# Patient Record
Sex: Female | Born: 1945 | Race: White | Hispanic: No | Marital: Married | Smoking: Never smoker
Health system: Southern US, Community
[De-identification: ages and names within clinical notes are randomized; demographics above are authoritative.]

## PROBLEM LIST (undated history)

## (undated) DIAGNOSIS — Z8619 Personal history of other infectious and parasitic diseases: Secondary | ICD-10-CM

## (undated) DIAGNOSIS — K2940 Chronic atrophic gastritis without bleeding: Secondary | ICD-10-CM

## (undated) DIAGNOSIS — Z79890 Hormone replacement therapy (postmenopausal): Secondary | ICD-10-CM

## (undated) DIAGNOSIS — M8580 Other specified disorders of bone density and structure, unspecified site: Secondary | ICD-10-CM

## (undated) DIAGNOSIS — U071 COVID-19: Secondary | ICD-10-CM

## (undated) DIAGNOSIS — Z9889 Other specified postprocedural states: Secondary | ICD-10-CM

## (undated) DIAGNOSIS — S060XAA Concussion with loss of consciousness status unknown, initial encounter: Secondary | ICD-10-CM

## (undated) DIAGNOSIS — S060X9A Concussion with loss of consciousness of unspecified duration, initial encounter: Secondary | ICD-10-CM

## (undated) DIAGNOSIS — K219 Gastro-esophageal reflux disease without esophagitis: Secondary | ICD-10-CM

## (undated) DIAGNOSIS — H269 Unspecified cataract: Secondary | ICD-10-CM

## (undated) DIAGNOSIS — M810 Age-related osteoporosis without current pathological fracture: Secondary | ICD-10-CM

## (undated) DIAGNOSIS — E785 Hyperlipidemia, unspecified: Secondary | ICD-10-CM

## (undated) DIAGNOSIS — M1990 Unspecified osteoarthritis, unspecified site: Secondary | ICD-10-CM

## (undated) HISTORY — PX: UPPER GI ENDOSCOPY: SHX6162

## (undated) HISTORY — DX: Other specified disorders of bone density and structure, unspecified site: M85.80

## (undated) HISTORY — DX: Hormone replacement therapy: Z79.890

## (undated) HISTORY — DX: Concussion with loss of consciousness of unspecified duration, initial encounter: S06.0X9A

## (undated) HISTORY — PX: EYE SURGERY: SHX253

## (undated) HISTORY — DX: Unspecified osteoarthritis, unspecified site: M19.90

## (undated) HISTORY — DX: Unspecified cataract: H26.9

## (undated) HISTORY — DX: Personal history of other infectious and parasitic diseases: Z86.19

## (undated) HISTORY — PX: TONSILLECTOMY: SUR1361

## (undated) HISTORY — DX: Hyperlipidemia, unspecified: E78.5

## (undated) HISTORY — DX: COVID-19: U07.1

## (undated) HISTORY — PX: LUMBAR DISC SURGERY: SHX700

## (undated) HISTORY — PX: TUBAL LIGATION: SHX77

## (undated) HISTORY — DX: Gastro-esophageal reflux disease without esophagitis: K21.9

## (undated) HISTORY — DX: Concussion with loss of consciousness status unknown, initial encounter: S06.0XAA

## (undated) HISTORY — DX: Chronic atrophic gastritis without bleeding: K29.40

## (undated) HISTORY — DX: Age-related osteoporosis without current pathological fracture: M81.0

---

## 2005-10-01 ENCOUNTER — Emergency Department: Payer: Self-pay | Admitting: Emergency Medicine

## 2005-10-05 ENCOUNTER — Emergency Department: Payer: Self-pay | Admitting: Emergency Medicine

## 2006-03-09 ENCOUNTER — Emergency Department: Payer: Self-pay | Admitting: Emergency Medicine

## 2006-09-24 ENCOUNTER — Ambulatory Visit: Payer: Self-pay

## 2007-09-21 ENCOUNTER — Encounter: Admission: RE | Admit: 2007-09-21 | Discharge: 2007-09-21 | Payer: Self-pay | Admitting: Occupational Medicine

## 2007-09-29 ENCOUNTER — Encounter: Admission: RE | Admit: 2007-09-29 | Discharge: 2007-10-08 | Payer: Self-pay | Admitting: Occupational Medicine

## 2007-10-19 ENCOUNTER — Ambulatory Visit (HOSPITAL_COMMUNITY): Admission: RE | Admit: 2007-10-19 | Discharge: 2007-10-19 | Payer: Self-pay | Admitting: Occupational Medicine

## 2007-11-23 ENCOUNTER — Ambulatory Visit: Payer: Self-pay

## 2007-12-11 ENCOUNTER — Ambulatory Visit: Payer: Self-pay | Admitting: Gastroenterology

## 2007-12-29 ENCOUNTER — Encounter: Admission: RE | Admit: 2007-12-29 | Discharge: 2008-01-25 | Payer: Self-pay | Admitting: Neurosurgery

## 2008-07-07 ENCOUNTER — Ambulatory Visit (HOSPITAL_COMMUNITY): Admission: RE | Admit: 2008-07-07 | Discharge: 2008-07-07 | Payer: Self-pay | Admitting: Neurosurgery

## 2008-08-18 ENCOUNTER — Encounter: Admission: RE | Admit: 2008-08-18 | Discharge: 2008-08-18 | Payer: Self-pay | Admitting: Neurosurgery

## 2008-11-24 ENCOUNTER — Ambulatory Visit: Payer: Self-pay

## 2010-11-07 ENCOUNTER — Ambulatory Visit: Payer: Self-pay | Admitting: Family Medicine

## 2011-07-22 LAB — CREATININE, SERUM: GFR calc Af Amer: >60

## 2011-11-07 ENCOUNTER — Ambulatory Visit: Payer: Self-pay | Admitting: Family Medicine

## 2011-11-07 DIAGNOSIS — M129 Arthropathy, unspecified: Secondary | ICD-10-CM | POA: Diagnosis not present

## 2011-11-07 DIAGNOSIS — R51 Headache: Secondary | ICD-10-CM | POA: Diagnosis not present

## 2011-11-07 DIAGNOSIS — M549 Dorsalgia, unspecified: Secondary | ICD-10-CM | POA: Diagnosis not present

## 2011-11-07 DIAGNOSIS — M542 Cervicalgia: Secondary | ICD-10-CM | POA: Diagnosis not present

## 2011-11-13 DIAGNOSIS — Z79890 Hormone replacement therapy (postmenopausal): Secondary | ICD-10-CM | POA: Diagnosis not present

## 2011-11-13 DIAGNOSIS — R87610 Atypical squamous cells of undetermined significance on cytologic smear of cervix (ASC-US): Secondary | ICD-10-CM | POA: Diagnosis not present

## 2011-11-13 DIAGNOSIS — Z0000 Encounter for general adult medical examination without abnormal findings: Secondary | ICD-10-CM | POA: Diagnosis not present

## 2011-11-13 DIAGNOSIS — Z01419 Encounter for gynecological examination (general) (routine) without abnormal findings: Secondary | ICD-10-CM | POA: Diagnosis not present

## 2011-11-14 ENCOUNTER — Ambulatory Visit: Payer: Self-pay

## 2011-11-14 DIAGNOSIS — Z1231 Encounter for screening mammogram for malignant neoplasm of breast: Secondary | ICD-10-CM | POA: Diagnosis not present

## 2011-11-14 DIAGNOSIS — Z01419 Encounter for gynecological examination (general) (routine) without abnormal findings: Secondary | ICD-10-CM | POA: Diagnosis not present

## 2011-11-14 DIAGNOSIS — R5381 Other malaise: Secondary | ICD-10-CM | POA: Diagnosis not present

## 2011-11-14 DIAGNOSIS — R928 Other abnormal and inconclusive findings on diagnostic imaging of breast: Secondary | ICD-10-CM | POA: Diagnosis not present

## 2011-11-14 DIAGNOSIS — E7800 Pure hypercholesterolemia, unspecified: Secondary | ICD-10-CM | POA: Diagnosis not present

## 2011-11-14 DIAGNOSIS — E559 Vitamin D deficiency, unspecified: Secondary | ICD-10-CM | POA: Diagnosis not present

## 2011-11-18 ENCOUNTER — Encounter: Payer: Self-pay | Admitting: Family Medicine

## 2011-11-22 ENCOUNTER — Encounter: Payer: Self-pay | Admitting: Family Medicine

## 2011-11-22 DIAGNOSIS — IMO0001 Reserved for inherently not codable concepts without codable children: Secondary | ICD-10-CM | POA: Diagnosis not present

## 2011-11-22 DIAGNOSIS — M545 Low back pain, unspecified: Secondary | ICD-10-CM | POA: Diagnosis not present

## 2011-11-22 DIAGNOSIS — R262 Difficulty in walking, not elsewhere classified: Secondary | ICD-10-CM | POA: Diagnosis not present

## 2011-12-03 DIAGNOSIS — R51 Headache: Secondary | ICD-10-CM | POA: Diagnosis not present

## 2011-12-03 DIAGNOSIS — M549 Dorsalgia, unspecified: Secondary | ICD-10-CM | POA: Diagnosis not present

## 2011-12-20 ENCOUNTER — Encounter: Payer: Self-pay | Admitting: Family Medicine

## 2011-12-26 DIAGNOSIS — M549 Dorsalgia, unspecified: Secondary | ICD-10-CM | POA: Diagnosis not present

## 2011-12-26 DIAGNOSIS — E559 Vitamin D deficiency, unspecified: Secondary | ICD-10-CM | POA: Diagnosis not present

## 2011-12-26 DIAGNOSIS — R51 Headache: Secondary | ICD-10-CM | POA: Diagnosis not present

## 2011-12-27 ENCOUNTER — Ambulatory Visit: Payer: Self-pay | Admitting: Family Medicine

## 2011-12-27 DIAGNOSIS — R51 Headache: Secondary | ICD-10-CM | POA: Diagnosis not present

## 2012-01-20 ENCOUNTER — Encounter: Payer: Self-pay | Admitting: Family Medicine

## 2012-02-19 ENCOUNTER — Encounter: Payer: Self-pay | Admitting: Family Medicine

## 2012-03-03 ENCOUNTER — Ambulatory Visit: Payer: Self-pay | Admitting: Family Medicine

## 2012-03-03 DIAGNOSIS — R51 Headache: Secondary | ICD-10-CM | POA: Diagnosis not present

## 2012-03-03 DIAGNOSIS — M5137 Other intervertebral disc degeneration, lumbosacral region: Secondary | ICD-10-CM | POA: Diagnosis not present

## 2012-03-03 DIAGNOSIS — E559 Vitamin D deficiency, unspecified: Secondary | ICD-10-CM | POA: Diagnosis not present

## 2012-03-03 DIAGNOSIS — M549 Dorsalgia, unspecified: Secondary | ICD-10-CM | POA: Diagnosis not present

## 2012-03-03 DIAGNOSIS — M545 Low back pain: Secondary | ICD-10-CM | POA: Diagnosis not present

## 2012-03-05 DIAGNOSIS — L821 Other seborrheic keratosis: Secondary | ICD-10-CM | POA: Diagnosis not present

## 2012-03-05 DIAGNOSIS — L819 Disorder of pigmentation, unspecified: Secondary | ICD-10-CM | POA: Diagnosis not present

## 2012-03-05 DIAGNOSIS — L820 Inflamed seborrheic keratosis: Secondary | ICD-10-CM | POA: Diagnosis not present

## 2012-03-05 DIAGNOSIS — L570 Actinic keratosis: Secondary | ICD-10-CM | POA: Diagnosis not present

## 2012-03-09 DIAGNOSIS — N951 Menopausal and female climacteric states: Secondary | ICD-10-CM | POA: Diagnosis not present

## 2012-03-09 DIAGNOSIS — M899 Disorder of bone, unspecified: Secondary | ICD-10-CM | POA: Diagnosis not present

## 2012-03-09 DIAGNOSIS — E2839 Other primary ovarian failure: Secondary | ICD-10-CM | POA: Diagnosis not present

## 2012-03-21 ENCOUNTER — Encounter: Payer: Self-pay | Admitting: Family Medicine

## 2012-04-20 ENCOUNTER — Encounter: Payer: Self-pay | Admitting: Family Medicine

## 2012-05-21 ENCOUNTER — Encounter: Payer: Self-pay | Admitting: Family Medicine

## 2012-06-29 DIAGNOSIS — M129 Arthropathy, unspecified: Secondary | ICD-10-CM | POA: Diagnosis not present

## 2012-06-29 DIAGNOSIS — Z23 Encounter for immunization: Secondary | ICD-10-CM | POA: Diagnosis not present

## 2012-06-29 DIAGNOSIS — M549 Dorsalgia, unspecified: Secondary | ICD-10-CM | POA: Diagnosis not present

## 2012-06-29 DIAGNOSIS — E559 Vitamin D deficiency, unspecified: Secondary | ICD-10-CM | POA: Diagnosis not present

## 2012-07-06 DIAGNOSIS — Z79899 Other long term (current) drug therapy: Secondary | ICD-10-CM | POA: Diagnosis not present

## 2012-07-06 DIAGNOSIS — R5381 Other malaise: Secondary | ICD-10-CM | POA: Diagnosis not present

## 2012-07-06 DIAGNOSIS — E559 Vitamin D deficiency, unspecified: Secondary | ICD-10-CM | POA: Diagnosis not present

## 2012-07-06 DIAGNOSIS — E7800 Pure hypercholesterolemia, unspecified: Secondary | ICD-10-CM | POA: Diagnosis not present

## 2012-07-06 DIAGNOSIS — R51 Headache: Secondary | ICD-10-CM | POA: Diagnosis not present

## 2012-07-06 DIAGNOSIS — R5383 Other fatigue: Secondary | ICD-10-CM | POA: Diagnosis not present

## 2012-08-05 DIAGNOSIS — L819 Disorder of pigmentation, unspecified: Secondary | ICD-10-CM | POA: Diagnosis not present

## 2012-08-05 DIAGNOSIS — L988 Other specified disorders of the skin and subcutaneous tissue: Secondary | ICD-10-CM | POA: Diagnosis not present

## 2012-08-05 DIAGNOSIS — D485 Neoplasm of uncertain behavior of skin: Secondary | ICD-10-CM | POA: Diagnosis not present

## 2012-08-05 DIAGNOSIS — L821 Other seborrheic keratosis: Secondary | ICD-10-CM | POA: Diagnosis not present

## 2012-12-24 DIAGNOSIS — Z01419 Encounter for gynecological examination (general) (routine) without abnormal findings: Secondary | ICD-10-CM | POA: Diagnosis not present

## 2012-12-24 DIAGNOSIS — Z79890 Hormone replacement therapy (postmenopausal): Secondary | ICD-10-CM | POA: Diagnosis not present

## 2012-12-24 DIAGNOSIS — M899 Disorder of bone, unspecified: Secondary | ICD-10-CM | POA: Diagnosis not present

## 2012-12-24 DIAGNOSIS — M949 Disorder of cartilage, unspecified: Secondary | ICD-10-CM | POA: Diagnosis not present

## 2013-01-21 ENCOUNTER — Ambulatory Visit: Payer: Self-pay

## 2013-01-21 DIAGNOSIS — Z1231 Encounter for screening mammogram for malignant neoplasm of breast: Secondary | ICD-10-CM | POA: Diagnosis not present

## 2013-01-26 DIAGNOSIS — Z0000 Encounter for general adult medical examination without abnormal findings: Secondary | ICD-10-CM | POA: Diagnosis not present

## 2013-01-26 DIAGNOSIS — Z1339 Encounter for screening examination for other mental health and behavioral disorders: Secondary | ICD-10-CM | POA: Diagnosis not present

## 2013-01-26 DIAGNOSIS — Z23 Encounter for immunization: Secondary | ICD-10-CM | POA: Diagnosis not present

## 2013-01-26 DIAGNOSIS — Z1331 Encounter for screening for depression: Secondary | ICD-10-CM | POA: Diagnosis not present

## 2013-03-10 DIAGNOSIS — L570 Actinic keratosis: Secondary | ICD-10-CM | POA: Diagnosis not present

## 2013-03-10 DIAGNOSIS — L819 Disorder of pigmentation, unspecified: Secondary | ICD-10-CM | POA: Diagnosis not present

## 2013-03-10 DIAGNOSIS — L821 Other seborrheic keratosis: Secondary | ICD-10-CM | POA: Diagnosis not present

## 2013-06-10 ENCOUNTER — Ambulatory Visit: Payer: Self-pay | Admitting: Gastroenterology

## 2013-06-10 DIAGNOSIS — K620 Anal polyp: Secondary | ICD-10-CM | POA: Diagnosis not present

## 2013-06-10 DIAGNOSIS — D128 Benign neoplasm of rectum: Secondary | ICD-10-CM | POA: Diagnosis not present

## 2013-06-10 DIAGNOSIS — Z8371 Family history of colonic polyps: Secondary | ICD-10-CM | POA: Diagnosis not present

## 2013-06-10 DIAGNOSIS — Z83719 Family history of colon polyps, unspecified: Secondary | ICD-10-CM | POA: Diagnosis not present

## 2013-06-10 DIAGNOSIS — K648 Other hemorrhoids: Secondary | ICD-10-CM | POA: Diagnosis not present

## 2013-06-10 DIAGNOSIS — Z1211 Encounter for screening for malignant neoplasm of colon: Secondary | ICD-10-CM | POA: Diagnosis not present

## 2013-06-10 DIAGNOSIS — K5730 Diverticulosis of large intestine without perforation or abscess without bleeding: Secondary | ICD-10-CM | POA: Diagnosis not present

## 2013-06-10 LAB — HM COLONOSCOPY

## 2013-06-11 LAB — PATHOLOGY REPORT

## 2013-07-07 DIAGNOSIS — H43819 Vitreous degeneration, unspecified eye: Secondary | ICD-10-CM | POA: Diagnosis not present

## 2013-07-07 DIAGNOSIS — H04129 Dry eye syndrome of unspecified lacrimal gland: Secondary | ICD-10-CM | POA: Diagnosis not present

## 2013-07-07 DIAGNOSIS — H259 Unspecified age-related cataract: Secondary | ICD-10-CM | POA: Diagnosis not present

## 2013-07-07 DIAGNOSIS — H16109 Unspecified superficial keratitis, unspecified eye: Secondary | ICD-10-CM | POA: Diagnosis not present

## 2013-08-10 DIAGNOSIS — Z23 Encounter for immunization: Secondary | ICD-10-CM | POA: Diagnosis not present

## 2013-08-10 DIAGNOSIS — E559 Vitamin D deficiency, unspecified: Secondary | ICD-10-CM | POA: Diagnosis not present

## 2013-08-10 DIAGNOSIS — M549 Dorsalgia, unspecified: Secondary | ICD-10-CM | POA: Diagnosis not present

## 2013-08-10 DIAGNOSIS — R5381 Other malaise: Secondary | ICD-10-CM | POA: Diagnosis not present

## 2013-08-10 DIAGNOSIS — Z79899 Other long term (current) drug therapy: Secondary | ICD-10-CM | POA: Diagnosis not present

## 2013-08-17 DIAGNOSIS — H52219 Irregular astigmatism, unspecified eye: Secondary | ICD-10-CM | POA: Diagnosis not present

## 2013-08-17 DIAGNOSIS — H35379 Puckering of macula, unspecified eye: Secondary | ICD-10-CM | POA: Diagnosis not present

## 2013-08-17 DIAGNOSIS — H259 Unspecified age-related cataract: Secondary | ICD-10-CM | POA: Diagnosis not present

## 2013-08-17 DIAGNOSIS — H04129 Dry eye syndrome of unspecified lacrimal gland: Secondary | ICD-10-CM | POA: Diagnosis not present

## 2013-08-31 DIAGNOSIS — H2510 Age-related nuclear cataract, unspecified eye: Secondary | ICD-10-CM | POA: Diagnosis not present

## 2013-08-31 DIAGNOSIS — H269 Unspecified cataract: Secondary | ICD-10-CM | POA: Diagnosis not present

## 2013-08-31 DIAGNOSIS — H25039 Anterior subcapsular polar age-related cataract, unspecified eye: Secondary | ICD-10-CM | POA: Diagnosis not present

## 2013-08-31 DIAGNOSIS — H25019 Cortical age-related cataract, unspecified eye: Secondary | ICD-10-CM | POA: Diagnosis not present

## 2013-08-31 DIAGNOSIS — Z9889 Other specified postprocedural states: Secondary | ICD-10-CM | POA: Diagnosis not present

## 2013-08-31 DIAGNOSIS — H25049 Posterior subcapsular polar age-related cataract, unspecified eye: Secondary | ICD-10-CM | POA: Diagnosis not present

## 2013-09-08 DIAGNOSIS — D485 Neoplasm of uncertain behavior of skin: Secondary | ICD-10-CM | POA: Diagnosis not present

## 2013-09-08 DIAGNOSIS — L821 Other seborrheic keratosis: Secondary | ICD-10-CM | POA: Diagnosis not present

## 2013-09-08 DIAGNOSIS — L819 Disorder of pigmentation, unspecified: Secondary | ICD-10-CM | POA: Diagnosis not present

## 2013-09-08 DIAGNOSIS — L908 Other atrophic disorders of skin: Secondary | ICD-10-CM | POA: Diagnosis not present

## 2013-12-29 DIAGNOSIS — N952 Postmenopausal atrophic vaginitis: Secondary | ICD-10-CM | POA: Diagnosis not present

## 2013-12-29 DIAGNOSIS — N951 Menopausal and female climacteric states: Secondary | ICD-10-CM | POA: Diagnosis not present

## 2013-12-29 DIAGNOSIS — Z01419 Encounter for gynecological examination (general) (routine) without abnormal findings: Secondary | ICD-10-CM | POA: Diagnosis not present

## 2013-12-30 DIAGNOSIS — H26499 Other secondary cataract, unspecified eye: Secondary | ICD-10-CM | POA: Diagnosis not present

## 2013-12-30 DIAGNOSIS — H2640 Unspecified secondary cataract: Secondary | ICD-10-CM | POA: Diagnosis not present

## 2014-01-19 DIAGNOSIS — L821 Other seborrheic keratosis: Secondary | ICD-10-CM | POA: Diagnosis not present

## 2014-01-19 DIAGNOSIS — L820 Inflamed seborrheic keratosis: Secondary | ICD-10-CM | POA: Diagnosis not present

## 2014-01-19 DIAGNOSIS — L578 Other skin changes due to chronic exposure to nonionizing radiation: Secondary | ICD-10-CM | POA: Diagnosis not present

## 2014-01-19 DIAGNOSIS — L918 Other hypertrophic disorders of the skin: Secondary | ICD-10-CM | POA: Diagnosis not present

## 2014-01-19 DIAGNOSIS — L819 Disorder of pigmentation, unspecified: Secondary | ICD-10-CM | POA: Diagnosis not present

## 2014-01-19 DIAGNOSIS — L908 Other atrophic disorders of skin: Secondary | ICD-10-CM | POA: Diagnosis not present

## 2014-01-19 DIAGNOSIS — L723 Sebaceous cyst: Secondary | ICD-10-CM | POA: Diagnosis not present

## 2014-02-22 DIAGNOSIS — Z23 Encounter for immunization: Secondary | ICD-10-CM | POA: Diagnosis not present

## 2014-02-22 DIAGNOSIS — Z79899 Other long term (current) drug therapy: Secondary | ICD-10-CM | POA: Diagnosis not present

## 2014-02-22 DIAGNOSIS — Z1331 Encounter for screening for depression: Secondary | ICD-10-CM | POA: Diagnosis not present

## 2014-02-22 DIAGNOSIS — M549 Dorsalgia, unspecified: Secondary | ICD-10-CM | POA: Diagnosis not present

## 2014-02-22 DIAGNOSIS — R5383 Other fatigue: Secondary | ICD-10-CM | POA: Diagnosis not present

## 2014-02-22 DIAGNOSIS — R5381 Other malaise: Secondary | ICD-10-CM | POA: Diagnosis not present

## 2014-02-22 DIAGNOSIS — Z1339 Encounter for screening examination for other mental health and behavioral disorders: Secondary | ICD-10-CM | POA: Diagnosis not present

## 2014-02-22 DIAGNOSIS — Z0000 Encounter for general adult medical examination without abnormal findings: Secondary | ICD-10-CM | POA: Diagnosis not present

## 2014-03-24 ENCOUNTER — Ambulatory Visit: Payer: Self-pay | Admitting: Family Medicine

## 2014-03-24 DIAGNOSIS — Z1231 Encounter for screening mammogram for malignant neoplasm of breast: Secondary | ICD-10-CM | POA: Diagnosis not present

## 2014-07-12 DIAGNOSIS — Z1331 Encounter for screening for depression: Secondary | ICD-10-CM | POA: Diagnosis not present

## 2014-07-12 DIAGNOSIS — Z23 Encounter for immunization: Secondary | ICD-10-CM | POA: Diagnosis not present

## 2014-07-12 DIAGNOSIS — Z1339 Encounter for screening examination for other mental health and behavioral disorders: Secondary | ICD-10-CM | POA: Diagnosis not present

## 2014-07-12 DIAGNOSIS — M549 Dorsalgia, unspecified: Secondary | ICD-10-CM | POA: Diagnosis not present

## 2014-07-12 DIAGNOSIS — Z0000 Encounter for general adult medical examination without abnormal findings: Secondary | ICD-10-CM | POA: Diagnosis not present

## 2014-07-21 DIAGNOSIS — L578 Other skin changes due to chronic exposure to nonionizing radiation: Secondary | ICD-10-CM | POA: Diagnosis not present

## 2014-07-21 DIAGNOSIS — L720 Epidermal cyst: Secondary | ICD-10-CM | POA: Diagnosis not present

## 2014-07-21 DIAGNOSIS — L700 Acne vulgaris: Secondary | ICD-10-CM | POA: Diagnosis not present

## 2014-08-12 DIAGNOSIS — H43813 Vitreous degeneration, bilateral: Secondary | ICD-10-CM | POA: Diagnosis not present

## 2014-08-12 DIAGNOSIS — H31001 Unspecified chorioretinal scars, right eye: Secondary | ICD-10-CM | POA: Diagnosis not present

## 2014-08-12 DIAGNOSIS — H2512 Age-related nuclear cataract, left eye: Secondary | ICD-10-CM | POA: Diagnosis not present

## 2014-08-12 DIAGNOSIS — H35372 Puckering of macula, left eye: Secondary | ICD-10-CM | POA: Diagnosis not present

## 2014-08-23 DIAGNOSIS — E559 Vitamin D deficiency, unspecified: Secondary | ICD-10-CM | POA: Diagnosis not present

## 2014-08-23 DIAGNOSIS — E780 Pure hypercholesterolemia: Secondary | ICD-10-CM | POA: Diagnosis not present

## 2014-08-23 DIAGNOSIS — G3184 Mild cognitive impairment, so stated: Secondary | ICD-10-CM | POA: Diagnosis not present

## 2014-08-23 DIAGNOSIS — M549 Dorsalgia, unspecified: Secondary | ICD-10-CM | POA: Diagnosis not present

## 2014-08-23 DIAGNOSIS — R5383 Other fatigue: Secondary | ICD-10-CM | POA: Diagnosis not present

## 2014-10-25 DIAGNOSIS — H25012 Cortical age-related cataract, left eye: Secondary | ICD-10-CM | POA: Diagnosis not present

## 2014-10-25 DIAGNOSIS — H2512 Age-related nuclear cataract, left eye: Secondary | ICD-10-CM | POA: Diagnosis not present

## 2014-10-25 DIAGNOSIS — H35372 Puckering of macula, left eye: Secondary | ICD-10-CM | POA: Diagnosis not present

## 2014-10-25 DIAGNOSIS — H25032 Anterior subcapsular polar age-related cataract, left eye: Secondary | ICD-10-CM | POA: Diagnosis not present

## 2014-10-25 DIAGNOSIS — H25042 Posterior subcapsular polar age-related cataract, left eye: Secondary | ICD-10-CM | POA: Diagnosis not present

## 2014-10-25 DIAGNOSIS — H25812 Combined forms of age-related cataract, left eye: Secondary | ICD-10-CM | POA: Diagnosis not present

## 2014-10-25 DIAGNOSIS — H35342 Macular cyst, hole, or pseudohole, left eye: Secondary | ICD-10-CM | POA: Diagnosis not present

## 2014-12-05 DIAGNOSIS — H35372 Puckering of macula, left eye: Secondary | ICD-10-CM | POA: Diagnosis not present

## 2015-01-10 DIAGNOSIS — G3184 Mild cognitive impairment, so stated: Secondary | ICD-10-CM | POA: Diagnosis not present

## 2015-01-10 DIAGNOSIS — J069 Acute upper respiratory infection, unspecified: Secondary | ICD-10-CM | POA: Diagnosis not present

## 2015-01-10 DIAGNOSIS — R05 Cough: Secondary | ICD-10-CM | POA: Diagnosis not present

## 2015-02-03 DIAGNOSIS — L818 Other specified disorders of pigmentation: Secondary | ICD-10-CM | POA: Diagnosis not present

## 2015-02-03 DIAGNOSIS — L820 Inflamed seborrheic keratosis: Secondary | ICD-10-CM | POA: Diagnosis not present

## 2015-02-03 DIAGNOSIS — L720 Epidermal cyst: Secondary | ICD-10-CM | POA: Diagnosis not present

## 2015-03-09 DIAGNOSIS — R05 Cough: Secondary | ICD-10-CM | POA: Diagnosis not present

## 2015-03-09 DIAGNOSIS — G3184 Mild cognitive impairment, so stated: Secondary | ICD-10-CM | POA: Diagnosis not present

## 2015-03-09 DIAGNOSIS — Z0000 Encounter for general adult medical examination without abnormal findings: Secondary | ICD-10-CM | POA: Diagnosis not present

## 2015-04-17 ENCOUNTER — Other Ambulatory Visit: Payer: Self-pay | Admitting: Family Medicine

## 2015-04-17 DIAGNOSIS — Z1231 Encounter for screening mammogram for malignant neoplasm of breast: Secondary | ICD-10-CM

## 2015-04-18 ENCOUNTER — Ambulatory Visit (INDEPENDENT_AMBULATORY_CARE_PROVIDER_SITE_OTHER): Payer: Medicare Other | Admitting: Obstetrics and Gynecology

## 2015-04-18 ENCOUNTER — Ambulatory Visit
Admission: RE | Admit: 2015-04-18 | Discharge: 2015-04-18 | Disposition: A | Discharge status: Home / Self Care | Payer: Medicare Other | Source: Ambulatory Visit | Attending: Family Medicine | Admitting: Family Medicine

## 2015-04-18 ENCOUNTER — Encounter: Payer: Self-pay | Admitting: Obstetrics and Gynecology

## 2015-04-18 ENCOUNTER — Other Ambulatory Visit: Payer: Self-pay | Admitting: Family Medicine

## 2015-04-18 VITALS — BP 109/67 | HR 60 | Ht 63.0 in | Wt 117.8 lb

## 2015-04-18 DIAGNOSIS — Z01419 Encounter for gynecological examination (general) (routine) without abnormal findings: Secondary | ICD-10-CM | POA: Diagnosis not present

## 2015-04-18 DIAGNOSIS — N951 Menopausal and female climacteric states: Secondary | ICD-10-CM | POA: Diagnosis not present

## 2015-04-18 DIAGNOSIS — Z79890 Hormone replacement therapy (postmenopausal): Secondary | ICD-10-CM

## 2015-04-18 DIAGNOSIS — Z8742 Personal history of other diseases of the female genital tract: Secondary | ICD-10-CM | POA: Insufficient documentation

## 2015-04-18 DIAGNOSIS — Z1239 Encounter for other screening for malignant neoplasm of breast: Secondary | ICD-10-CM | POA: Diagnosis not present

## 2015-04-18 DIAGNOSIS — Z1231 Encounter for screening mammogram for malignant neoplasm of breast: Secondary | ICD-10-CM

## 2015-04-18 MED ORDER — CONJ ESTROG-MEDROXYPROGEST ACE 0.3-1.5 MG PO TABS: 1.0000 | ORAL_TABLET | Freq: Every day | ORAL | Status: DC

## 2015-04-18 NOTE — Progress Notes (Signed)
GYN Annual Exam  Subjective:    Alexis Medina is a 69 y.o. G66P1011 female who presents to establish care, and for annual exam. The patient has no complaints today. The patient is not sexually active. GYN screening history: last pap: approximate date 3 years ago and was normal and last mammogram: approximate date 2015 and was normal. The patient is taking hormone replacement therapy. Patient denies post-menopausal vaginal bleeding (however does have a history of PMB ~ 5 years ago, with benign workup) . The patient wears seatbelts: yes. The patient participates in regular exercise: no. Has the patient ever been transfused or tattooed?: no. The patient reports that there is not domestic violence in her life.   Menstrual History: Obstetric History   G2   P1   T1   P0   A1   TAB0   SAB1   E0   M0   L1     # Outcome Date GA Lbr Len/2nd Weight Sex Delivery Anes PTL Lv  2 Term 11/17/82   7 lb 10 oz (3.459 kg) M Vag-Spont   Y  1 SAB      SAB   FD     Menarche age: 39 or 64 (patient cannot recall exactly)  No LMP recorded. Patient is postmenopausal. Last colonoscopy: 2015, 1 polyp noted but otherwise normal.   Review of Systems Constitutional: negative for chills, fatigue, fevers and sweats Eyes: negative for irritation, redness and visual disturbance Ears, nose, mouth, throat, and face: negative for hearing loss, nasal congestion, snoring and tinnitus Respiratory: negative for asthma, cough, sputum Cardiovascular: negative for chest pain, dyspnea, exertional chest pressure/discomfort, irregular heart beat, palpitations and syncope Gastrointestinal: negative for abdominal pain, change in bowel habits, nausea and vomiting Genitourinary: negative for abnormal menstrual periods, genital lesions, sexual problems and vaginal discharge, dysuria and urinary incontinence Integument/breast: negative for breast lump, breast tenderness and nipple discharge Hematologic/lymphatic: negative for bleeding and  easy bruising Musculoskeletal:negative for back pain and muscle weakness Neurological: negative for dizziness, headaches, vertigo and weakness Endocrine: negative for diabetic symptoms including polydipsia, polyuria and skin dryness Allergic/Immunologic: negative for hay fever and urticaria     Objective:    BP 109/67 mmHg  Pulse 60  Ht 5\' 3"  (1.6 m)  Wt 117 lb 12.8 oz (53.434 kg)  BMI 20.87 kg/m2  General Appearance:    Alert, cooperative, no distress, appears stated age  Head:    Normocephalic, without obvious abnormality, atraumatic  Eyes:    PERRL, conjunctiva/corneas clear, EOM's intact, both eyes  Ears:    Normal external ear canals, both ears  Nose:   Nares normal, septum midline, mucosa normal, no drainage or sinus tenderness  Throat:   Lips, mucosa, and tongue normal; teeth and gums normal  Neck:   Supple, symmetrical, trachea midline, no adenopathy; thyroid: no enlargement/tenderness/nodules; no carotid   bruit or JVD  Back:     Symmetric, no curvature, ROM normal, no CVA tenderness  Lungs:     Clear to auscultation bilaterally, respirations unlabored  Chest Wall:    No tenderness or deformity   Heart:    Regular rate and rhythm, S1 and S2 normal, no murmur, rub or gallop  Breast Exam:    No tenderness, masses, or nipple abnormality  Abdomen:     Soft, non-tender, bowel sounds active all four quadrants, no masses, no organomegaly  Genitalia:    Normal female without lesion, discharge or tenderness  Rectal:    Normal external sphincter. No hemmorhoids  present. Internal exam not done.   Extremities:   Extremities normal, atraumatic, no cyanosis or edema  Pulses:   2+ and symmetric all extremities  Skin:   Skin color with multiple hyperpigmented areas around neck/face and legs (sun damage).  Texture, turgor normal, no rashes or lesions.  Lymph nodes:   Cervical, supraclavicular, and axillary nodes normal  Neurologic:   CNII-XII intact, normal strength, sensation and reflexes     throughout      Assessment:    Normal gyn exam Hormone replacement therapy Menopause    Plan:    All questions answered. Breast self exam technique reviewed and patient encouraged to perform self-exam monthly. Discussed healthy lifestyle modifications. Mammogram.   Menopause Management : Non-hormonal Menopausal Symptom management strategies reviewed. Hormone Replacement Therapy benefits and risks reviewed. Patient reports attempting to wean 3 or 4 times before in the past with worsening rebound symptoms. The patient is accepting of all risks of continued HRT use. Hormone Replacement Therapy is refilled.  Advised to alert MD of any episodes of vaginal bleeding.  Sees Dermatologist annually.  RTC in 1 year for medication refill and in 2 years for next annual exam (Medicare coverage).     Rubie Maid, MD [facility] 04/18/2015 3:53 PM

## 2015-04-19 ENCOUNTER — Telehealth: Payer: Self-pay | Admitting: Obstetrics and Gynecology

## 2015-04-19 DIAGNOSIS — Z79890 Hormone replacement therapy (postmenopausal): Principal | ICD-10-CM

## 2015-04-19 DIAGNOSIS — N951 Menopausal and female climacteric states: Secondary | ICD-10-CM

## 2015-04-19 NOTE — Telephone Encounter (Signed)
[facility] but it was sent in for 30day supply but she needs it to be the 90 days supply in order for insurance to pay for it. Can you resend in new rx for pt

## 2015-04-20 ENCOUNTER — Other Ambulatory Visit: Payer: Self-pay

## 2015-04-20 MED ORDER — IBUPROFEN 800 MG PO TABS: 800.0000 mg | ORAL_TABLET | Freq: Three times a day (TID) | ORAL | Status: DC | PRN

## 2015-04-20 NOTE — Telephone Encounter (Signed)
Fax from [employer]. This is Dr. Alben Spittle patient thank you=aa

## 2015-04-21 MED ORDER — CONJ ESTROG-MEDROXYPROGEST ACE 0.3-1.5 MG PO TABS: 1.0000 | ORAL_TABLET | Freq: Every day | ORAL | Status: DC

## 2015-04-21 NOTE — Telephone Encounter (Signed)
Please inform pt that 90 supply was sent in and apologize for the inconvenience. Thanks

## 2015-04-25 NOTE — Telephone Encounter (Signed)
PT AWARE  

## 2015-05-04 DIAGNOSIS — H35372 Puckering of macula, left eye: Secondary | ICD-10-CM | POA: Diagnosis not present

## 2015-05-04 DIAGNOSIS — H43813 Vitreous degeneration, bilateral: Secondary | ICD-10-CM | POA: Diagnosis not present

## 2015-05-04 DIAGNOSIS — H35413 Lattice degeneration of retina, bilateral: Secondary | ICD-10-CM | POA: Diagnosis not present

## 2015-05-23 ENCOUNTER — Encounter: Payer: Medicare Other | Admitting: Obstetrics and Gynecology

## 2015-08-19 ENCOUNTER — Ambulatory Visit (INDEPENDENT_AMBULATORY_CARE_PROVIDER_SITE_OTHER): Payer: Medicare Other

## 2015-08-19 DIAGNOSIS — Z23 Encounter for immunization: Secondary | ICD-10-CM | POA: Diagnosis not present

## 2015-11-23 DIAGNOSIS — H35372 Puckering of macula, left eye: Secondary | ICD-10-CM | POA: Diagnosis not present

## 2015-11-23 DIAGNOSIS — H43813 Vitreous degeneration, bilateral: Secondary | ICD-10-CM | POA: Diagnosis not present

## 2015-12-08 DIAGNOSIS — H0012 Chalazion right lower eyelid: Secondary | ICD-10-CM | POA: Diagnosis not present

## 2015-12-08 DIAGNOSIS — H04123 Dry eye syndrome of bilateral lacrimal glands: Secondary | ICD-10-CM | POA: Diagnosis not present

## 2015-12-08 DIAGNOSIS — H26492 Other secondary cataract, left eye: Secondary | ICD-10-CM | POA: Diagnosis not present

## 2015-12-08 DIAGNOSIS — H43813 Vitreous degeneration, bilateral: Secondary | ICD-10-CM | POA: Diagnosis not present

## 2015-12-13 DIAGNOSIS — H26492 Other secondary cataract, left eye: Secondary | ICD-10-CM | POA: Diagnosis not present

## 2016-01-18 DIAGNOSIS — H04122 Dry eye syndrome of left lacrimal gland: Secondary | ICD-10-CM | POA: Diagnosis not present

## 2016-01-18 DIAGNOSIS — H04121 Dry eye syndrome of right lacrimal gland: Secondary | ICD-10-CM | POA: Diagnosis not present

## 2016-03-06 DIAGNOSIS — L820 Inflamed seborrheic keratosis: Secondary | ICD-10-CM | POA: Diagnosis not present

## 2016-03-06 DIAGNOSIS — L988 Other specified disorders of the skin and subcutaneous tissue: Secondary | ICD-10-CM | POA: Diagnosis not present

## 2016-03-06 DIAGNOSIS — L814 Other melanin hyperpigmentation: Secondary | ICD-10-CM | POA: Diagnosis not present

## 2016-03-06 DIAGNOSIS — L821 Other seborrheic keratosis: Secondary | ICD-10-CM | POA: Diagnosis not present

## 2016-04-08 ENCOUNTER — Other Ambulatory Visit: Payer: Self-pay | Admitting: Family Medicine

## 2016-04-18 ENCOUNTER — Encounter: Payer: Self-pay | Admitting: Obstetrics and Gynecology

## 2016-04-18 ENCOUNTER — Ambulatory Visit (INDEPENDENT_AMBULATORY_CARE_PROVIDER_SITE_OTHER): Payer: Medicare Other | Admitting: Obstetrics and Gynecology

## 2016-04-18 VITALS — BP 122/72 | HR 66 | Ht 63.0 in | Wt 119.6 lb

## 2016-04-18 DIAGNOSIS — Z79890 Hormone replacement therapy (postmenopausal): Secondary | ICD-10-CM

## 2016-04-18 DIAGNOSIS — Z1239 Encounter for other screening for malignant neoplasm of breast: Secondary | ICD-10-CM

## 2016-04-18 DIAGNOSIS — N950 Postmenopausal bleeding: Secondary | ICD-10-CM | POA: Diagnosis not present

## 2016-04-20 NOTE — Progress Notes (Signed)
   GYNECOLOGY CLINIC PROGRESS NOTE  Subjective:    Alexis Medina is a 70 y.o. G58P1011  post-menopausal female who presents for concerns regarding vaginal bleeding. She has been menopausal for over 15 years. Currently on continuous HRT (Prempro) and has been on this regimen for several years. Bleeding is described as spotting and has occurred several times over 4-5 weeks.  Has had no further bleeding in the past 2-3 weeks.  Thinks it may be due to stress as her husband has recently had to learn to self cath, and she has had to help.  Other menopausal symptoms include: occasional intermittent pelvic cramping.   Patient notes that she has had a h/o PMB in the past (~ 4-5 years ago with benign workup).  Reports having tried to wean from HRT several months ago (at recommendations from PCP), got down to q 3 days of taking tablets but began noting moderate symptoms of hot flushes, mood changes, so resumed daily dosing.     The following portions of the patient's history were reviewed and updated as appropriate: allergies, current medications, past family history, past medical history, past social history, past surgical history and problem list.  Review of Systems Pertinent items noted in HPI and remainder of comprehensive ROS otherwise negative.    Objective:    BP 122/72 mmHg  Pulse 66  Ht 5\' 3"  (1.6 m)  Wt 119 lb 9.6 oz (54.25 kg)  BMI 21.19 kg/m2  General appearance: alert and no distress Abdomen: soft, non-tender; bowel sounds normal; no masses,  no organomegaly  Breasts: breasts appear normal, no suspicious masses, no skin or nipple changes or axillary nodes. Pelvic: external genitalia normal, rectovaginal septum normal.  Vagina with small amount of thin white discharge without odor. No blood in vaginal vault. Mild vaginal atrophy present.  Cervix normal appearing, no lesions and no motion tenderness, stenotic os.  Uterus mobile, nontender, normal shape and size.  Adnexae non-palpable,  nontender bilaterally.  Extremities: extremities normal, atraumatic, no cyanosis or edema Neurologic: Grossly normal   Assessment:    Postmenopausal bleeding   Currently on HRT. Need for mammogram  Plan:    Diagnosis explained in detail, including differential.    Discussed need for further evaluation with ultrasound, and possible endometrial biopsy based on results.  To have ultrasound within 1 week. Will notify patient of ultrasound results by phone.   Patient to hold taking HRT medication until evaluation complete.  Patient notes that if she is able to resume HRT, she will need a refill.  Patient notes that she needs a mammogram this year.  Will order.    Rubie Maid, MD [facility]

## 2016-04-24 ENCOUNTER — Telehealth: Payer: Self-pay

## 2016-04-24 NOTE — Telephone Encounter (Signed)
[facility] for refill on Tramadol 50 mg 1 PO BID prn

## 2016-04-25 ENCOUNTER — Other Ambulatory Visit: Payer: Self-pay

## 2016-04-25 MED ORDER — TRAMADOL HCL 50 MG PO TABS: 50.0000 mg | ORAL_TABLET | Freq: Two times a day (BID) | ORAL | Status: DC | PRN

## 2016-04-25 NOTE — Telephone Encounter (Signed)
Ok--3 months--1 rf.

## 2016-04-26 ENCOUNTER — Ambulatory Visit (INDEPENDENT_AMBULATORY_CARE_PROVIDER_SITE_OTHER): Payer: Medicare Other

## 2016-04-26 DIAGNOSIS — N950 Postmenopausal bleeding: Secondary | ICD-10-CM | POA: Diagnosis not present

## 2016-05-06 ENCOUNTER — Telehealth: Payer: Self-pay | Admitting: Obstetrics and Gynecology

## 2016-05-06 DIAGNOSIS — Z79890 Hormone replacement therapy (postmenopausal): Principal | ICD-10-CM

## 2016-05-06 DIAGNOSIS — N951 Menopausal and female climacteric states: Secondary | ICD-10-CM

## 2016-05-06 NOTE — Telephone Encounter (Signed)
7/7 this pt had a vag [place] and she was calling for results

## 2016-05-08 NOTE — Telephone Encounter (Signed)
Patient called back requesting u/s results. She can be reached on her cell [phone].Thanks

## 2016-05-09 NOTE — Telephone Encounter (Signed)
pt has called 3 times now and not got a call back about her results. She said she wants to know results and she said that you were going to put her back on primpro.

## 2016-05-09 NOTE — Telephone Encounter (Signed)
Please advise 

## 2016-05-10 MED ORDER — CONJ ESTROG-MEDROXYPROGEST ACE 0.3-1.5 MG PO TABS: 1.0000 | ORAL_TABLET | Freq: Every day | ORAL | Status: DC

## 2016-05-10 NOTE — Telephone Encounter (Signed)
Contacted patient, informed her of results of ultrasound (benign findings, no concerns for malignancy with episode of PMB).  She can resume her Prempro.  Needs refill. 3 month supply with refills sent through [facility].    Rubie Maid, MD [facility]

## 2016-05-10 NOTE — Telephone Encounter (Signed)
Yes.  Please apologize to her for me.  I was planning to get back with her after clinic yesterday but had to leave right after for a delivery.  Her ultrasound was normal, lining of uterus was not thickened, and she had 2 very small fibroids that should not be causing problems.  She can resume her Prempro.    Rubie Maid, MD [facility]

## 2016-05-14 ENCOUNTER — Other Ambulatory Visit: Payer: Self-pay | Admitting: Family Medicine

## 2016-05-14 DIAGNOSIS — Z1231 Encounter for screening mammogram for malignant neoplasm of breast: Secondary | ICD-10-CM

## 2016-05-28 ENCOUNTER — Ambulatory Visit
Admission: RE | Admit: 2016-05-28 | Discharge: 2016-05-28 | Disposition: A | Discharge status: Home / Self Care | Payer: Medicare Other | Source: Ambulatory Visit | Attending: Family Medicine | Admitting: Family Medicine

## 2016-05-28 ENCOUNTER — Other Ambulatory Visit: Payer: Self-pay | Admitting: Family Medicine

## 2016-05-28 DIAGNOSIS — Z1231 Encounter for screening mammogram for malignant neoplasm of breast: Secondary | ICD-10-CM | POA: Insufficient documentation

## 2016-06-12 ENCOUNTER — Other Ambulatory Visit: Payer: Self-pay

## 2016-06-12 MED ORDER — IBUPROFEN 800 MG PO TABS: 800.0000 mg | ORAL_TABLET | Freq: Three times a day (TID) | ORAL | 3 refills | Status: DC | PRN

## 2016-06-12 NOTE — Telephone Encounter (Signed)
Refill request from [facility] for refill on Ibuprofen-aa

## 2016-06-18 ENCOUNTER — Other Ambulatory Visit: Payer: Self-pay

## 2016-07-10 ENCOUNTER — Ambulatory Visit (INDEPENDENT_AMBULATORY_CARE_PROVIDER_SITE_OTHER): Payer: Medicare Other | Admitting: Family Medicine

## 2016-07-10 ENCOUNTER — Encounter: Payer: Self-pay | Admitting: Family Medicine

## 2016-07-10 VITALS — BP 136/76 | HR 60 | Temp 97.8°F | Resp 16 | Wt 120.0 lb

## 2016-07-10 DIAGNOSIS — K219 Gastro-esophageal reflux disease without esophagitis: Secondary | ICD-10-CM

## 2016-07-10 DIAGNOSIS — M545 Low back pain, unspecified: Secondary | ICD-10-CM

## 2016-07-10 DIAGNOSIS — R5383 Other fatigue: Secondary | ICD-10-CM | POA: Diagnosis not present

## 2016-07-10 DIAGNOSIS — M79641 Pain in right hand: Secondary | ICD-10-CM

## 2016-07-10 DIAGNOSIS — M1990 Unspecified osteoarthritis, unspecified site: Secondary | ICD-10-CM

## 2016-07-10 DIAGNOSIS — R079 Chest pain, unspecified: Secondary | ICD-10-CM

## 2016-07-10 MED ORDER — OMEPRAZOLE 20 MG PO CPDR: 20.0000 mg | DELAYED_RELEASE_CAPSULE | Freq: Every day | ORAL | 5 refills | Status: DC

## 2016-07-10 NOTE — Progress Notes (Signed)
Patient: Alexis Medina Female    DOB: 02/04/1946   70 y.o.   MRN: [id number] Visit Date: 07/10/2016  Today's Provider: [name], MD   Chief Complaint  Patient presents with  . Hand Pain    Left thumb comes and goes/ Swelling some  . Back Pain    History of DDD L4-L5; Steadly worsening.  Not doing to exercises as much.   . Fatigue    For about 2-3 weeks  . Chest Pain    Worse when stressed/ anxious; but now happening everyday.     Subjective:    Back Pain  This is a chronic problem. The problem has been gradually worsening since onset. The pain is present in the lumbar spine. The pain does not radiate. Exacerbated by: Standing or sitting too long.  Associated symptoms include chest pain. Pertinent negatives include no abdominal pain, fever, headaches, numbness or weakness. She has tried NSAIDs (Tramadol) for the symptoms. The treatment provided significant relief.  Hand Pain   The incident occurred more than 1 week ago. There was no injury mechanism. The pain is present in the left hand (Mostly in her thumb area.). The quality of the pain is described as aching and stabbing. The pain does not radiate. Associated symptoms include chest pain. Pertinent negatives include no numbness. The symptoms are aggravated by movement and palpation. She has tried NSAIDs for the symptoms. The treatment provided moderate relief.  Chest Pain   This is a new problem. The current episode started in the past 7 days. The onset quality is gradual. The problem has been gradually worsening. The quality of the pain is described as pressure, squeezing and crushing. The pain does not radiate. Associated symptoms include back pain and nausea. Pertinent negatives include no abdominal pain, cough, diaphoresis, dizziness, fever, headaches, numbness, palpitations, shortness of breath, vomiting or weakness.  Pertinent negatives for past medical history include no seizures.      No Known  Allergies   Current Outpatient Prescriptions:  .  cholecalciferol (VITAMIN D) 1000 UNITS tablet, Take 1,000 Units by mouth daily., Disp: , Rfl:  .  estrogen, conjugated,-medroxyprogesterone (PREMPRO) 0.3-1.5 MG tablet, Take 1 tablet by mouth daily., Disp: 90 tablet, Rfl: 4 .  ibuprofen (ADVIL,MOTRIN) 800 MG tablet, Take 1 tablet (800 mg total) by mouth every 8 (eight) hours as needed., Disp: 90 tablet, Rfl: 3 .  Multiple Vitamin (MULTIVITAMIN) capsule, Take 1 capsule by mouth daily., Disp: , Rfl:  .  sertraline (ZOLOFT) 50 MG tablet, TAKE 1 TABLET DAILY AS DIRECTED, Disp: , Rfl:  .  tazarotene (AVAGE) 0.1 % cream, , Disp: , Rfl:  .  traMADol (ULTRAM) 50 MG tablet, Take 1 tablet (50 mg total) by mouth 2 (two) times daily as needed., Disp: 180 tablet, Rfl: 1 .  VAYACOG 100-19.5-6.5 MG CAPS, TAKE ONE CAPSULE BY MOUTH ONCE DAILY, Disp: 30 capsule, Rfl: 12  Review of Systems  Constitutional: Positive for activity change and fatigue. Negative for appetite change, chills, diaphoresis, fever and unexpected weight change.  Respiratory: Negative for apnea, cough, choking, chest tightness, shortness of breath, wheezing and stridor.   Cardiovascular: Positive for chest pain. Negative for palpitations and leg swelling.  Gastrointestinal: Positive for nausea. Negative for abdominal distention, abdominal pain, anal bleeding, blood in stool, constipation, diarrhea, rectal pain and vomiting.  Endocrine: Negative for cold intolerance, heat intolerance, polydipsia, polyphagia and polyuria.  Musculoskeletal: Positive for arthralgias, back pain, joint swelling and myalgias. Negative  for gait problem, neck pain and neck stiffness.  Neurological: Positive for tremors (Hands tremor in the mornings; but this is a chronic issue. ). Negative for dizziness, seizures, syncope, speech difficulty, weakness, light-headedness, numbness and headaches.  Psychiatric/Behavioral: Positive for sleep disturbance (Has been getting up  several times a night. ). Negative for agitation, behavioral problems, confusion, decreased concentration, dysphoric mood, hallucinations, self-injury and suicidal ideas. The patient is not nervous/anxious and is not hyperactive.     Social History  Substance Use Topics  . Smoking status: Never Smoker  . Smokeless tobacco: Never Used  . Alcohol use No   Objective:   BP 136/76 (BP Location: Right Arm, Patient Position: Sitting, Cuff Size: Normal)   Pulse 60   Temp 97.8 F (36.6 C) (Oral)   Resp 16   Wt 120 lb (54.4 kg)   BMI 21.26 kg/m   Physical Exam  Constitutional: She is oriented to person, place, and time. She appears well-developed and well-nourished.  HENT:  Head: Normocephalic and atraumatic.  Right Ear: External ear normal.  Left Ear: External ear normal.  Nose: Nose normal.  Eyes: Conjunctivae are normal.  Neck: No thyromegaly present.  Cardiovascular: Normal rate, regular rhythm, normal heart sounds and intact distal pulses.   Pulmonary/Chest: Effort normal and breath sounds normal.  Abdominal: Soft. Bowel sounds are normal.  Lymphadenopathy:    She has no cervical adenopathy.  Neurological: She is alert and oriented to person, place, and time. She has normal reflexes.  Skin: Skin is warm and dry.  Psychiatric: She has a normal mood and affect. Her behavior is normal. Judgment and thought content normal.        Assessment & Plan:     1. Other fatigue  - EKG 12-Lead - Comprehensive metabolic panel - TSH - CBC with Differential/Platelet  2. Pain of right hand   3. Chest pain, unspecified chest pain type  - EKG 12-Lead - DG Chest 2 View; Future - Lipid panel - Comprehensive metabolic panel - TSH  4. Midline low back pain without sciatica   5. Gastroesophageal reflux disease without esophagitis  - omeprazole (PRILOSEC) 20 MG capsule; Take 1 capsule (20 mg total) by mouth daily.  Dispense: 30 capsule; Refill: 5  6. Arthritis  - DG Hand  Complete Left; Future - Sed Rate (ESR)      I have done the exam and reviewed the above chart and it is accurate to the best of my knowledge.  Jerris Keltz Cranford Mon, MD  [facility]

## 2016-07-11 DIAGNOSIS — R5383 Other fatigue: Secondary | ICD-10-CM | POA: Diagnosis not present

## 2016-07-11 DIAGNOSIS — M1990 Unspecified osteoarthritis, unspecified site: Secondary | ICD-10-CM | POA: Diagnosis not present

## 2016-07-11 DIAGNOSIS — R079 Chest pain, unspecified: Secondary | ICD-10-CM | POA: Diagnosis not present

## 2016-07-12 ENCOUNTER — Ambulatory Visit
Admission: RE | Admit: 2016-07-12 | Discharge: 2016-07-12 | Disposition: A | Discharge status: Home / Self Care | Payer: Medicare Other | Source: Ambulatory Visit | Attending: Family Medicine | Admitting: Family Medicine

## 2016-07-12 ENCOUNTER — Telehealth: Payer: Self-pay

## 2016-07-12 DIAGNOSIS — M1812 Unilateral primary osteoarthritis of first carpometacarpal joint, left hand: Secondary | ICD-10-CM | POA: Insufficient documentation

## 2016-07-12 DIAGNOSIS — R079 Chest pain, unspecified: Secondary | ICD-10-CM | POA: Insufficient documentation

## 2016-07-12 DIAGNOSIS — M1990 Unspecified osteoarthritis, unspecified site: Secondary | ICD-10-CM

## 2016-07-12 LAB — CBC WITH DIFFERENTIAL/PLATELET
Basophils Absolute: 0 10*3/uL (ref 0.0–0.2)
Basos: 1 %
EOS (ABSOLUTE): 0.1 10*3/uL (ref 0.0–0.4)
EOS: 2 %
HEMATOCRIT: 37.7 % (ref 34.0–46.6)
Hemoglobin: 12.8 g/dL (ref 11.1–15.9)
IMMATURE GRANULOCYTES: 0 %
Immature Grans (Abs): 0 10*3/uL (ref 0.0–0.1)
LYMPHS ABS: 2.6 10*3/uL (ref 0.7–3.1)
Lymphs: 45 %
MCH: 30.7 pg (ref 26.6–33.0)
MCHC: 34 g/dL (ref 31.5–35.7)
MCV: 90 fL (ref 79–97)
MONOS ABS: 0.4 10*3/uL (ref 0.1–0.9)
Monocytes: 8 %
NEUTROS PCT: 44 %
Neutrophils Absolute: 2.5 10*3/uL (ref 1.4–7.0)
PLATELETS: 232 10*3/uL (ref 150–379)
RBC: 4.17 x10E6/uL (ref 3.77–5.28)
RDW: 13.7 % (ref 12.3–15.4)
WBC: 5.6 10*3/uL (ref 3.4–10.8)

## 2016-07-12 LAB — COMPREHENSIVE METABOLIC PANEL
A/G RATIO: 1.6 (ref 1.2–2.2)
ALK PHOS: 53 IU/L (ref 39–117)
ALT: 15 IU/L (ref 0–32)
AST: 27 IU/L (ref 0–40)
Albumin: 4.3 g/dL (ref 3.5–4.8)
BILIRUBIN TOTAL: 0.6 mg/dL (ref 0.0–1.2)
BUN/Creatinine Ratio: 11 — ABNORMAL LOW (ref 12–28)
BUN: 9 mg/dL (ref 8–27)
CALCIUM: 9.6 mg/dL (ref 8.7–10.3)
CHLORIDE: 97 mmol/L (ref 96–106)
CO2: 22 mmol/L (ref 18–29)
Creatinine, Ser: 0.84 mg/dL (ref 0.57–1.00)
GFR calc Af Amer: 81 mL/min/{1.73_m2} (ref >59)
GFR, EST NON AFRICAN AMERICAN: 71 mL/min/{1.73_m2} (ref >59)
GLOBULIN, TOTAL: 2.7 g/dL (ref 1.5–4.5)
Glucose: 75 mg/dL (ref 65–99)
POTASSIUM: 4.5 mmol/L (ref 3.5–5.2)
SODIUM: 136 mmol/L (ref 134–144)
Total Protein: 7 g/dL (ref 6.0–8.5)

## 2016-07-12 LAB — TSH: TSH: 1.52 u[IU]/mL (ref 0.450–4.500)

## 2016-07-12 LAB — LIPID PANEL
CHOL/HDL RATIO: 2.2 ratio (ref 0.0–4.4)
CHOLESTEROL TOTAL: 215 mg/dL — AB (ref 100–199)
HDL: 97 mg/dL (ref >39)
LDL CALC: 107 mg/dL — AB (ref 0–99)
TRIGLYCERIDES: 54 mg/dL (ref 0–149)
VLDL CHOLESTEROL CAL: 11 mg/dL (ref 5–40)

## 2016-07-12 LAB — SEDIMENTATION RATE: SED RATE: 7 mm/h (ref 0–40)

## 2016-07-12 NOTE — Telephone Encounter (Signed)
-----   Message from Jerrol Banana., MD sent at 07/12/2016 10:28 AM EDT ----- Labs all normal.

## 2016-07-12 NOTE — Telephone Encounter (Signed)
Unable to contact patient due to VM not set up. Will try again later.

## 2016-07-12 NOTE — Telephone Encounter (Signed)
Advised patient of results.  

## 2016-07-15 ENCOUNTER — Telehealth: Payer: Self-pay

## 2016-07-15 NOTE — Telephone Encounter (Signed)
Pt advised. Emily Drozdowski, CMA  

## 2016-07-15 NOTE — Telephone Encounter (Signed)
-----   Message from Jerrol Banana., MD sent at 07/12/2016  9:11 PM EDT ----- CXR OK

## 2016-07-15 NOTE — Telephone Encounter (Signed)
-----   Message from Jerrol Banana., MD sent at 07/12/2016  9:11 PM EDT ----- Arthritic changes

## 2016-07-15 NOTE — Telephone Encounter (Signed)
Left message to call back  

## 2016-07-30 ENCOUNTER — Encounter: Payer: Self-pay | Admitting: Family Medicine

## 2016-07-30 ENCOUNTER — Ambulatory Visit (INDEPENDENT_AMBULATORY_CARE_PROVIDER_SITE_OTHER): Payer: Medicare Other | Admitting: Family Medicine

## 2016-07-30 VITALS — BP 98/58 | HR 72 | Temp 97.6°F | Resp 16 | Wt 118.0 lb

## 2016-07-30 DIAGNOSIS — G8929 Other chronic pain: Secondary | ICD-10-CM

## 2016-07-30 DIAGNOSIS — K219 Gastro-esophageal reflux disease without esophagitis: Secondary | ICD-10-CM | POA: Diagnosis not present

## 2016-07-30 DIAGNOSIS — M5442 Lumbago with sciatica, left side: Secondary | ICD-10-CM | POA: Diagnosis not present

## 2016-07-30 DIAGNOSIS — Z23 Encounter for immunization: Secondary | ICD-10-CM

## 2016-07-30 DIAGNOSIS — R5383 Other fatigue: Secondary | ICD-10-CM | POA: Diagnosis not present

## 2016-07-30 MED ORDER — RANITIDINE HCL 150 MG PO CAPS: 150.0000 mg | ORAL_CAPSULE | Freq: Two times a day (BID) | ORAL | 3 refills | Status: DC

## 2016-07-30 MED ORDER — OMEPRAZOLE 20 MG PO CPDR: 20.0000 mg | DELAYED_RELEASE_CAPSULE | Freq: Every day | ORAL | 0 refills | Status: DC

## 2016-07-30 NOTE — Progress Notes (Signed)
Subjective:  HPI Pt is here for a follow up on fatigue and GERD. She was having chest pain and fatigue. CXR, EKG and labs were ordered and all were normal. She was started on Omeprazole and reports that she is feeling much better and the chest pain has gotten much better.   Today pt has a a aching pain in her left leg that starts in her hip. She reports that if she moves around and readjust her self it gets better. She had a back injury years ago and said she may have problems with later in life. Pt unsure if this is related to the injury but wanted to mention it.   Prior to Admission medications   Medication Sig Start Date End Date Taking? Authorizing Provider  cholecalciferol (VITAMIN D) 1000 UNITS tablet Take 1,000 Units by mouth daily.    Historical Provider, MD  estrogen, conjugated,-medroxyprogesterone (PREMPRO) 0.3-1.5 MG tablet Take 1 tablet by mouth daily. 05/10/16   Rubie Maid, MD  ibuprofen (ADVIL,MOTRIN) 800 MG tablet Take 1 tablet (800 mg total) by mouth every 8 (eight) hours as needed. 06/12/16   Dawsyn Ramsaran Maceo Pro., MD  Multiple Vitamin (MULTIVITAMIN) capsule Take 1 capsule by mouth daily.    Historical Provider, MD  omeprazole (PRILOSEC) 20 MG capsule Take 1 capsule (20 mg total) by mouth daily. 07/10/16   Atharv Barriere Maceo Pro., MD  sertraline (ZOLOFT) 50 MG tablet TAKE 1 TABLET DAILY AS DIRECTED 11/24/14   Historical Provider, MD  tazarotene (AVAGE) 0.1 % cream  03/08/16   Historical Provider, MD  traMADol (ULTRAM) 50 MG tablet Take 1 tablet (50 mg total) by mouth 2 (two) times daily as needed. 04/25/16   Tyriq Moragne Maceo Pro., MD  VAYACOG 100-19.5-6.5 MG CAPS TAKE ONE CAPSULE BY MOUTH ONCE DAILY 04/08/16   Jerrol Banana., MD    Patient Active Problem List   Diagnosis Date Noted  . Menopausal syndrome on hormone replacement therapy 04/18/2015  . History of postmenopausal bleeding 04/18/2015    History reviewed. No pertinent past medical history.  Social History    Social History  . Marital status: Married    Spouse name: N/A  . Number of children: N/A  . Years of education: N/A   Occupational History  . Not on file.   Social History Main Topics  . Smoking status: Never Smoker  . Smokeless tobacco: Never Used  . Alcohol use No  . Drug use: No  . Sexual activity: Yes    Birth control/ protection: None, Surgical   Other Topics Concern  . Not on file   Social History Narrative  . No narrative on file    No Known Allergies  Review of Systems  Constitutional: Positive for malaise/fatigue.  HENT: Negative.   Eyes: Negative.   Respiratory: Negative.   Cardiovascular: Positive for chest pain.  Gastrointestinal: Negative.   Genitourinary: Negative.   Musculoskeletal: Negative.        Left leg pain  Skin: Negative.   Neurological: Negative.   Endo/Heme/Allergies: Negative.   Psychiatric/Behavioral: Negative.     Immunization History  Administered Date(s) Administered  . Influenza, High Dose Seasonal PF 08/19/2015   Objective:  BP (!) 98/58 (BP Location: Left Arm, Patient Position: Sitting, Cuff Size: Normal)   Pulse 72   Temp 97.6 F (36.4 C) (Oral)   Resp 16   Wt 118 lb (53.5 kg)   BMI 20.90 kg/m   Physical Exam  Constitutional: She is oriented  to person, place, and time and well-developed, well-nourished, and in no distress.  HENT:  Head: Normocephalic and atraumatic.  Right Ear: External ear normal.  Left Ear: External ear normal.  Nose: Nose normal.  Mouth/Throat: Oropharynx is clear and moist.  Eyes: Conjunctivae and EOM are normal. Pupils are equal, round, and reactive to light.  Neck: Normal range of motion. Neck supple.  Cardiovascular: Normal rate, regular rhythm, normal heart sounds and intact distal pulses.   Pulmonary/Chest: Effort normal and breath sounds normal.  Abdominal: Soft.  Musculoskeletal: Normal range of motion.  Neurological: She is alert and oriented to person, place, and time. She has  normal reflexes. Gait normal. GCS score is 15.  Skin: Skin is warm and dry.  Psychiatric: Mood, memory, affect and judgment normal.    Lab Results  Component Value Date   WBC 5.6 07/11/2016   HCT 37.7 07/11/2016   PLT 232 07/11/2016   GLUCOSE 75 07/11/2016   CHOL 215 (H) 07/11/2016   TRIG 54 07/11/2016   HDL 97 07/11/2016   LDLCALC 107 (H) 07/11/2016   TSH 1.520 07/11/2016    CMP     Component Value Date/Time   NA 136 07/11/2016 1058   K 4.5 07/11/2016 1058   CL 97 07/11/2016 1058   CO2 22 07/11/2016 1058   GLUCOSE 75 07/11/2016 1058   BUN 9 07/11/2016 1058   CREATININE 0.84 07/11/2016 1058   CALCIUM 9.6 07/11/2016 1058   PROT 7.0 07/11/2016 1058   ALBUMIN 4.3 07/11/2016 1058   AST 27 07/11/2016 1058   ALT 15 07/11/2016 1058   ALKPHOS 53 07/11/2016 1058   BILITOT 0.6 07/11/2016 1058   GFRNONAA 71 07/11/2016 1058   GFRAA 81 07/11/2016 1058    Assessment and Plan :  1. Other fatigue Improved.  2. Gastroesophageal reflux disease without esophagitis Chest pain improved with Omeprazole. Due to safety of using PPI, Switch to Zantac once her 90 day rx is complete.  Patient instructed to elevate the head of her bed in addition to the medications we have prescribed today. - omeprazole (PRILOSEC) 20 MG capsule; Take 1 capsule (20 mg total) by mouth daily.  Dispense: 90 capsule; Refill: 0 - ranitidine (ZANTAC) 150 MG capsule; Take 1 capsule (150 mg total) by mouth 2 (two) times daily.  Dispense: 180 capsule; Refill: 3  3. Need for influenza vaccination  - Flu vaccine HIGH DOSE PF  4. Chronic midline low back pain with left-sided sciatica  - Ambulatory referral to Physical Therapy 5. DDD I have done the exam and reviewed the above chart and it is accurate to the best of my knowledge.  Miguel Aschoff MD [facility] 07/30/2016 8:23 AM

## 2016-08-06 ENCOUNTER — Encounter: Payer: Self-pay | Admitting: Physical Therapy

## 2016-08-06 ENCOUNTER — Ambulatory Visit: Payer: Medicare Other | Attending: Family Medicine | Admitting: Physical Therapy

## 2016-08-06 DIAGNOSIS — M5442 Lumbago with sciatica, left side: Secondary | ICD-10-CM | POA: Insufficient documentation

## 2016-08-06 DIAGNOSIS — G8929 Other chronic pain: Secondary | ICD-10-CM | POA: Insufficient documentation

## 2016-08-06 DIAGNOSIS — M5440 Lumbago with sciatica, unspecified side: Secondary | ICD-10-CM

## 2016-08-06 NOTE — Therapy (Signed)
[place] MAIN [facility] SERVICES [address] [place], [place], [address] Phone: [phone]   Fax:  [phone]  Physical Therapy Evaluation  Patient Details  Name: Alexis Medina MRN: [id number] Date of Birth: [birth year] Referring Provider: Jerrol Banana  Encounter Date: 08/06/2016      PT End of Session - 08/06/16 1729    Visit Number 1   Number of Visits 17   Date for PT Re-Evaluation 2016-10-07   Authorization Type g codes   PT Start Time 0530   PT Stop Time 0630   PT Time Calculation (min) 60 min   Activity Tolerance Patient tolerated treatment well;Patient limited by pain      History reviewed. No pertinent past medical history.  Past Surgical History:  Procedure Laterality Date  . TONSILLECTOMY    . TUBAL LIGATION      There were no vitals filed for this visit.       Subjective Assessment - 08/06/16 1727    Subjective Patient has low back pain that is chronic but it is getting worse. She had therapy for her back 4 years ago and now is having increased back pain and left leg numbness.   Pertinent History chronic low back pain   Currently in Pain? No/denies   Multiple Pain Sites No            [facility] PT Assessment - 08/06/16 1741      Assessment   Medical Diagnosis low back pain   Referring Provider Cranford Mon, RICHARD L   Onset Date/Surgical Date 08/01/16   Hand Dominance Right   Next MD Visit 11/05/15   Prior Therapy --  4 years ago     Precautions   Precautions None     Restrictions   Weight Bearing Restrictions No     Balance Screen   Has the patient fallen in the past 6 months No   Has the patient had a decrease in activity level because of a fear of falling?  No   Is the patient reluctant to leave their home because of a fear of falling?  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       PAIN:  POSTURE: WNL  Accessory motions: L1-L5 hypomobile   PROM/AROM: WFL BLE hips Trunk extension is painful   STRENGTH:  Graded on a 0-5 scale Muscle Group Left Right                          Hip Flex 4/5 4/5  Hip Abd 4/5 4/5  Hip Add 4/5 4/5  Hip Ext 4/5 4/5  Hip IR/ER 4/5 4/5  Knee Flex 5/5 5/5  Knee Ext 5/5 5/5  Ankle DF 5/5 5/5  Ankle PF 5/5 5/5   SENSATION: numbness intermittent left  thigh   SPECIAL TESTS: + FABER BLE, + spring test L4, L4   FUNCTIONAL MOBILITY: Independent   BALANCE: WNL   GAIT: WNL   Therapeutic exercise: Instructed in standing trunk repeated extension x 10 for HEP Long axis distraction BLE x 8 minutes.  Patient has relief of BLE hip pain with manual therapy                      PT Education - 08/06/16 1727    Education provided Yes   Education Details plan of care   Person(s) Educated Patient   Methods Explanation   Comprehension Verbalized understanding             PT Long Term Goals - 08/06/16 1736      PT LONG TERM GOAL #1   Title Patient will be independent in home exercise program to improve strength/mobility for better functional independence with ADLs.   Time 12   Period Weeks   Status New     PT LONG TERM GOAL #2   Title  Patient will be able to perform household work/ chores without increase in symptoms.   Time 12   Period Weeks   Status New     PT LONG TERM GOAL #3   Title Patient will report a worst pain of 3/10  on VAS in low back to improve tolerance with ADLs and reduced symptoms with activities.    Time 12   Period Weeks   Status New     PT LONG TERM GOAL #4   Title Patient will increase lumbar extension strength to at least 4/5 as to improve gross strength for sitting/standing tolerance with better erect posture for increased tolerance with ADLs   Time 12   Period Weeks   Status New               Plan - 08/06/16 1730    Clinical Impression Statement Patient has low back pain and left thigh numbness that gets worse with sitting. She has pain that is intermittent from 0/10 to 10/10. She has BLE + FABER and + spring test L3, L4. She is limited with standing,. She will benefit from skilled PT to improve pain and positioning.    Rehab Potential Good   PT Frequency 2x / week   PT Duration 12 weeks   PT Treatment/Interventions Cryotherapy;Aquatic Therapy;Electrical Stimulation;Moist Heat;Traction;Ultrasound;Therapeutic activities;Gait training;Therapeutic exercise;Balance training;Manual techniques;Passive range of motion;Dry needling   PT Next Visit Plan manual therapy, core strengthening , body mechanics   PT Home Exercise Plan prone extenison   Consulted and Agree with Plan of Care Patient      Patient will benefit from skilled therapeutic intervention in order to improve the following deficits and impairments:  Difficulty walking, Decreased mobility, Impaired sensation, Decreased range of motion, Pain, Impaired flexibility, Decreased strength, Decreased activity tolerance, Abnormal gait  Visit Diagnosis: Low back pain with sciatica, sciatica laterality unspecified, unspecified back pain laterality, unspecified chronicity      G-Codes - 08/12/2016 0910    Functional Assessment Tool Used clinical judgement   Functional Limitation Mobility: Walking and moving around   Mobility: Walking and Moving Around Current Status [phone]) At least 20 percent but less than 40 percent impaired,  limited or restricted   Mobility: Walking and Moving Around Goal Status [phone]) At least 1 percent but less than 20 percent impaired, limited or restricted       Problem List Patient Active Problem List   Diagnosis  Date Noted  . Menopausal syndrome on hormone replacement therapy 04/18/2015  . History of postmenopausal bleeding 04/18/2015    Alanson Puls 08/07/2016, 9:23 AM  [place] [facility]IN [facility] SERVICES [address] [place], [place], [address] Phone: [phone]   Fax:  [phone]  Name: Alexis Medina MRN: [id number] Date of Birth: December 24, 1945

## 2016-08-13 ENCOUNTER — Encounter: Payer: Self-pay | Admitting: Physical Therapy

## 2016-08-13 ENCOUNTER — Ambulatory Visit: Payer: Medicare Other | Admitting: Physical Therapy

## 2016-08-13 DIAGNOSIS — M5440 Lumbago with sciatica, unspecified side: Secondary | ICD-10-CM

## 2016-08-13 DIAGNOSIS — G8929 Other chronic pain: Secondary | ICD-10-CM | POA: Diagnosis not present

## 2016-08-13 DIAGNOSIS — M5442 Lumbago with sciatica, left side: Secondary | ICD-10-CM | POA: Diagnosis not present

## 2016-08-14 NOTE — Therapy (Signed)
[place] MAIN [facility] SERVICES [address] [place], [place], [address] Phone: [phone]   Fax:  [phone]  Physical Therapy Treatment  Patient Details  Name: Alexis Medina MRN: [id number] Date of Birth: [birth year] Referring Provider: Jerrol Banana  Encounter Date: 08/13/2016      PT End of Session - 08/14/16 1047    Visit Number 2   Number of Visits 17   Date for PT Re-Evaluation 10/01/16   Authorization Type 2 g codes   PT Start Time 0400   PT Stop Time [id number]   PT Time Calculation (min) 45 min   Activity Tolerance Patient tolerated treatment well;Patient limited by pain      History reviewed. No pertinent past medical history.  Past Surgical History:  Procedure Laterality Date  . TONSILLECTOMY    . TUBAL LIGATION      There were no vitals filed for this visit.      Subjective Assessment - 08/13/16 1618    Subjective (P)  Patient has low back pain that is chronic but it is getting worse. She had therapy for her back 4 years ago and now is having increased back pain and left leg numbness.   Pertinent History (P)  chronic low back pain     Therapeutic exercise; Prone repeated extension x 10 x 3 sets Instructed in body mechanics for sink activities, posture in car and sitting position, and seated activities  Manual therapy: PA glides grade 3 and 4 to T 8- L5 and S1 with tenderness and overpressure with repeated prone extension exercises. Patient has no pain after treatment.                             PT Education - 08/14/16 1046    Education provided Yes   Education Details plan of care   Person(s) Educated Patient   Methods Explanation   Comprehension Verbalized understanding             PT Long Term Goals - 08/06/16 1736      PT LONG TERM GOAL #1   Title Patient will be independent in home exercise program to improve strength/mobility for better functional independence with  ADLs.   Time 12   Period Weeks   Status New     PT LONG TERM GOAL #2   Title  Patient will be able to perform household work/ chores without increase in symptoms.   Time 12   Period Weeks   Status New     PT LONG TERM GOAL #3   Title Patient will report a worst pain of 3/10 on VAS in low back to improve tolerance with ADLs and reduced symptoms with activities.    Time 12   Period Weeks   Status New     PT LONG TERM GOAL #4   Title Patient will increase lumbar extension strength to at least 4/5 as to improve gross strength for sitting/standing tolerance with better erect posture for increased tolerance with ADLs   Time 12   Period Weeks   Status New               Plan - 08/14/16 1048    Clinical Impression Statement Patient continues to have intermittent pain in low back and needs instruction with body mechanics for doing table top activities and dishes in the sink. She repsonds to prone repeated extension exercises with overpressure and repoonds  to manual therapy.    Rehab Potential Good   PT Frequency 2x / week   PT Duration 12 weeks   PT Treatment/Interventions Cryotherapy;Aquatic Therapy;Electrical Stimulation;Moist Heat;Traction;Ultrasound;Therapeutic activities;Gait training;Therapeutic exercise;Balance training;Manual techniques;Passive range of motion;Dry needling   PT Next Visit Plan manual therapy, core strengthening , body mechanics   PT Home Exercise Plan prone extenison   Consulted and Agree with Plan of Care Patient      Patient will benefit from skilled therapeutic intervention in order to improve the following deficits and impairments:  Difficulty walking, Decreased mobility, Impaired sensation, Decreased range of motion, Pain, Impaired flexibility, Decreased strength, Decreased activity tolerance, Abnormal gait  Visit Diagnosis: Low back pain with sciatica, sciatica laterality unspecified, unspecified back pain laterality, unspecified  chronicity     Problem List Patient Active Problem List   Diagnosis Date Noted  . Menopausal syndrome on hormone replacement therapy 04/18/2015  . History of postmenopausal bleeding 04/18/2015   Alanson Puls, PT, DPT [place] 08/14/2016, 11:11 AM  [place] MAIN [facility] SERVICES [address] [place], [place], [address] Phone: [phone]   Fax:  [phone]  Name: Alexis Medina MRN: [id number] Date of Birth: [birth year]

## 2016-08-19 ENCOUNTER — Ambulatory Visit: Payer: Medicare Other | Admitting: Physical Therapy

## 2016-08-19 ENCOUNTER — Encounter: Payer: Self-pay | Admitting: Physical Therapy

## 2016-08-19 DIAGNOSIS — M5440 Lumbago with sciatica, unspecified side: Secondary | ICD-10-CM

## 2016-08-19 DIAGNOSIS — G8929 Other chronic pain: Secondary | ICD-10-CM | POA: Diagnosis not present

## 2016-08-19 DIAGNOSIS — M5442 Lumbago with sciatica, left side: Secondary | ICD-10-CM | POA: Diagnosis not present

## 2016-08-19 NOTE — Therapy (Signed)
[place] MAIN [facility] SERVICES [address] [place], [place], [address] Phone: [phone]   Fax:  [phone]  Physical Therapy Treatment  Patient Details  Name: Alexis Medina MRN: [id number] Date of Birth: 10-02-1946 Referring Provider: Jerrol Banana  Encounter Date: 08/19/2016    History reviewed. No pertinent past medical history.  Past Surgical History:  Procedure Laterality Date  . TONSILLECTOMY    . TUBAL LIGATION      There were no vitals filed for this visit.      Subjective Assessment - 08/19/16 1714    Subjective Patient has no pain today for the first time.    Pertinent History chronic low back pain   Currently in Pain? No/denies   Pain Score 0-No pain   Pain Onset More than a month ago       Therapeutic exercise: Supine hooklying with TA , GTB hip abd/ER x 20 Supine  Heel slides with TA x 20 Supine marching with TA x 20 Standing with TA and shoulder flex x 20 Standing with TA and trunk rotation x 20   Supine knee to chest stretch x 30 sec x 3 No reports of pain during or following treatment. Patient needs cues for posture and TA contraction.                             PT Education - 08/19/16 1715    Education provided Yes   Education Details HEP   Person(s) Educated Patient   Methods Explanation   Comprehension Verbalized understanding             PT Long Term Goals - 08/06/16 1736      PT LONG TERM GOAL #1   Title Patient will be independent in home exercise program to improve strength/mobility for better functional independence with ADLs.   Time 12   Period Weeks   Status New     PT LONG TERM GOAL #2   Title  Patient will be able to perform household work/ chores without increase in symptoms.   Time 12   Period Weeks   Status New     PT LONG TERM GOAL #3   Title Patient will report a worst pain of 3/10 on VAS in low back to improve tolerance with ADLs and  reduced symptoms with activities.    Time 12   Period Weeks   Status New     PT LONG TERM GOAL #4   Title Patient will increase lumbar extension strength to at least 4/5 as to improve gross strength for sitting/standing tolerance with better erect posture for increased tolerance with ADLs   Time 12   Period Weeks   Status New               Plan - 08/19/16 1718    Clinical Impression Statement Patient is getting better without any pain symptoms today. she is able to perform core strengthening and stretching today without any pain in low back.    Rehab Potential Good   PT Frequency 2x / week   PT Duration 12 weeks   PT Treatment/Interventions Cryotherapy;Aquatic Therapy;Electrical Stimulation;Moist Heat;Traction;Ultrasound;Therapeutic activities;Gait training;Therapeutic exercise;Balance training;Manual techniques;Passive range of motion;Dry needling   PT Next Visit Plan manual therapy, core strengthening , body mechanics   PT Home Exercise Plan prone extenison   Consulted and Agree with Plan of Care Patient      Patient will  benefit from skilled therapeutic intervention in order to improve the following deficits and impairments:  Difficulty walking, Decreased mobility, Impaired sensation, Decreased range of motion, Pain, Impaired flexibility, Decreased strength, Decreased activity tolerance, Abnormal gait  Visit Diagnosis: Low back pain with sciatica, sciatica laterality unspecified, unspecified back pain laterality, unspecified chronicity     Problem List Patient Active Problem List   Diagnosis Date Noted  . Menopausal syndrome on hormone replacement therapy 04/18/2015  . History of postmenopausal bleeding 04/18/2015    Alanson Puls 08/19/2016, 5:23 PM  [place] MAIN [facility] SERVICES [address] [place], [place], [address] Phone: [phone]   Fax:  [phone]  Name: Alexis Medina MRN: [id number] Date of  Birth: [birth year]

## 2016-08-21 ENCOUNTER — Ambulatory Visit: Payer: Medicare Other | Attending: Family Medicine | Admitting: Physical Therapy

## 2016-08-21 ENCOUNTER — Encounter: Payer: Self-pay | Admitting: Physical Therapy

## 2016-08-21 DIAGNOSIS — G8929 Other chronic pain: Secondary | ICD-10-CM | POA: Insufficient documentation

## 2016-08-21 DIAGNOSIS — M5442 Lumbago with sciatica, left side: Secondary | ICD-10-CM | POA: Insufficient documentation

## 2016-08-21 DIAGNOSIS — M5440 Lumbago with sciatica, unspecified side: Secondary | ICD-10-CM

## 2016-08-21 NOTE — Therapy (Signed)
[place] MAIN [facility] SERVICES [address] [place], [place], [address] Phone: [phone]   Fax:  [phone]  Physical Therapy Treatment  Patient Details  Name: Alexis Medina MRN: [id number] Date of Birth: [birth year] Referring Provider: Jerrol Banana  Encounter Date: 08/21/2016      PT End of Session - 08/21/16 1743    Visit Number 4   Number of Visits 17   Date for PT Re-Evaluation 10/01/16   Authorization Type 4 g codes   PT Start Time 0430   PT Stop Time 0515   PT Time Calculation (min) 45 min   Activity Tolerance Patient tolerated treatment well;Patient limited by pain      History reviewed. No pertinent past medical history.  Past Surgical History:  Procedure Laterality Date  . TONSILLECTOMY    . TUBAL LIGATION      There were no vitals filed for this visit.      Subjective Assessment - 08/21/16 1639    Subjective (P)  Patient has no pain today for the first time.    Pertinent History (P)  chronic low back pain   Pain Onset (P)  More than a month ago        Therapeutic exercise: Supine hooklying with TA , GTB hip abd/ER x 20 Supine  Heel slides with TA x 20 Supine marching with TA x 20 Standing with TA and shoulder flex x 20 Standing with TA and trunk rotation x 20        Supine knee to chest stretch x 30 sec x 3 No reports of pain during or following treatment. Patient needs cues for posture and TA contraction. Manual therapy: PA glides L1- L5 grade 3 and 4 x 30 bouts and over pressure to L4-5 followed by prone extension x 10 x 2 sets Patient has no pain after therapy.                             PT Long Term Goals - 08/06/16 1736      PT LONG TERM GOAL #1   Title Patient will be independent in home exercise program to improve strength/mobility for better functional independence with ADLs.   Time 12   Period Weeks   Status New     PT LONG TERM GOAL #2   Title  Patient  will be able to perform household work/ chores without increase in symptoms.   Time 12   Period Weeks   Status New     PT LONG TERM GOAL #3   Title Patient will report a worst pain of 3/10 on VAS in low back to improve tolerance with ADLs and reduced symptoms with activities.    Time 12   Period Weeks   Status New     PT LONG TERM GOAL #4   Title Patient will increase lumbar extension strength to at least 4/5 as to improve gross strength for sitting/standing tolerance with better erect posture for increased tolerance with ADLs   Time 12   Period Weeks   Status New               Plan - 08/21/16 1743    Clinical Impression Statement Patient has 2/10 pain prior to manual therapy and 0/10 pain following manual therapy. she is able to perform all core strengtening without bringing back her pain symptoms.    Rehab Potential Good   PT  Frequency 2x / week   PT Duration 12 weeks   PT Treatment/Interventions Cryotherapy;Aquatic Therapy;Electrical Stimulation;Moist Heat;Traction;Ultrasound;Therapeutic activities;Gait training;Therapeutic exercise;Balance training;Manual techniques;Passive range of motion;Dry needling   PT Next Visit Plan manual therapy, core strengthening , body mechanics   PT Home Exercise Plan prone extenison   Consulted and Agree with Plan of Care Patient      Patient will benefit from skilled therapeutic intervention in order to improve the following deficits and impairments:  Difficulty walking, Decreased mobility, Impaired sensation, Decreased range of motion, Pain, Impaired flexibility, Decreased strength, Decreased activity tolerance, Abnormal gait  Visit Diagnosis: Low back pain with sciatica, sciatica laterality unspecified, unspecified back pain laterality, unspecified chronicity     Problem List Patient Active Problem List   Diagnosis Date Noted  . Menopausal syndrome on hormone replacement therapy 04/18/2015  . History of postmenopausal bleeding  04/18/2015    Alanson Puls 08/21/2016, 5:45 PM  [place] MAIN [facility] SERVICES [address] [place], [place], [address] Phone: [phone]   Fax:  [phone]  Name: Alexis Medina MRN: [id number] Date of Birth: [birth year]

## 2016-08-26 ENCOUNTER — Ambulatory Visit: Payer: Medicare Other | Admitting: Physical Therapy

## 2016-08-26 ENCOUNTER — Encounter: Payer: Self-pay | Admitting: Physical Therapy

## 2016-08-26 DIAGNOSIS — G8929 Other chronic pain: Secondary | ICD-10-CM | POA: Diagnosis not present

## 2016-08-26 DIAGNOSIS — M5440 Lumbago with sciatica, unspecified side: Secondary | ICD-10-CM

## 2016-08-26 DIAGNOSIS — M5442 Lumbago with sciatica, left side: Secondary | ICD-10-CM | POA: Diagnosis not present

## 2016-08-26 NOTE — Therapy (Signed)
[place] MAIN [facility] SERVICES [address] [place], [place], [address] Phone: [phone]   Fax:  [phone]  Physical Therapy Treatment  Patient Details  Name: Alexis Medina MRN: [id number] Date of Birth: July 11, 1946 Referring Provider: Jerrol Banana  Encounter Date: 08/26/2016      PT End of Session - 08/26/16 1412    Visit Number 5   Number of Visits 17   Date for PT Re-Evaluation 10/01/16   Authorization Type 5 g codes   PT Start Time 0145   PT Stop Time 0230   PT Time Calculation (min) 45 min   Activity Tolerance Patient tolerated treatment well;Patient limited by pain      History reviewed. No pertinent past medical history.  Past Surgical History:  Procedure Laterality Date  . TONSILLECTOMY    . TUBAL LIGATION      There were no vitals filed for this visit.      Subjective Assessment - 08/26/16 1408    Subjective Patient has 4/10 pain in back today from making dinner standing.    Pertinent History chronic low back pain   Pain Onset More than a month ago      Therapeutic exercise: Supine hooklying with TA , GTB hip abd/ER x 20 Supine Heel slides with TA x 20 Supine marching with TA x 20 Standing with TA and shoulder flex x 20 Standing with TA and trunk rotation x 20  Supine knee to chest stretch x 30 sec x 3 Seated on theraball with diagonal theraband left and right x 20, LE leg extension No reports of pain during or following treatment. Patient needs cues for posture and TA contraction. Manual therapy: PA glides L1- L5 grade 3 and 4 x 30 bouts and over pressure to L4-5 followed by prone extension x 10 x 2 sets Patient has no pain after therapy.                           PT Education - 08/26/16 1411    Education provided Yes   Education Details HEP   Person(s) Educated Patient   Methods Explanation   Comprehension Verbalized understanding             PT Long  Term Goals - 08/06/16 1736      PT LONG TERM GOAL #1   Title Patient will be independent in home exercise program to improve strength/mobility for better functional independence with ADLs.   Time 12   Period Weeks   Status New     PT LONG TERM GOAL #2   Title  Patient will be able to perform household work/ chores without increase in symptoms.   Time 12   Period Weeks   Status New     PT LONG TERM GOAL #3   Title Patient will report a worst pain of 3/10 on VAS in low back to improve tolerance with ADLs and reduced symptoms with activities.    Time 12   Period Weeks   Status New     PT LONG TERM GOAL #4   Title Patient will increase lumbar extension strength to at least 4/5 as to improve gross strength for sitting/standing tolerance with better erect posture for increased tolerance with ADLs   Time 12   Period Weeks   Status New               Plan - 08/26/16 1412  Clinical Impression Statement Patient is able to perform all core exercises without increasing her pain. She is able to decrease her pain after manual therapy and begin progressed core exercises.    Rehab Potential Good   PT Frequency 2x / week   PT Duration 12 weeks   PT Treatment/Interventions Cryotherapy;Aquatic Therapy;Electrical Stimulation;Moist Heat;Traction;Ultrasound;Therapeutic activities;Gait training;Therapeutic exercise;Balance training;Manual techniques;Passive range of motion;Dry needling   PT Next Visit Plan manual therapy, core strengthening , body mechanics   PT Home Exercise Plan prone extenison   Consulted and Agree with Plan of Care Patient      Patient will benefit from skilled therapeutic intervention in order to improve the following deficits and impairments:  Difficulty walking, Decreased mobility, Impaired sensation, Decreased range of motion, Pain, Impaired flexibility, Decreased strength, Decreased activity tolerance, Abnormal gait  Visit Diagnosis: Low back pain with sciatica,  sciatica laterality unspecified, unspecified back pain laterality, unspecified chronicity     Problem List Patient Active Problem List   Diagnosis Date Noted  . Menopausal syndrome on hormone replacement therapy 04/18/2015  . History of postmenopausal bleeding 04/18/2015    Alanson Puls 08/26/2016, 2:35 PM  [place] MAIN [facility] SERVICES [address] [place], [place], [address] Phone: [phone]   Fax:  [phone]  Name: DARYAH KLAMM MRN: [id number] Date of Birth: [birth year]

## 2016-08-29 ENCOUNTER — Ambulatory Visit: Payer: Medicare Other | Admitting: Physical Therapy

## 2016-08-29 DIAGNOSIS — M5442 Lumbago with sciatica, left side: Secondary | ICD-10-CM | POA: Diagnosis not present

## 2016-08-29 DIAGNOSIS — G8929 Other chronic pain: Secondary | ICD-10-CM | POA: Diagnosis not present

## 2016-08-29 DIAGNOSIS — M5440 Lumbago with sciatica, unspecified side: Secondary | ICD-10-CM

## 2016-08-29 NOTE — Therapy (Signed)
[place] MAIN [facility] SERVICES [address] [place], [place], [address] Phone: [phone]   Fax:  [phone]  Physical Therapy Treatment  Patient Details  Name: Alexis Medina MRN: [id number] Date of Birth: 03/19/1946 Referring Provider: Jerrol Banana  Encounter Date: 08/29/2016      PT End of Session - 08/29/16 1752    Visit Number 6   Number of Visits 17   Date for PT Re-Evaluation 10/01/16   Authorization Type 6 g codes   Activity Tolerance Patient tolerated treatment well;Patient limited by pain      No past medical history on file.  Past Surgical History:  Procedure Laterality Date  . TONSILLECTOMY    . TUBAL LIGATION      There were no vitals filed for this visit.      Subjective Assessment - 08/29/16 1752    Subjective Patient is doing well today and has no pain.   Currently in Pain? No/denies   Pain Score 0-No pain      Therapeutic exercise: Supine hooklying with TA , GTB hip abd/ER x 20 Supine Heel slides with TA x 20 Supine marching with TA x 20 Standing with TA and shoulder flex x 20 Standing with TA and trunk rotation x 20  Supine knee to chest stretch x 30 sec x 3 Seated on theraball with diagonal theraband left and right x 20, LE leg extension No reports of pain during or following treatment. Patient needs cues for posture and TA contraction. Manual therapy: PA glides L1- L5 grade 3 and 4 x 30 bouts and over pressure to L4-5 followed by prone extension x 10 x 2 sets Patient has no pain after therapy                           PT Education - 08/29/16 1752    Education provided Yes   Education Details HEP   Person(s) Educated Patient   Methods Explanation   Comprehension Verbalized understanding             PT Long Term Goals - 08/06/16 1736      PT LONG TERM GOAL #1   Title Patient will be independent in home exercise program to improve strength/mobility  for better functional independence with ADLs.   Time 12   Period Weeks   Status New     PT LONG TERM GOAL #2   Title  Patient will be able to perform household work/ chores without increase in symptoms.   Time 12   Period Weeks   Status New     PT LONG TERM GOAL #3   Title Patient will report a worst pain of 3/10 on VAS in low back to improve tolerance with ADLs and reduced symptoms with activities.    Time 12   Period Weeks   Status New     PT LONG TERM GOAL #4   Title Patient will increase lumbar extension strength to at least 4/5 as to improve gross strength for sitting/standing tolerance with better erect posture for increased tolerance with ADLs   Time 12   Period Weeks   Status New               Plan - 08/29/16 1752    Clinical Impression Statement Patient is able to perform advanced core strengthening exercises on thera ball.    Rehab Potential Good   PT Frequency 2x / week  PT Duration 12 weeks   PT Treatment/Interventions Cryotherapy;Aquatic Therapy;Electrical Stimulation;Moist Heat;Traction;Ultrasound;Therapeutic activities;Gait training;Therapeutic exercise;Balance training;Manual techniques;Passive range of motion;Dry needling   PT Next Visit Plan manual therapy, core strengthening , body mechanics   PT Home Exercise Plan prone extenison   Consulted and Agree with Plan of Care Patient      Patient will benefit from skilled therapeutic intervention in order to improve the following deficits and impairments:  Difficulty walking, Decreased mobility, Impaired sensation, Decreased range of motion, Pain, Impaired flexibility, Decreased strength, Decreased activity tolerance, Abnormal gait  Visit Diagnosis: Low back pain with sciatica, sciatica laterality unspecified, unspecified back pain laterality, unspecified chronicity     Problem List Patient Active Problem List   Diagnosis Date Noted  . Menopausal syndrome on hormone replacement therapy 04/18/2015   . History of postmenopausal bleeding 04/18/2015   Alanson Puls, PT, DPT [place] S 08/29/2016, 5:54 PM  [place] MAIN [facility] SERVICES [address] [place], [place], [address] Phone: [phone]   Fax:  [phone]  Name: Alexis Medina MRN: [id number] Date of Birth: January 31, 1946

## 2016-09-02 ENCOUNTER — Encounter: Payer: Self-pay | Admitting: Physical Therapy

## 2016-09-02 ENCOUNTER — Ambulatory Visit: Payer: Medicare Other | Admitting: Physical Therapy

## 2016-09-02 DIAGNOSIS — M5442 Lumbago with sciatica, left side: Secondary | ICD-10-CM | POA: Diagnosis not present

## 2016-09-02 DIAGNOSIS — M5440 Lumbago with sciatica, unspecified side: Secondary | ICD-10-CM

## 2016-09-02 DIAGNOSIS — G8929 Other chronic pain: Secondary | ICD-10-CM | POA: Diagnosis not present

## 2016-09-02 NOTE — Therapy (Signed)
[place] MAIN [facility] SERVICES [address] [place], [place], [address] Phone: [phone]   Fax:  [phone]  Physical Therapy Treatment  Patient Details  Name: Alexis Medina MRN: [id number] Date of Birth: 02-Feb-1946 Referring Provider: Jerrol Banana  Encounter Date: 09/02/2016      PT End of Session - 09/02/16 1725    Visit Number 7   Number of Visits 17   Date for PT Re-Evaluation 10/01/16   Authorization Type  7 g codes   PT Start Time 0430   PT Stop Time 0515   PT Time Calculation (min) 45 min   Activity Tolerance Patient tolerated treatment well;Patient limited by pain      History reviewed. No pertinent past medical history.  Past Surgical History:  Procedure Laterality Date  . TONSILLECTOMY    . TUBAL LIGATION      There were no vitals filed for this visit.      Subjective Assessment - 09/02/16 1724    Subjective Patient is doing well today and has 2/10 pain.   Pertinent History chronic low back pain   Pain Onset More than a month ago        Manual therapy: PA glides , grade 3 and 4 L1- L5 and STM to and lumbar paraspinal muslces and extensors to decrease pain and spasms with patient positioned in prone. Therapeutic exercise: Supine hooklying with TA , GTB hip abd/ER x 20 Supine Heel slides with TA x 20 Supine marching with TA x 20 Standing with TA and shoulder flex x 20 Standing with TA and trunk rotation x 20  Supine knee to chest stretch x 30 sec x 3 Seated on theraball with diagonal theraband left and right x 20, LE leg extension No reports of pain during or following treatment. Patient needs cues for posture and TA contraction.                          PT Education - 09/02/16 1725    Education provided Yes   Education Details HEP progression   Person(s) Educated Patient   Methods Explanation   Comprehension Verbalized understanding             PT Long  Term Goals - 08/06/16 1736      PT LONG TERM GOAL #1   Title Patient will be independent in home exercise program to improve strength/mobility for better functional independence with ADLs.   Time 12   Period Weeks   Status New     PT LONG TERM GOAL #2   Title  Patient will be able to perform household work/ chores without increase in symptoms.   Time 12   Period Weeks   Status New     PT LONG TERM GOAL #3   Title Patient will report a worst pain of 3/10 on VAS in low back to improve tolerance with ADLs and reduced symptoms with activities.    Time 12   Period Weeks   Status New     PT LONG TERM GOAL #4   Title Patient will increase lumbar extension strength to at least 4/5 as to improve gross strength for sitting/standing tolerance with better erect posture for increased tolerance with ADLs   Time 12   Period Weeks   Status New               Plan - 09/02/16 1733    Clinical Impression  Statement Patient is able to progress core strengthening after manual therapy to reduce her back pain.    Rehab Potential Good   PT Frequency 2x / week   PT Duration 12 weeks   PT Treatment/Interventions Cryotherapy;Aquatic Therapy;Electrical Stimulation;Moist Heat;Traction;Ultrasound;Therapeutic activities;Gait training;Therapeutic exercise;Balance training;Manual techniques;Passive range of motion;Dry needling   PT Next Visit Plan manual therapy, core strengthening , body mechanics   PT Home Exercise Plan prone extenison   Consulted and Agree with Plan of Care Patient      Patient will benefit from skilled therapeutic intervention in order to improve the following deficits and impairments:  Difficulty walking, Decreased mobility, Impaired sensation, Decreased range of motion, Pain, Impaired flexibility, Decreased strength, Decreased activity tolerance, Abnormal gait  Visit Diagnosis: Low back pain with sciatica, sciatica laterality unspecified, unspecified back pain laterality,  unspecified chronicity     Problem List Patient Active Problem List   Diagnosis Date Noted  . Menopausal syndrome on hormone replacement therapy 04/18/2015  . History of postmenopausal bleeding 04/18/2015  Alexis Medina, PT, DPT  [place], [place] S 09/02/2016, 5:35 PM  [place] MAIN [facility] SERVICES [address] [place], [place], [address] Phone: [phone]   Fax:  [phone]  Name: Alexis Medina MRN: [id number] Date of Birth: 1946/03/16

## 2016-09-03 ENCOUNTER — Encounter: Payer: Medicare Other | Admitting: Physical Therapy

## 2016-09-04 ENCOUNTER — Ambulatory Visit: Payer: Medicare Other | Admitting: Physical Therapy

## 2016-09-05 ENCOUNTER — Ambulatory Visit: Payer: Medicare Other | Admitting: Physical Therapy

## 2016-09-05 ENCOUNTER — Encounter: Payer: Medicare Other | Admitting: Physical Therapy

## 2016-09-05 ENCOUNTER — Encounter: Payer: Self-pay | Admitting: Physical Therapy

## 2016-09-05 DIAGNOSIS — M5442 Lumbago with sciatica, left side: Secondary | ICD-10-CM | POA: Diagnosis not present

## 2016-09-05 DIAGNOSIS — M5440 Lumbago with sciatica, unspecified side: Secondary | ICD-10-CM

## 2016-09-05 DIAGNOSIS — G8929 Other chronic pain: Secondary | ICD-10-CM | POA: Diagnosis not present

## 2016-09-05 NOTE — Therapy (Signed)
[place] MAIN [facility] SERVICES [address] [place], [place], [address] Phone: [phone]   Fax:  [phone]  Physical Therapy TreatmentDC summary  Patient Details  Name: Alexis Medina MRN: [id number] Date of Birth: 1945-12-16 Referring Provider: Jerrol Banana  Encounter Date: 09/05/2016      PT End of Session - 09/05/16 1526    Visit Number 8   Number of Visits 17   Date for PT Re-Evaluation 10/01/16   Authorization Type  8 g codes   PT Start Time 0230   PT Stop Time 0300   PT Time Calculation (min) 30 min   Activity Tolerance Patient tolerated treatment well;Patient limited by pain      History reviewed. No pertinent past medical history.  Past Surgical History:  Procedure Laterality Date  . TONSILLECTOMY    . TUBAL LIGATION      There were no vitals filed for this visit.      Subjective Assessment - 09/05/16 1523    Subjective Patient is doing well today and has 0/10 pain.   Pertinent History chronic low back pain   Currently in Pain? No/denies   Pain Score 0-No pain   Pain Onset More than a month ago        Fitness machines in gym review for back safety and settings Core strengthening instruction with dynamic 1/2 foam and bosu ball Reviewed low back stretch and body mechanics in reaching and bending. Patient has no pain today and has minimal pain throughout the week that gets better as the day goes by.                          PT Education - 09/05/16 1526    Education provided Yes   Education Details core strengthening progression   Person(s) Educated Patient   Methods Explanation   Comprehension Verbalized understanding             PT Long Term Goals - 08/06/16 1736      PT LONG TERM GOAL #1   Title Patient will be independent in home exercise program to improve strength/mobility for better functional independence with ADLs.   Time 12   Period Weeks   Status New     PT LONG TERM GOAL #2   Title  Patient will be able to perform household work/ chores without increase in symptoms.   Time 12   Period Weeks   Status New     PT LONG TERM GOAL #3   Title Patient will report a worst pain of 3/10 on VAS in low back to improve tolerance with ADLs and reduced symptoms with activities.    Time 12   Period Weeks   Status New     PT LONG TERM GOAL #4   Title Patient will increase lumbar extension strength to at least 4/5 as to improve gross strength for sitting/standing tolerance with better erect posture for increased tolerance with ADLs   Time 12   Period Weeks   Status New               Plan - 09/05/16 1527    Clinical Impression Statement Patient is able to progress core strengthening and HEP was reviewed . Fittness machines were reviewed for back safety and HEP.    Rehab Potential Good   PT Frequency 2x / week   PT Duration 12 weeks   PT Treatment/Interventions Cryotherapy;Aquatic Therapy;Electrical Stimulation;Moist  Heat;Traction;Ultrasound;Therapeutic activities;Gait training;Therapeutic exercise;Balance training;Manual techniques;Passive range of motion;Dry needling   PT Next Visit Plan manual therapy, core strengthening , body mechanics   PT Home Exercise Plan prone extenison   Consulted and Agree with Plan of Care Patient      Patient will benefit from skilled therapeutic intervention in order to improve the following deficits and impairments:  Difficulty walking, Decreased mobility, Impaired sensation, Decreased range of motion, Pain, Impaired flexibility, Decreased strength, Decreased activity tolerance, Abnormal gait  Visit Diagnosis: Low back pain with sciatica, sciatica laterality unspecified, unspecified back pain laterality, unspecified chronicity       G-Codes - Sep 14, 2016 1536    Functional Assessment Tool Used clinical judgement   Functional Limitation Mobility: Walking and moving around   Mobility: Walking and Moving  Around Current Status [phone]) At least 20 percent but less than 40 percent impaired, limited or restricted   Mobility: Walking and Moving Around Goal Status [phone]) At least 1 percent but less than 20 percent impaired, limited or restricted   Mobility: Walking and Moving Around Discharge Status [id number]) 0 percent impaired, limited or restricted      Problem List Patient Active Problem List   Diagnosis Date Noted  . Menopausal syndrome on hormone replacement therapy 04/18/2015  . History of postmenopausal bleeding 04/18/2015   Alanson Puls, PT, DPT [place] S 09/14/16, 3:37 PM  [place] MAIN [facility] SERVICES [address] [place], [place], [address] Phone: [phone]   Fax:  [phone]  Name: Alexis Medina MRN: [id number] Date of Birth: 07-09-1946

## 2016-09-10 ENCOUNTER — Ambulatory Visit: Payer: Medicare Other | Admitting: Physical Therapy

## 2016-09-17 ENCOUNTER — Encounter: Payer: Medicare Other | Admitting: Physical Therapy

## 2016-09-19 ENCOUNTER — Encounter: Payer: Medicare Other | Admitting: Physical Therapy

## 2016-12-30 ENCOUNTER — Ambulatory Visit: Payer: Medicare Other | Admitting: Family Medicine

## 2017-01-01 ENCOUNTER — Ambulatory Visit: Payer: Medicare Other

## 2017-01-01 ENCOUNTER — Ambulatory Visit: Payer: Medicare Other | Admitting: Family Medicine

## 2017-01-02 ENCOUNTER — Ambulatory Visit (INDEPENDENT_AMBULATORY_CARE_PROVIDER_SITE_OTHER): Payer: Medicare Other

## 2017-01-02 ENCOUNTER — Encounter: Payer: Self-pay | Admitting: Family Medicine

## 2017-01-02 ENCOUNTER — Ambulatory Visit (INDEPENDENT_AMBULATORY_CARE_PROVIDER_SITE_OTHER): Payer: Medicare Other | Admitting: Family Medicine

## 2017-01-02 VITALS — BP 118/68 | HR 68 | Temp 97.8°F | Ht 63.0 in | Wt 118.8 lb

## 2017-01-02 VITALS — BP 118/68 | HR 68 | Temp 97.8°F | Resp 16 | Ht 63.0 in | Wt 118.0 lb

## 2017-01-02 DIAGNOSIS — Z0000 Encounter for general adult medical examination without abnormal findings: Secondary | ICD-10-CM | POA: Diagnosis not present

## 2017-01-02 DIAGNOSIS — Z79890 Hormone replacement therapy (postmenopausal): Secondary | ICD-10-CM

## 2017-01-02 DIAGNOSIS — Z23 Encounter for immunization: Secondary | ICD-10-CM

## 2017-01-02 DIAGNOSIS — M8580 Other specified disorders of bone density and structure, unspecified site: Secondary | ICD-10-CM | POA: Diagnosis not present

## 2017-01-02 DIAGNOSIS — M545 Low back pain, unspecified: Secondary | ICD-10-CM

## 2017-01-02 DIAGNOSIS — Z1159 Encounter for screening for other viral diseases: Secondary | ICD-10-CM | POA: Diagnosis not present

## 2017-01-02 DIAGNOSIS — K219 Gastro-esophageal reflux disease without esophagitis: Secondary | ICD-10-CM

## 2017-01-02 DIAGNOSIS — N951 Menopausal and female climacteric states: Secondary | ICD-10-CM | POA: Diagnosis not present

## 2017-01-02 MED ORDER — TRAMADOL HCL 50 MG PO TABS: 50.0000 mg | ORAL_TABLET | Freq: Two times a day (BID) | ORAL | 1 refills | Status: DC | PRN

## 2017-01-02 NOTE — Progress Notes (Signed)
Subjective:   Alexis Medina is a 71 y.o. female who presents for Medicare Annual (Subsequent) preventive examination.  Review of Systems:  N/A  Cardiac Risk Factors include: advanced age (>59men, >2 women)     Objective:     Vitals: BP 118/68 (BP Location: Right Arm)   Pulse 68   Temp 97.8 F (36.6 C) (Oral)   Ht 5\' 3"  (1.6 m)   Wt 118 lb 12.8 oz (53.9 kg)   BMI 21.04 kg/m   Body mass index is 21.04 kg/m.   Tobacco History  Smoking Status  . Never Smoker  Smokeless Tobacco  . Never Used     Counseling given: Not Answered   Past Medical History:  Diagnosis Date  . Hormone replacement therapy   . Osteopenia    Past Surgical History:  Procedure Laterality Date  . TONSILLECTOMY    . TUBAL LIGATION     Family History  Problem Relation Age of Onset  . Heart disease Father   . Alzheimer's disease Mother   . Heart disease Mother   . Heart disease Brother   . Hypertension Brother    History  Sexual Activity  . Sexual activity: Yes  . Birth control/ protection: None, Surgical    Outpatient Encounter Prescriptions as of 01/02/2017  Medication Sig  . aspirin EC 81 MG tablet Take 81 mg by mouth daily.  . cholecalciferol (VITAMIN D) 1000 UNITS tablet Take 1,000 Units by mouth daily.  Marland Kitchen estrogen, conjugated,-medroxyprogesterone (PREMPRO) 0.3-1.5 MG tablet Take 1 tablet by mouth daily.  Marland Kitchen ibuprofen (ADVIL,MOTRIN) 800 MG tablet Take 1 tablet (800 mg total) by mouth every 8 (eight) hours as needed.  . Multiple Vitamin (MULTIVITAMIN) capsule Take 1 capsule by mouth daily.  . ranitidine (ZANTAC) 150 MG capsule Take 1 capsule (150 mg total) by mouth 2 (two) times daily.  . sertraline (ZOLOFT) 50 MG tablet TAKE 1/2 TABLET DAILY AS DIRECTED  . tazarotene (AVAGE) 0.1 % cream   . traMADol (ULTRAM) 50 MG tablet Take 1 tablet (50 mg total) by mouth 2 (two) times daily as needed.  Marland Kitchen VAYACOG 100-19.5-6.5 MG CAPS TAKE ONE CAPSULE BY MOUTH ONCE DAILY  . omeprazole  (PRILOSEC) 20 MG capsule Take 1 capsule (20 mg total) by mouth daily. (Patient not taking: Reported on 01/02/2017)   No facility-administered encounter medications on file as of 01/02/2017.     Activities of Daily Living In your present state of health, do you have any difficulty performing the following activities: 01/02/2017 07/30/2016  Hearing? N N  Vision? N N  Difficulty concentrating or making decisions? Tempie [place]  Walking or climbing stairs? N N  Dressing or bathing? N N  Doing errands, shopping? N N  Preparing Food and eating ? N -  Using the Toilet? N -  In the past six months, have you accidently leaked urine? N -  Do you have problems with loss of bowel control? N -  Managing your Medications? N -  Managing your Finances? N -  Housekeeping or managing your Housekeeping? N -  Some recent data might be hidden    Patient Care Team: Jerrol Banana., MD as PCP - General (Family Medicine) Shon Hough, MD as Consulting Physician (Ophthalmology) Rubie Maid, MD as Referring Physician (Obstetrics and Gynecology)    Assessment:    Exercise Activities and Dietary recommendations Current Exercise Habits: Home exercise routine;Structured exercise class, Type of exercise: walking, Time (Minutes): 45 (walks 3 miles a day weather  depending), Frequency (Times/Week): 7, Weekly Exercise (Minutes/Week): 315, Intensity: Mild, Exercise limited by: None identified  Goals    . Increase water intake          Recommend increasing water intake to 4-5 glasses a day.      Fall Risk Fall Risk  01/02/2017 07/30/2016 06/18/2016  Falls in the past year? No No No   Depression Screen PHQ 2/9 Scores 01/02/2017 07/30/2016  PHQ - 2 Score 0 0     Cognitive Function     6CIT Screen 01/02/2017  What Year? 0 points  What month? 0 points  What time? 0 points  Count back from 20 0 points  Months in reverse 0 points  Repeat phrase 0 points  Total Score 0    Immunization History    Administered Date(s) Administered  . Influenza, High Dose Seasonal PF 08/19/2015, 07/30/2016  . Pneumococcal Conjugate-13 01/02/2017  . Pneumococcal Polysaccharide-23 08/10/2013   Screening Tests Health Maintenance  Topic Date Due  . TETANUS/TDAP  12/19/2017 (Originally 05/29/1965)  . MAMMOGRAM  05/28/2018  . COLONOSCOPY  06/11/2023  . INFLUENZA VACCINE  Completed  . DEXA SCAN  Completed  . Hepatitis C Screening  Completed  . PNA vac Low Risk Adult  Completed      Plan:  I have personally reviewed and addressed the Medicare Annual Wellness questionnaire and have noted the following in the patient's chart:  A. Medical and social history B. Use of alcohol, tobacco or illicit drugs  C. Current medications and supplements D. Functional ability and status E.  Nutritional status F.  Physical activity G. Advance directives H. List of other physicians I.  Hospitalizations, surgeries, and ER visits in previous 12 months J.  [place] such as hearing and vision if needed, cognitive and depression L. Referrals and appointments - none  In addition, I have reviewed and discussed with patient certain preventive protocols, quality metrics, and best practice recommendations. A written personalized care plan for preventive services as well as general preventive health recommendations were provided to patient.  See attached scanned questionnaire for additional information.   Signed,  Fabio Neighbors, LPN Nurse Health Advisor   MD Recommendations: None, pt declined tetanus today. I have done the exam and reviewed the chart and it is accurate to the best of my knowledge. Development worker, community has been used and  any errors in dictation or transcription are unintentional. Miguel Aschoff M.D. [facility]

## 2017-01-02 NOTE — Patient Instructions (Addendum)
Alexis Medina , Thank you for taking time to come for your Medicare Wellness Visit. I appreciate your ongoing commitment to your health goals. Please review the following plan we discussed and let me know if I can assist you in the future.   Screening recommendations/referrals: Colonoscopy: completed 03/2013 Mammogram: completed 11/2016 Bone Density: completed in 2010 Recommended yearly ophthalmology/optometry visit for glaucoma screening and checkup Recommended yearly dental visit for hygiene and checkup  Vaccinations: Influenza vaccine: completed Pneumococcal vaccine: completed series today Tdap vaccine: denied Shingles vaccine: N/A  Advanced directives: Requested copy for our records  Conditions/risks identified: N/A  Next appointment: Today @ 10:00 AM with Dr Rosanna Randy   Preventive Care 71 Years and Older, Female Preventive care refers to lifestyle choices and visits with your health care provider that can promote health and wellness. What does preventive care include?  A yearly physical exam. This is also called an annual well check.  Dental exams once or twice a year.  Routine eye exams. Ask your health care provider how often you should have your eyes checked.  Personal lifestyle choices, including:  Daily care of your teeth and gums.  Regular physical activity.  Eating a healthy diet.  Avoiding tobacco and drug use.  Limiting alcohol use.  Practicing safe sex.  Taking low-dose aspirin every day.  Taking vitamin and mineral supplements as recommended by your health care provider. What happens during an annual well check? The services and screenings done by your health care provider during your annual well check will depend on your age, overall health, lifestyle risk factors, and family history of disease. Counseling  Your health care provider may ask you questions about your:  Alcohol use.  Tobacco use.  Drug use.  Emotional well-being.  Home and  relationship well-being.  Sexual activity.  Eating habits.  History of falls.  Memory and ability to understand (cognition).  Work and work Statistician.  Reproductive health. Screening  You may have the following tests or measurements:  Height, weight, and BMI.  Blood pressure.  Lipid and cholesterol levels. These may be checked every 5 years, or more frequently if you are over 71 years old.  Skin check.  Lung cancer screening. You may have this screening every year starting at age 71 if you have a 30-pack-year history of smoking and currently smoke or have quit within the past 15 years.  Fecal occult blood test (FOBT) of the stool. You may have this test every year starting at age 71.  Flexible sigmoidoscopy or colonoscopy. You may have a sigmoidoscopy every 5 years or a colonoscopy every 10 years starting at age 71.  Hepatitis C blood test.  Hepatitis B blood test.  Sexually transmitted disease (STD) testing.  Diabetes screening. This is done by checking your blood sugar (glucose) after you have not eaten for a while (fasting). You may have this done every 1-3 years.  Bone density scan. This is done to screen for osteoporosis. You may have this done starting at age 71.  Mammogram. This may be done every 1-2 years. Talk to your health care provider about how often you should have regular mammograms. Talk with your health care provider about your test results, treatment options, and if necessary, the need for more tests. Vaccines  Your health care provider may recommend certain vaccines, such as:  Influenza vaccine. This is recommended every year.  Tetanus, diphtheria, and acellular pertussis (Tdap, Td) vaccine. You may need a Td booster every 10 years.  Zoster vaccine.  You may need this after age 71.  Pneumococcal 13-valent conjugate (PCV13) vaccine. One dose is recommended after age 71.  Pneumococcal polysaccharide (PPSV23) vaccine. One dose is recommended after  age 71. Talk to your health care provider about which screenings and vaccines you need and how often you need them. This information is not intended to replace advice given to you by your health care provider. Make sure you discuss any questions you have with your health care provider. Document Released: 11/03/2015 Document Revised: 06/26/2016 Document Reviewed: 08/08/2015 Elsevier Interactive Patient Education  2017 [employer] Prevention in the Home Falls can cause injuries. They can happen to people of all ages. There are many things you can do to make your home safe and to help prevent falls. What can I do on the outside of my home?  Regularly fix the edges of walkways and driveways and fix any cracks.  Remove anything that might make you trip as you walk through a door, such as a raised step or threshold.  Trim any bushes or trees on the path to your home.  Use bright outdoor lighting.  Clear any walking paths of anything that might make someone trip, such as rocks or tools.  Regularly check to see if handrails are loose or broken. Make sure that both sides of any steps have handrails.  Any raised decks and porches should have guardrails on the edges.  Have any leaves, snow, or ice cleared regularly.  Use sand or salt on walking paths during winter.  Clean up any spills in your garage right away. This includes oil or grease spills. What can I do in the bathroom?  Use night lights.  Install grab bars by the toilet and in the tub and shower. Do not use towel bars as grab bars.  Use non-skid mats or decals in the tub or shower.  If you need to sit down in the shower, use a plastic, non-slip stool.  Keep the floor dry. Clean up any water that spills on the floor as soon as it happens.  Remove soap buildup in the tub or shower regularly.  Attach bath mats securely with double-sided non-slip rug tape.  Do not have throw rugs and other things on the floor that can  make you trip. What can I do in the bedroom?  Use night lights.  Make sure that you have a light by your bed that is easy to reach.  Do not use any sheets or blankets that are too big for your bed. They should not hang down onto the floor.  Have a firm chair that has side arms. You can use this for support while you get dressed.  Do not have throw rugs and other things on the floor that can make you trip. What can I do in the kitchen?  Clean up any spills right away.  Avoid walking on wet floors.  Keep items that you use a lot in easy-to-reach places.  If you need to reach something above you, use a strong step stool that has a grab bar.  Keep electrical cords out of the way.  Do not use floor polish or wax that makes floors slippery. If you must use wax, use non-skid floor wax.  Do not have throw rugs and other things on the floor that can make you trip. What can I do with my stairs?  Do not leave any items on the stairs.  Make sure that there are handrails on  both sides of the stairs and use them. Fix handrails that are broken or loose. Make sure that handrails are as long as the stairways.  Check any carpeting to make sure that it is firmly attached to the stairs. Fix any carpet that is loose or worn.  Avoid having throw rugs at the top or bottom of the stairs. If you do have throw rugs, attach them to the floor with carpet tape.  Make sure that you have a light switch at the top of the stairs and the bottom of the stairs. If you do not have them, ask someone to add them for you. What else can I do to help prevent falls?  Wear shoes that:  Do not have high heels.  Have rubber bottoms.  Are comfortable and fit you well.  Are closed at the toe. Do not wear sandals.  If you use a stepladder:  Make sure that it is fully opened. Do not climb a closed stepladder.  Make sure that both sides of the stepladder are locked into place.  Ask someone to hold it for you,  if possible.  Clearly mark and make sure that you can see:  Any grab bars or handrails.  First and last steps.  Where the edge of each step is.  Use tools that help you move around (mobility aids) if they are needed. These include:  Canes.  Walkers.  Scooters.  Crutches.  Turn on the lights when you go into a dark area. Replace any light bulbs as soon as they burn out.  Set up your furniture so you have a clear path. Avoid moving your furniture around.  If any of your floors are uneven, fix them.  If there are any pets around you, be aware of where they are.  Review your medicines with your doctor. Some medicines can make you feel dizzy. This can increase your chance of falling. Ask your doctor what other things that you can do to help prevent falls. This information is not intended to replace advice given to you by your health care provider. Make sure you discuss any questions you have with your health care provider. Document Released: 08/03/2009 Document Revised: 03/14/2016 Document Reviewed: 11/11/2014 Elsevier Interactive Patient Education  2017 [employer].

## 2017-01-02 NOTE — Progress Notes (Signed)
Subjective:  HPI  Pt saw McKenzie for AWE today as well.  Pap-10/22/11 Mammogram- 05/28/16 Colonoscopy- 06/10/2013  Immunization History  Administered Date(s) Administered  . Hepatitis A 04/09/2006  . Influenza, High Dose Seasonal PF 08/19/2015, 07/30/2016  . Pneumococcal Conjugate-13 01/02/2017  . Pneumococcal Polysaccharide-23 08/10/2013  . Yellow Fever 04/09/2006     Pt is here for a 6 month follow up of chronic problems. She reports that she is feeling well other than a cold that she has had for about a week and dealing with her husbands recent medical problems.   Prior to Admission medications   Medication Sig Start Date End Date Taking? Authorizing Provider  aspirin EC 81 MG tablet Take 81 mg by mouth daily.    Historical Provider, MD  cholecalciferol (VITAMIN D) 1000 UNITS tablet Take 1,000 Units by mouth daily.    Historical Provider, MD  estrogen, conjugated,-medroxyprogesterone (PREMPRO) 0.3-1.5 MG tablet Take 1 tablet by mouth daily. 05/10/16   Rubie Maid, MD  ibuprofen (ADVIL,MOTRIN) 800 MG tablet Take 1 tablet (800 mg total) by mouth every 8 (eight) hours as needed. 06/12/16   Ameliyah Sarno Maceo Pro., MD  Multiple Vitamin (MULTIVITAMIN) capsule Take 1 capsule by mouth daily.    Historical Provider, MD  omeprazole (PRILOSEC) 20 MG capsule Take 1 capsule (20 mg total) by mouth daily. Patient not taking: Reported on 01/02/2017 07/30/16   Jerrol Banana., MD  ranitidine (ZANTAC) 150 MG capsule Take 1 capsule (150 mg total) by mouth 2 (two) times daily. 07/30/16   Tanekia Ryans Maceo Pro., MD  sertraline (ZOLOFT) 50 MG tablet TAKE 1/2 TABLET DAILY AS DIRECTED 11/24/14   Historical Provider, MD  tazarotene (AVAGE) 0.1 % cream  03/08/16   Historical Provider, MD  traMADol (ULTRAM) 50 MG tablet Take 1 tablet (50 mg total) by mouth 2 (two) times daily as needed. 04/25/16   Odyn Turko Maceo Pro., MD  VAYACOG 100-19.5-6.5 MG CAPS TAKE ONE CAPSULE BY MOUTH ONCE DAILY 04/08/16   Jerrol Banana., MD    Patient Active Problem List   Diagnosis Date Noted  . Menopausal syndrome on hormone replacement therapy 04/18/2015  . History of postmenopausal bleeding 04/18/2015    Past Medical History:  Diagnosis Date  . Hormone replacement therapy   . Osteopenia     Social History   Social History  . Marital status: Married    Spouse name: N/A  . Number of children: N/A  . Years of education: N/A   Occupational History  . Not on file.   Social History Main Topics  . Smoking status: Never Smoker  . Smokeless tobacco: Never Used  . Alcohol use No  . Drug use: No  . Sexual activity: Yes    Birth control/ protection: None, Surgical   Other Topics Concern  . Not on file   Social History Narrative  . No narrative on file    No Known Allergies  Review of Systems  Constitutional: Positive for malaise/fatigue.  HENT: Positive for sore throat.   Eyes: Negative.   Respiratory: Positive for cough.   Cardiovascular: Negative.   Gastrointestinal: Negative.   Genitourinary: Negative.   Musculoskeletal: Negative.   Skin: Negative.   Neurological: Negative.   Endo/Heme/Allergies: Negative.   Psychiatric/Behavioral: Negative.     Immunization History  Administered Date(s) Administered  . Hepatitis A 04/09/2006  . Influenza, High Dose Seasonal PF 08/19/2015, 07/30/2016  . Pneumococcal Conjugate-13 01/02/2017  . Pneumococcal Polysaccharide-23 08/10/2013  .  Yellow Fever 04/09/2006    Objective:  BP 118/68   Pulse 68   Temp 97.8 F (36.6 C)   Resp 16   Ht 5\' 3"  (1.6 m)   Wt 118 lb (53.5 kg)   BMI 20.90 kg/m   Physical Exam  Constitutional: She is oriented to person, place, and time and well-developed, well-nourished, and in no distress.  HENT:  Head: Normocephalic and atraumatic.  Right Ear: External ear normal.  Left Ear: External ear normal.  Nose: Nose normal.  Mouth/Throat: Oropharynx is clear and moist.  Eyes: Conjunctivae and EOM are  normal. Pupils are equal, round, and reactive to light.  Neck: Normal range of motion. Neck supple.  Cardiovascular: Normal rate, regular rhythm, normal heart sounds and intact distal pulses.   Pulmonary/Chest: Effort normal and breath sounds normal.  Abdominal: Soft. Bowel sounds are normal.  Musculoskeletal: Normal range of motion.  Neurological: She is alert and oriented to person, place, and time. She has normal reflexes. Gait normal. GCS score is 15.  Skin: Skin is warm and dry.  Psychiatric: Mood, memory, affect and judgment normal.    Lab Results  Component Value Date   WBC 5.6 07/11/2016   HCT 37.7 07/11/2016   PLT 232 07/11/2016   GLUCOSE 75 07/11/2016   CHOL 215 (H) 07/11/2016   TRIG 54 07/11/2016   HDL 97 07/11/2016   LDLCALC 107 (H) 07/11/2016   TSH 1.520 07/11/2016    CMP     Component Value Date/Time   NA 136 07/11/2016 1058   K 4.5 07/11/2016 1058   CL 97 07/11/2016 1058   CO2 22 07/11/2016 1058   GLUCOSE 75 07/11/2016 1058   BUN 9 07/11/2016 1058   CREATININE 0.84 07/11/2016 1058   CALCIUM 9.6 07/11/2016 1058   PROT 7.0 07/11/2016 1058   ALBUMIN 4.3 07/11/2016 1058   AST 27 07/11/2016 1058   ALT 15 07/11/2016 1058   ALKPHOS 53 07/11/2016 1058   BILITOT 0.6 07/11/2016 1058   GFRNONAA 71 07/11/2016 1058   GFRAA 81 07/11/2016 1058    Assessment and Plan :  1. Gastroesophageal reflux disease without esophagitis   2. Osteopenia, unspecified location  - DG Bone Density; Future  3. Midline low back pain without sciatica, unspecified chronicity   4. Menopausal syndrome on hormone replacement therapy   HPI, Exam, and A&P Transcribed under the direction and in the presence of Sarafina Puthoff L. Cranford Mon, MD  Electronically Signed: Katina Dung, [place] MD [facility] 01/02/2017 10:50 AM

## 2017-01-03 LAB — HEPATITIS C ANTIBODY: Hep C Virus Ab: <0.1 s/co ratio (ref 0.0–0.9)

## 2017-01-20 ENCOUNTER — Ambulatory Visit
Admission: RE | Admit: 2017-01-20 | Discharge: 2017-01-20 | Disposition: A | Discharge status: Home / Self Care | Payer: Medicare Other | Source: Ambulatory Visit | Attending: Family Medicine | Admitting: Family Medicine

## 2017-01-20 DIAGNOSIS — M810 Age-related osteoporosis without current pathological fracture: Secondary | ICD-10-CM | POA: Insufficient documentation

## 2017-01-20 DIAGNOSIS — Z13820 Encounter for screening for osteoporosis: Secondary | ICD-10-CM | POA: Insufficient documentation

## 2017-01-20 DIAGNOSIS — M8580 Other specified disorders of bone density and structure, unspecified site: Secondary | ICD-10-CM

## 2017-01-21 ENCOUNTER — Other Ambulatory Visit: Payer: Self-pay

## 2017-01-21 MED ORDER — ALENDRONATE SODIUM 70 MG PO TABS: 70.0000 mg | ORAL_TABLET | ORAL | 11 refills | Status: DC

## 2017-04-21 DIAGNOSIS — D2261 Melanocytic nevi of right upper limb, including shoulder: Secondary | ICD-10-CM | POA: Diagnosis not present

## 2017-04-21 DIAGNOSIS — X32XXXA Exposure to sunlight, initial encounter: Secondary | ICD-10-CM | POA: Diagnosis not present

## 2017-04-21 DIAGNOSIS — L570 Actinic keratosis: Secondary | ICD-10-CM | POA: Diagnosis not present

## 2017-04-21 DIAGNOSIS — L814 Other melanin hyperpigmentation: Secondary | ICD-10-CM | POA: Diagnosis not present

## 2017-04-21 DIAGNOSIS — D225 Melanocytic nevi of trunk: Secondary | ICD-10-CM | POA: Diagnosis not present

## 2017-04-21 DIAGNOSIS — L820 Inflamed seborrheic keratosis: Secondary | ICD-10-CM | POA: Diagnosis not present

## 2017-04-21 DIAGNOSIS — L821 Other seborrheic keratosis: Secondary | ICD-10-CM | POA: Diagnosis not present

## 2017-05-06 ENCOUNTER — Other Ambulatory Visit: Payer: Self-pay | Admitting: Family Medicine

## 2017-05-12 ENCOUNTER — Other Ambulatory Visit: Payer: Self-pay | Admitting: Family Medicine

## 2017-05-12 MED ORDER — ALENDRONATE SODIUM 70 MG PO TABS: 70.0000 mg | ORAL_TABLET | ORAL | 3 refills | Status: DC

## 2017-05-12 NOTE — Telephone Encounter (Signed)
[facility] faxed a 90-days supply for the following medication. Thanks CC  alendronate (FOSAMAX) 70 MG tablet

## 2017-07-04 ENCOUNTER — Other Ambulatory Visit: Payer: Self-pay | Admitting: Obstetrics and Gynecology

## 2017-07-04 DIAGNOSIS — Z79890 Hormone replacement therapy (postmenopausal): Principal | ICD-10-CM

## 2017-07-04 DIAGNOSIS — N951 Menopausal and female climacteric states: Secondary | ICD-10-CM

## 2017-07-07 ENCOUNTER — Other Ambulatory Visit: Payer: Self-pay | Admitting: Family Medicine

## 2017-07-07 ENCOUNTER — Ambulatory Visit (INDEPENDENT_AMBULATORY_CARE_PROVIDER_SITE_OTHER): Payer: Medicare Other | Admitting: Family Medicine

## 2017-07-07 VITALS — BP 100/60 | HR 64 | Resp 16 | Wt 121.0 lb

## 2017-07-07 DIAGNOSIS — R5383 Other fatigue: Secondary | ICD-10-CM

## 2017-07-07 DIAGNOSIS — E785 Hyperlipidemia, unspecified: Secondary | ICD-10-CM | POA: Diagnosis not present

## 2017-07-07 DIAGNOSIS — K219 Gastro-esophageal reflux disease without esophagitis: Secondary | ICD-10-CM

## 2017-07-07 LAB — LIPID PANEL
CHOLESTEROL: 233 mg/dL — AB (ref <200)
HDL: 96 mg/dL (ref >50)
LDL Cholesterol (Calc): 121 mg/dL (calc) — ABNORMAL HIGH
NON-HDL CHOLESTEROL (CALC): 137 mg/dL — AB (ref <130)
TRIGLYCERIDES: 67 mg/dL (ref <150)
Total CHOL/HDL Ratio: 2.4 (calc) (ref <5.0)

## 2017-07-07 LAB — CBC WITH DIFFERENTIAL/PLATELET
Basophils Absolute: 41 cells/uL (ref 0–200)
Basophils Relative: 0.7 %
EOS ABS: 112 {cells}/uL (ref 15–500)
Eosinophils Relative: 1.9 %
HCT: 39.2 % (ref 35.0–45.0)
Hemoglobin: 13.1 g/dL (ref 11.7–15.5)
Lymphs Abs: 2543 cells/uL (ref 850–3900)
MCH: 30.3 pg (ref 27.0–33.0)
MCHC: 33.4 g/dL (ref 32.0–36.0)
MCV: 90.5 fL (ref 80.0–100.0)
MPV: 11.5 fL (ref 7.5–12.5)
Monocytes Relative: 8.6 %
Neutro Abs: 2696 cells/uL (ref 1500–7800)
Neutrophils Relative %: 45.7 %
PLATELETS: 222 10*3/uL (ref 140–400)
RBC: 4.33 10*6/uL (ref 3.80–5.10)
RDW: 12.5 % (ref 11.0–15.0)
TOTAL LYMPHOCYTE: 43.1 %
WBC: 5.9 10*3/uL (ref 3.8–10.8)
WBCMIX: 507 {cells}/uL (ref 200–950)

## 2017-07-07 LAB — COMPLETE METABOLIC PANEL WITH GFR
AG RATIO: 1.8 (calc) (ref 1.0–2.5)
ALKALINE PHOSPHATASE (APISO): 38 U/L (ref 33–130)
ALT: 12 U/L (ref 6–29)
AST: 21 U/L (ref 10–35)
Albumin: 4.6 g/dL (ref 3.6–5.1)
BUN: 11 mg/dL (ref 7–25)
CO2: 27 mmol/L (ref 20–32)
Calcium: 10 mg/dL (ref 8.6–10.4)
Chloride: 101 mmol/L (ref 98–110)
Creat: 0.75 mg/dL (ref 0.60–0.93)
GFR, EST NON AFRICAN AMERICAN: 80 mL/min/{1.73_m2} (ref >=60)
GFR, Est African American: 93 mL/min/{1.73_m2} (ref >=60)
GLOBULIN: 2.6 g/dL (ref 1.9–3.7)
Glucose, Bld: 83 mg/dL (ref 65–139)
POTASSIUM: 4.4 mmol/L (ref 3.5–5.3)
SODIUM: 137 mmol/L (ref 135–146)
Total Bilirubin: 0.6 mg/dL (ref 0.2–1.2)
Total Protein: 7.2 g/dL (ref 6.1–8.1)

## 2017-07-07 LAB — TSH: TSH: 1.72 mIU/L (ref 0.40–4.50)

## 2017-07-07 NOTE — Progress Notes (Signed)
Alexis Medina  MRN: [id number] DOB: 03-05-1946  Subjective:  HPI   The patient is a 71 year old who presents for her 6 month follow up. The patient has history of osteoporosis on her last BMD.  She complains of back pain for which she had physical therapy and admits that when she is diligent about her exercises it does help.   She has complaint of some ear discomfort.  She states it is not significant pain but can tell something just isn't right with it.   She also states that since her husband has been diagnosed with A Fib she has a hard time keeping him positive.  She said that he reads all kinds of things on the internet and thinks he has it.  She is becoming worn out and feels a little down from this.    Patient Active Problem List   Diagnosis Date Noted  . Menopausal syndrome on hormone replacement therapy 04/18/2015  . History of postmenopausal bleeding 04/18/2015    Past Medical History:  Diagnosis Date  . Hormone replacement therapy   . Osteopenia     Social History   Social History  . Marital status: Married    Spouse name: N/A  . Number of children: N/A  . Years of education: N/A   Occupational History  . Not on file.   Social History Main Topics  . Smoking status: Never Smoker  . Smokeless tobacco: Never Used  . Alcohol use No  . Drug use: No  . Sexual activity: Yes    Birth control/ protection: None, Surgical   Other Topics Concern  . Not on file   Social History Narrative  . No narrative on file    Outpatient Encounter Prescriptions as of 07/07/2017  Medication Sig Note  . alendronate (FOSAMAX) 70 MG tablet Take 1 tablet (70 mg total) by mouth every 7 (seven) days. Take with a full glass of water on an empty stomach.   Marland Kitchen aspirin EC 81 MG tablet Take 81 mg by mouth daily.   . cholecalciferol (VITAMIN D) 1000 UNITS tablet Take 1,000 Units by mouth daily.   Marland Kitchen ibuprofen (ADVIL,MOTRIN) 800 MG tablet Take 1 tablet (800 mg total) by mouth every 8 (eight)  hours as needed.   . Multiple Vitamin (MULTIVITAMIN) capsule Take 1 capsule by mouth daily.   Marland Kitchen PREMPRO 0.3-1.5 MG tablet TAKE 1 TABLET DAILY   . ranitidine (ZANTAC) 150 MG capsule Take 1 capsule (150 mg total) by mouth 2 (two) times daily.   . sertraline (ZOLOFT) 50 MG tablet TAKE 1/2 TABLET DAILY AS DIRECTED 04/18/2015: Received from: [facility]  . tazarotene (AVAGE) 0.1 % cream  04/18/2016: Received from: External Pharmacy  . traMADol (ULTRAM) 50 MG tablet Take 1 tablet (50 mg total) by mouth 2 (two) times daily as needed.   Marland Kitchen VAYACOG 100-19.5-6.5 MG CAPS TAKE ONE CAPSULE BY MOUTH ONCE DAILY   . [DISCONTINUED] omeprazole (PRILOSEC) 20 MG capsule Take 1 capsule (20 mg total) by mouth daily. (Patient not taking: Reported on 01/02/2017)    No facility-administered encounter medications on file as of 07/07/2017.     No Known Allergies  Review of Systems  HENT: Positive for ear pain. Negative for congestion, ear discharge, hearing loss, nosebleeds, sinus pain, sore throat and tinnitus.   Eyes: Negative for blurred vision, double vision, photophobia, pain, discharge and redness.  Respiratory: Negative for cough, shortness of breath and wheezing.   Cardiovascular: Negative for chest  pain, palpitations, orthopnea and leg swelling.  Musculoskeletal: Positive for back pain. Negative for falls, joint pain, myalgias and neck pain.  Neurological: Negative for dizziness and headaches.  Endo/Heme/Allergies: Negative.   Psychiatric/Behavioral: Negative.     Objective:  BP 100/60 (BP Location: Right Arm, Patient Position: Sitting, Cuff Size: Normal)   Pulse 64   Resp 16   Wt 121 lb (54.9 kg)   BMI 21.43 kg/m   Physical Exam  Constitutional: She is oriented to person, place, and time and well-developed, well-nourished, and in no distress.  HENT:  Head: Normocephalic and atraumatic.  Eyes: Pupils are equal, round, and reactive to light. Conjunctivae are normal. No scleral icterus.   Neck: Normal range of motion. No thyromegaly present.  Cardiovascular: Normal rate, regular rhythm and normal heart sounds.   Pulmonary/Chest: Effort normal and breath sounds normal.  Abdominal: Soft.  Neurological: She is alert and oriented to person, place, and time.  Skin: Skin is warm and dry.  Psychiatric: Mood, memory, affect and judgment normal.    Assessment and Plan :   1. Hyperlipidemia, unspecified hyperlipidemia type  - Comprehensive metabolic panel - Lipid Panel With LDL/HDL Ratio  2. Other fatigue  - CBC with Differential/Platelet - Comprehensive metabolic panel - TSH 3.Otalgia Normal exam.  HPI, Exam and A&P Transcribed under the direction and in the presence of [name]., MD. Electronically Signed: Althea Charon, RMA I have done the exam and reviewed the chart and it is accurate to the best of my knowledge. Development worker, community has been used and  any errors in dictation or transcription are unintentional. Miguel Aschoff M.D. [facility]

## 2017-07-14 ENCOUNTER — Other Ambulatory Visit: Payer: Self-pay | Admitting: Family Medicine

## 2017-07-14 DIAGNOSIS — Z1231 Encounter for screening mammogram for malignant neoplasm of breast: Secondary | ICD-10-CM

## 2017-07-17 ENCOUNTER — Ambulatory Visit (INDEPENDENT_AMBULATORY_CARE_PROVIDER_SITE_OTHER): Payer: Medicare Other

## 2017-07-17 DIAGNOSIS — Z23 Encounter for immunization: Secondary | ICD-10-CM | POA: Diagnosis not present

## 2017-07-21 DIAGNOSIS — L820 Inflamed seborrheic keratosis: Secondary | ICD-10-CM | POA: Diagnosis not present

## 2017-07-21 DIAGNOSIS — L821 Other seborrheic keratosis: Secondary | ICD-10-CM | POA: Diagnosis not present

## 2017-07-21 DIAGNOSIS — L988 Other specified disorders of the skin and subcutaneous tissue: Secondary | ICD-10-CM | POA: Diagnosis not present

## 2017-07-21 DIAGNOSIS — L814 Other melanin hyperpigmentation: Secondary | ICD-10-CM | POA: Diagnosis not present

## 2017-07-21 DIAGNOSIS — L570 Actinic keratosis: Secondary | ICD-10-CM | POA: Diagnosis not present

## 2017-07-24 ENCOUNTER — Ambulatory Visit
Admission: RE | Admit: 2017-07-24 | Discharge: 2017-07-24 | Disposition: A | Discharge status: Home / Self Care | Payer: Medicare Other | Source: Ambulatory Visit | Attending: Family Medicine | Admitting: Family Medicine

## 2017-07-24 DIAGNOSIS — Z1231 Encounter for screening mammogram for malignant neoplasm of breast: Secondary | ICD-10-CM | POA: Diagnosis not present

## 2017-11-03 DIAGNOSIS — H04123 Dry eye syndrome of bilateral lacrimal glands: Secondary | ICD-10-CM | POA: Diagnosis not present

## 2017-11-03 DIAGNOSIS — H26491 Other secondary cataract, right eye: Secondary | ICD-10-CM | POA: Diagnosis not present

## 2017-11-03 DIAGNOSIS — H5211 Myopia, right eye: Secondary | ICD-10-CM | POA: Diagnosis not present

## 2017-11-03 DIAGNOSIS — H43813 Vitreous degeneration, bilateral: Secondary | ICD-10-CM | POA: Diagnosis not present

## 2017-11-18 ENCOUNTER — Other Ambulatory Visit: Payer: Self-pay | Admitting: Family Medicine

## 2017-11-18 MED ORDER — TRAMADOL HCL 50 MG PO TABS: 50.0000 mg | ORAL_TABLET | Freq: Two times a day (BID) | ORAL | 1 refills | Status: DC | PRN

## 2017-11-18 NOTE — Telephone Encounter (Signed)
Patient is requesting refill on her traMADol (ULTRAM) 50 MG tablet    [facility]

## 2017-11-21 ENCOUNTER — Telehealth: Payer: Self-pay | Admitting: Family Medicine

## 2017-11-21 NOTE — Telephone Encounter (Signed)
Pt requesting refill of traMADol (ULTRAM) 50 MG tablet to be sent to [facility]. Pt called to get a status update if the Rx was sent to [facility]. I saw the medication was approved and printed on 11/18/17 but didn't see if it was faxed and if so which pharmacy. Please advise. Thanks TNP

## 2017-11-21 NOTE — Telephone Encounter (Signed)
LMTCB. Alexis Medina is faxing rx over again. It was originally faxed to [facility].

## 2017-11-24 ENCOUNTER — Telehealth: Payer: Self-pay | Admitting: Family Medicine

## 2017-11-24 NOTE — Telephone Encounter (Signed)
Patient advised.

## 2017-11-24 NOTE — Telephone Encounter (Signed)
Jeneen Rinks w/ [facility] has called asking to reduce the quantity of Ultram from #180 to #135 because the patient is not using #180 in a 3 mth period. Its been lasting her from 4-6 mths.    The reference # to this call is [id number]. Please call [facility] at [phone]

## 2017-11-24 NOTE — Telephone Encounter (Signed)
Ok to reduce quantity? Please advise. Thanks!

## 2017-11-25 NOTE — Telephone Encounter (Signed)
Ok thx.

## 2017-11-25 NOTE — Telephone Encounter (Signed)
Pt wanting to know the statis of the request.  I informed pt we are working on the request.

## 2017-11-26 MED ORDER — TRAMADOL HCL 50 MG PO TABS: 50.0000 mg | ORAL_TABLET | Freq: Two times a day (BID) | ORAL | 1 refills | Status: DC | PRN

## 2017-11-26 NOTE — Telephone Encounter (Signed)
[facility]. Pt informed.

## 2017-12-11 DIAGNOSIS — H26491 Other secondary cataract, right eye: Secondary | ICD-10-CM | POA: Diagnosis not present

## 2018-01-09 ENCOUNTER — Ambulatory Visit (INDEPENDENT_AMBULATORY_CARE_PROVIDER_SITE_OTHER): Payer: Medicare Other

## 2018-01-09 VITALS — BP 108/50 | HR 68 | Temp 98.0°F | Ht 63.0 in | Wt 120.2 lb

## 2018-01-09 DIAGNOSIS — Z0000 Encounter for general adult medical examination without abnormal findings: Secondary | ICD-10-CM

## 2018-01-09 NOTE — Progress Notes (Signed)
Subjective:   [employer] [employer] is a 72 y.o. female who presents for Medicare Annual (Subsequent) preventive examination.  Review of Systems:  N/A  Cardiac Risk Factors include: advanced age (>70men, >36 women);dyslipidemia     Objective:     Vitals: BP (!) 108/50 (BP Location: Left Arm)   Pulse 68   Temp 98 F (36.7 C) (Oral)   Ht 5\' 3"  (1.6 m)   Wt 120 lb 3.2 oz (54.5 kg)   BMI 21.29 kg/m   Body mass index is 21.29 kg/m.  Advanced Directives 01/09/2018 01/02/2017 08/07/2016  Does Patient Have a Medical Advance Directive? Yes Yes No  Type of Paramedic of [place];Living will Living will;Healthcare Power of Attorney -  Copy of [place] in Chart? No - copy requested No - copy requested -    Tobacco Social History   Tobacco Use  Smoking Status Never Smoker  Smokeless Tobacco Never Used     Counseling given: Not Answered   Clinical Intake:  Pre-visit preparation completed: Yes  Pain : No/denies pain Pain Score: 0-No pain     Nutritional Status: BMI of 19-24  Normal Nutritional Risks: None Diabetes: No  How often do you need to have someone help you when you read instructions, pamphlets, or other written materials from your doctor or pharmacy?: 1 - Never  Interpreter Needed?: No  Information entered by :: [place], LPN  Past Medical History:  Diagnosis Date  . Hormone replacement therapy   . Hyperlipidemia   . Osteopenia    Past Surgical History:  Procedure Laterality Date  . TONSILLECTOMY    . TUBAL LIGATION     Family History  Problem Relation Age of Onset  . Heart disease Father   . Alzheimer's disease Mother   . Heart disease Mother   . Heart disease Brother   . Hypertension Brother   . Breast cancer Cousin    Social History   Socioeconomic History  . Marital status: Married    Spouse name: Not on file  . Number of children: 1  . Years of education: Not on file  . Highest education level:  Bachelor's degree (e.g., BA, AB, BS)  Occupational History  . Occupation: part time - Therapist, sports @ [employer]  . Financial resource strain: Not on file  . Food insecurity:    Worry: Never true    Inability: Never true  . Transportation needs:    Medical: No    Non-medical: No  Tobacco Use  . Smoking status: Never Smoker  . Smokeless tobacco: Never Used  Substance and Sexual Activity  . Alcohol use: No    Frequency: Never    Comment: rare  . Drug use: No  . Sexual activity: Yes    Birth control/protection: None, Surgical  Lifestyle  . Physical activity:    Days per week: Not on file    Minutes per session: Not on file  . Stress: Only a little  Relationships  . Social connections:    Talks on phone: Not on file    Gets together: Not on file    Attends religious service: Not on file    Active member of club or organization: Not on file    Attends meetings of clubs or organizations: Not on file    Relationship status: Not on file  Other Topics Concern  . Not on file  Social History Narrative  . Not on file  Outpatient Encounter Medications as of 01/09/2018  Medication Sig  . alendronate (FOSAMAX) 70 MG tablet Take 1 tablet (70 mg total) by mouth every 7 (seven) days. Take with a full glass of water on an empty stomach.  Marland Kitchen aspirin EC 81 MG tablet Take 81 mg by mouth every other day.   . cholecalciferol (VITAMIN D) 1000 UNITS tablet Take 1,000 Units by mouth daily.  Marland Kitchen ibuprofen (ADVIL,MOTRIN) 800 MG tablet Take 1 tablet (800 mg total) by mouth every 8 (eight) hours as needed.  . Multiple Vitamin (MULTIVITAMIN) capsule Take 1 capsule by mouth daily.  Marland Kitchen PREMPRO 0.3-1.5 MG tablet TAKE 1 TABLET DAILY  . ranitidine (ZANTAC) 150 MG capsule TAKE 1 CAPSULE TWICE A DAY  . sertraline (ZOLOFT) 50 MG tablet TAKE 1/2 TABLET DAILY AS DIRECTED  . tazarotene (AVAGE) 0.1 % cream   . traMADol (ULTRAM) 50 MG tablet Take 1 tablet (50 mg total) by mouth 2 (two) times daily as needed.  (Patient taking differently: Take 50 mg by mouth 2 (two) times daily. )  . VAYACOG 100-19.5-6.5 MG CAPS TAKE ONE CAPSULE BY MOUTH ONCE DAILY   No facility-administered encounter medications on file as of 01/09/2018.     Activities of Daily Living In your present state of health, do you have any difficulty performing the following activities: 01/09/2018  Hearing? N  Vision? N  Difficulty concentrating or making decisions? N  Walking or climbing stairs? N  Dressing or bathing? N  Doing errands, shopping? N  Preparing Food and eating ? N  Using the Toilet? N  In the past six months, have you accidently leaked urine? N  Do you have problems with loss of bowel control? N  Managing your Medications? N  Managing your Finances? N  Housekeeping or managing your Housekeeping? N  Some recent data might be hidden    Patient Care Team: Jerrol Banana., MD as PCP - General (Family Medicine) Shon Hough, MD as Consulting Physician (Ophthalmology) Rubie Maid, MD as Referring Physician (Obstetrics and Gynecology)    Assessment:   This is a routine wellness examination for [employer].  Exercise Activities and Dietary recommendations Current Exercise Habits: Home exercise routine, Type of exercise: walking;stretching, Time (Minutes): 45, Frequency (Times/Week): 7, Weekly Exercise (Minutes/Week): 315, Intensity: Mild  Goals    . DIET - INCREASE WATER INTAKE     Recommend increasing water intake to 4-6 glasses a day.        Fall Risk Fall Risk  01/09/2018 01/02/2017 07/30/2016 06/18/2016  Falls in the past year? No No No No  Comment - - - Emmi Telephone Survey: data to providers prior to load   Is the patient's home free of loose throw rugs in walkways, pet beds, electrical cords, etc?   yes      Grab bars in the bathroom? yes      Handrails on the stairs?   yes      Adequate lighting?   yes  Timed Get Up and Go performed: N/A  Depression Screen PHQ 2/9 Scores 01/09/2018  01/02/2017 07/30/2016  PHQ - 2 Score 0 0 0     Cognitive Function: Pt declined screening today.      6CIT Screen 01/02/2017  What Year? 0 points  What month? 0 points  What time? 0 points  Count back from 20 0 points  Months in reverse 0 points  Repeat phrase 0 points  Total Score 0    Immunization History  Administered Date(s) Administered  .  Hepatitis A 04/09/2006  . Influenza, High Dose Seasonal PF 08/19/2015, 07/30/2016, 07/17/2017  . Pneumococcal Conjugate-13 01/02/2017  . Pneumococcal Polysaccharide-23 08/10/2013  . Yellow Fever 04/09/2006    Qualifies for Shingles Vaccine? Due for Shingles vaccine. Declined my offer to administer today. Education has been provided regarding the importance of this vaccine. Pt has been advised to call her insurance company to determine her out of pocket expense. Advised she may also receive this vaccine at her local pharmacy or Health Dept. Verbalized acceptance and understanding.  Screening Tests Health Maintenance  Topic Date Due  . TETANUS/TDAP  05/29/1965  . MAMMOGRAM  07/25/2019  . COLONOSCOPY  06/11/2023  . INFLUENZA VACCINE  Completed  . DEXA SCAN  Completed  . Hepatitis C Screening  Completed  . PNA vac Low Risk Adult  Completed    Cancer Screenings: Lung: Low Dose CT Chest recommended if Age 59-80 years, 30 pack-year currently smoking OR have quit w/in 15years. Patient does not qualify. Breast:  Up to date on Mammogram? Yes   Up to date of Bone Density/Dexa? Yes Colorectal: Up to date  Additional Screenings:  Hepatitis C Screening: Up to date     Plan:  I have personally reviewed and addressed the Medicare Annual Wellness questionnaire and have noted the following in the patient's chart:  A. Medical and social history B. Use of alcohol, tobacco or illicit drugs  C. Current medications and supplements D. Functional ability and status E.  Nutritional status F.  Physical activity G. Advance directives H. List of  other physicians I.  Hospitalizations, surgeries, and ER visits in previous 12 months J.  [place] such as hearing and vision if needed, cognitive and depression L. Referrals and appointments - none  In addition, I have reviewed and discussed with patient certain preventive protocols, quality metrics, and best practice recommendations. A written personalized care plan for preventive services as well as general preventive health recommendations were provided to patient.  See attached scanned questionnaire for additional information.   Signed,  Fabio Neighbors, LPN Nurse Health Advisor   Nurse Recommendations:

## 2018-01-09 NOTE — Patient Instructions (Addendum)
[employer] , Thank you for taking time to come for your Medicare Wellness Visit. I appreciate your ongoing commitment to your health goals. Please review the following plan we discussed and let me know if I can assist you in the future.   Screening recommendations/referrals: Colonoscopy: Up to date Mammogram: Up to date Bone Density: Up to date Recommended yearly ophthalmology/optometry visit for glaucoma screening and checkup Recommended yearly dental visit for hygiene and checkup  Vaccinations: Influenza vaccine: Up to date Pneumococcal vaccine: Up to date Tdap vaccine: Pt declines today.  Shingles vaccine: Pt declines today.     Advanced directives: Please bring a copy of your POA (Power of Attorney) and/or Living Will to your next appointment.   Conditions/risks identified: Recommend increasing water intake to 4-6 glasses a day.   Next appointment: 01/13/18 @ 9:40 AM   Preventive Care 65 Years and Older, Female Preventive care refers to lifestyle choices and visits with your health care provider that can promote health and wellness. What does preventive care include?  A yearly physical exam. This is also called an annual well check.  Dental exams once or twice a year.  Routine eye exams. Ask your health care provider how often you should have your eyes checked.  Personal lifestyle choices, including:  Daily care of your teeth and gums.  Regular physical activity.  Eating a healthy diet.  Avoiding tobacco and drug use.  Limiting alcohol use.  Practicing safe sex.  Taking low-dose aspirin every day.  Taking vitamin and mineral supplements as recommended by your health care provider. What happens during an annual well check? The services and screenings done by your health care provider during your annual well check will depend on your age, overall health, lifestyle risk factors, and family history of disease. Counseling  Your health care provider may ask you  questions about your:  Alcohol use.  Tobacco use.  Drug use.  Emotional well-being.  Home and relationship well-being.  Sexual activity.  Eating habits.  History of falls.  Memory and ability to understand (cognition).  Work and work Statistician.  Reproductive health. Screening  You may have the following tests or measurements:  Height, weight, and BMI.  Blood pressure.  Lipid and cholesterol levels. These may be checked every 5 years, or more frequently if you are over 79 years old.  Skin check.  Lung cancer screening. You may have this screening every year starting at age 17 if you have a 30-pack-year history of smoking and currently smoke or have quit within the past 15 years.  Fecal occult blood test (FOBT) of the stool. You may have this test every year starting at age 64.  Flexible sigmoidoscopy or colonoscopy. You may have a sigmoidoscopy every 5 years or a colonoscopy every 10 years starting at age 84.  Hepatitis C blood test.  Hepatitis B blood test.  Sexually transmitted disease (STD) testing.  Diabetes screening. This is done by checking your blood sugar (glucose) after you have not eaten for a while (fasting). You may have this done every 1-3 years.  Bone density scan. This is done to screen for osteoporosis. You may have this done starting at age 74.  Mammogram. This may be done every 1-2 years. Talk to your health care provider about how often you should have regular mammograms. Talk with your health care provider about your test results, treatment options, and if necessary, the need for more tests. Vaccines  Your health care provider may recommend certain vaccines,  such as:  Influenza vaccine. This is recommended every year.  Tetanus, diphtheria, and acellular pertussis (Tdap, Td) vaccine. You may need a Td booster every 10 years.  Zoster vaccine. You may need this after age 66.  Pneumococcal 13-valent conjugate (PCV13) vaccine. One dose is  recommended after age 28.  Pneumococcal polysaccharide (PPSV23) vaccine. One dose is recommended after age 72. Talk to your health care provider about which screenings and vaccines you need and how often you need them. This information is not intended to replace advice given to you by your health care provider. Make sure you discuss any questions you have with your health care provider. Document Released: 11/03/2015 Document Revised: 06/26/2016 Document Reviewed: 08/08/2015 Elsevier Interactive Patient Education  2017 [place] Prevention in the Home Falls can cause injuries. They can happen to people of all ages. There are many things you can do to make your home safe and to help prevent falls. What can I do on the outside of my home?  Regularly fix the edges of walkways and driveways and fix any cracks.  Remove anything that might make you trip as you walk through a door, such as a raised step or threshold.  Trim any bushes or trees on the path to your home.  Use bright outdoor lighting.  Clear any walking paths of anything that might make someone trip, such as rocks or tools.  Regularly check to see if handrails are loose or broken. Make sure that both sides of any steps have handrails.  Any raised decks and porches should have guardrails on the edges.  Have any leaves, snow, or ice cleared regularly.  Use sand or salt on walking paths during winter.  Clean up any spills in your garage right away. This includes oil or grease spills. What can I do in the bathroom?  Use night lights.  Install grab bars by the toilet and in the tub and shower. Do not use towel bars as grab bars.  Use non-skid mats or decals in the tub or shower.  If you need to sit down in the shower, use a plastic, non-slip stool.  Keep the floor dry. Clean up any water that spills on the floor as soon as it happens.  Remove soap buildup in the tub or shower regularly.  Attach bath mats  securely with double-sided non-slip rug tape.  Do not have throw rugs and other things on the floor that can make you trip. What can I do in the bedroom?  Use night lights.  Make sure that you have a light by your bed that is easy to reach.  Do not use any sheets or blankets that are too big for your bed. They should not hang down onto the floor.  Have a firm chair that has side arms. You can use this for support while you get dressed.  Do not have throw rugs and other things on the floor that can make you trip. What can I do in the kitchen?  Clean up any spills right away.  Avoid walking on wet floors.  Keep items that you use a lot in easy-to-reach places.  If you need to reach something above you, use a strong step stool that has a grab bar.  Keep electrical cords out of the way.  Do not use floor polish or wax that makes floors slippery. If you must use wax, use non-skid floor wax.  Do not have throw rugs and other things on  the floor that can make you trip. What can I do with my stairs?  Do not leave any items on the stairs.  Make sure that there are handrails on both sides of the stairs and use them. Fix handrails that are broken or loose. Make sure that handrails are as long as the stairways.  Check any carpeting to make sure that it is firmly attached to the stairs. Fix any carpet that is loose or worn.  Avoid having throw rugs at the top or bottom of the stairs. If you do have throw rugs, attach them to the floor with carpet tape.  Make sure that you have a light switch at the top of the stairs and the bottom of the stairs. If you do not have them, ask someone to add them for you. What else can I do to help prevent falls?  Wear shoes that:  Do not have high heels.  Have rubber bottoms.  Are comfortable and fit you well.  Are closed at the toe. Do not wear sandals.  If you use a stepladder:  Make sure that it is fully opened. Do not climb a closed  stepladder.  Make sure that both sides of the stepladder are locked into place.  Ask someone to hold it for you, if possible.  Clearly mark and make sure that you can see:  Any grab bars or handrails.  First and last steps.  Where the edge of each step is.  Use tools that help you move around (mobility aids) if they are needed. These include:  Canes.  Walkers.  Scooters.  Crutches.  Turn on the lights when you go into a dark area. Replace any light bulbs as soon as they burn out.  Set up your furniture so you have a clear path. Avoid moving your furniture around.  If any of your floors are uneven, fix them.  If there are any pets around you, be aware of where they are.  Review your medicines with your doctor. Some medicines can make you feel dizzy. This can increase your chance of falling. Ask your doctor what other things that you can do to help prevent falls. This information is not intended to replace advice given to you by your health care provider. Make sure you discuss any questions you have with your health care provider. Document Released: 08/03/2009 Document Revised: 03/14/2016 Document Reviewed: 11/11/2014 Elsevier Interactive Patient Education  2017 [employer].

## 2018-01-13 ENCOUNTER — Ambulatory Visit (INDEPENDENT_AMBULATORY_CARE_PROVIDER_SITE_OTHER): Payer: Medicare Other | Admitting: Family Medicine

## 2018-01-13 ENCOUNTER — Encounter: Payer: Self-pay | Admitting: Family Medicine

## 2018-01-13 VITALS — BP 130/70 | HR 72 | Temp 97.8°F | Resp 16 | Ht 63.0 in | Wt 119.0 lb

## 2018-01-13 DIAGNOSIS — E785 Hyperlipidemia, unspecified: Secondary | ICD-10-CM

## 2018-01-13 DIAGNOSIS — R5383 Other fatigue: Secondary | ICD-10-CM

## 2018-01-13 DIAGNOSIS — Z0000 Encounter for general adult medical examination without abnormal findings: Secondary | ICD-10-CM | POA: Diagnosis not present

## 2018-01-13 DIAGNOSIS — M1990 Unspecified osteoarthritis, unspecified site: Secondary | ICD-10-CM | POA: Diagnosis not present

## 2018-01-13 DIAGNOSIS — M7061 Trochanteric bursitis, right hip: Secondary | ICD-10-CM | POA: Diagnosis not present

## 2018-01-13 MED ORDER — CELECOXIB 200 MG PO CAPS: 200.0000 mg | ORAL_CAPSULE | Freq: Every day | ORAL | 1 refills | Status: DC

## 2018-01-13 MED ORDER — TRAMADOL HCL 50 MG PO TABS: 50.0000 mg | ORAL_TABLET | Freq: Two times a day (BID) | ORAL | 5 refills | Status: DC | PRN

## 2018-01-13 NOTE — Progress Notes (Signed)
Patient: Alexis Medina, Female    DOB: 1946-05-15, 72 y.o.   MRN: [id number] Visit Date: 01/13/2018  Today's Provider: [name], MD   Chief Complaint  Patient presents with  . Medicare Wellness  . Hip Pain  . Ear Pain   Subjective:     Complete Physical Alexis Medina is a 72 y.o. female. She feels fairly well. She reports exercising daily (walking). She reports she is sleeping well. Patient reports breast, pelvic and rectal exam are done with Dr. Marcelline Mates GYN 01/09/18 [facility] with [facility] 07/24/17 Mammogram-BI-RADS 1 06/10/13 colonoscopy-polyps-hyperplastic recheck in 10 years   Patient C/O right ear pain x's a couple a weeks. Patient denies any discharge or hearing loss. Patient reports using OTC drops.   Patient C/O right hip pain x's 2 months on and off. Patient reports pain is present with or with out activity.  -----------------------------------------------------------   Review of Systems  Constitutional: Negative.   HENT: Positive for ear pain (right).   Eyes: Negative.   Respiratory: Negative.   Cardiovascular: Negative.   Gastrointestinal: Negative.   Endocrine: Negative.   Genitourinary: Negative.   Musculoskeletal: Positive for arthralgias (right hip) and back pain (chronic).  Skin: Negative.   Neurological: Negative.   Hematological: Negative.   Psychiatric/Behavioral: Negative.     Social History   Socioeconomic History  . Marital status: Married    Spouse name: Not on file  . Number of children: 1  . Years of education: Not on file  . Highest education level: Bachelor's degree (e.g., BA, AB, BS)  Occupational History  . Occupation: part time - Therapist, sports @ [employer]  . Financial resource strain: Not on file  . Food insecurity:    Worry: Never true    Inability: Never true  . Transportation needs:    Medical: No    Non-medical: No  Tobacco Use  . Smoking status: Never Smoker  . Smokeless tobacco: Never Used  Substance  and Sexual Activity  . Alcohol use: No    Frequency: Never    Comment: rare  . Drug use: No  . Sexual activity: Yes    Birth control/protection: None, Surgical  Lifestyle  . Physical activity:    Days per week: Not on file    Minutes per session: Not on file  . Stress: Only a little  Relationships  . Social connections:    Talks on phone: Not on file    Gets together: Not on file    Attends religious service: Not on file    Active member of club or organization: Not on file    Attends meetings of clubs or organizations: Not on file    Relationship status: Not on file  . Intimate partner violence:    Fear of current or ex partner: Not on file    Emotionally abused: Not on file    Physically abused: Not on file    Forced sexual activity: Not on file  Other Topics Concern  . Not on file  Social History Narrative  . Not on file    Past Medical History:  Diagnosis Date  . Hormone replacement therapy   . Hyperlipidemia   . Osteopenia      Patient Active Problem List   Diagnosis Date Noted  . Menopausal syndrome on hormone replacement therapy 04/18/2015  . History of postmenopausal bleeding 04/18/2015    Past Surgical History:  Procedure Laterality Date  . TONSILLECTOMY    .  TUBAL LIGATION      Her family history includes Alzheimer's disease in her mother; Breast cancer in her cousin; Healthy in her son; Heart disease in her brother, father, and mother; Hypertension in her brother.      Current Outpatient Medications:  .  alendronate (FOSAMAX) 70 MG tablet, Take 1 tablet (70 mg total) by mouth every 7 (seven) days. Take with a full glass of water on an empty stomach., Disp: 12 tablet, Rfl: 3 .  aspirin EC 81 MG tablet, Take 81 mg by mouth every other day. , Disp: , Rfl:  .  cholecalciferol (VITAMIN D) 1000 UNITS tablet, Take 1,000 Units by mouth daily., Disp: , Rfl:  .  ibuprofen (ADVIL,MOTRIN) 800 MG tablet, Take 1 tablet (800 mg total) by mouth every 8 (eight)  hours as needed., Disp: 90 tablet, Rfl: 3 .  Multiple Vitamin (MULTIVITAMIN) capsule, Take 1 capsule by mouth daily., Disp: , Rfl:  .  PREMPRO 0.3-1.5 MG tablet, TAKE 1 TABLET DAILY, Disp: 84 tablet, Rfl: 4 .  ranitidine (ZANTAC) 150 MG capsule, TAKE 1 CAPSULE TWICE A DAY, Disp: 180 capsule, Rfl: 3 .  sertraline (ZOLOFT) 50 MG tablet, TAKE 1/2 TABLET DAILY AS DIRECTED, Disp: , Rfl:  .  tazarotene (AVAGE) 0.1 % cream, , Disp: , Rfl:  .  traMADol (ULTRAM) 50 MG tablet, Take 1 tablet (50 mg total) by mouth 2 (two) times daily as needed., Disp: 135 tablet, Rfl: 5 .  VAYACOG 100-19.5-6.5 MG CAPS, TAKE ONE CAPSULE BY MOUTH ONCE DAILY, Disp: 30 capsule, Rfl: 12 .  celecoxib (CELEBREX) 200 MG capsule, Take 1 capsule (200 mg total) by mouth daily., Disp: 30 capsule, Rfl: 1  Patient Care Team: Jerrol Banana., MD as PCP - General (Family Medicine) Shon Hough, MD as Consulting Physician (Ophthalmology) Rubie Maid, MD as Referring Physician (Obstetrics and Gynecology)     Objective:   Vitals: BP 130/70 (BP Location: Left Arm, Patient Position: Sitting, Cuff Size: Normal)   Pulse 72   Temp 97.8 F (36.6 C) (Oral)   Resp 16   Ht 5\' 3"  (1.6 m)   Wt 119 lb (54 kg)   SpO2 100%   BMI 21.08 kg/m   Physical Exam  Constitutional: She is oriented to person, place, and time. She appears well-developed and well-nourished.  HENT:  Head: Normocephalic and atraumatic.  Right Ear: External ear normal.  Left Ear: External ear normal.  Nose: Nose normal.  Mouth/Throat: Oropharynx is clear and moist.  Eyes: Pupils are equal, round, and reactive to light. Conjunctivae and EOM are normal.  Neck: Normal range of motion. Neck supple.  Cardiovascular: Normal rate, regular rhythm and normal heart sounds.  Pulmonary/Chest: Effort normal and breath sounds normal.  Abdominal: Soft. Bowel sounds are normal.  Neurological: She is alert and oriented to person, place, and time.  Skin: Skin is warm  and dry.  Psychiatric: She has a normal mood and affect. Her behavior is normal. Judgment and thought content normal.    Activities of Daily Living In your present state of health, do you have any difficulty performing the following activities: 01/09/2018  Hearing? N  Vision? N  Difficulty concentrating or making decisions? N  Walking or climbing stairs? N  Dressing or bathing? N  Doing errands, shopping? N  Preparing Food and eating ? N  Using the Toilet? N  In the past six months, have you accidently leaked urine? N  Do you have problems with loss of bowel control?  N  Managing your Medications? N  Managing your Finances? N  Housekeeping or managing your Housekeeping? N  Some recent data might be hidden    Fall Risk Assessment Fall Risk  01/09/2018 01/02/2017 07/30/2016 06/18/2016  Falls in the past year? No No No No  Comment - - - Emmi Telephone Survey: data to providers prior to load     Depression Screen PHQ 2/9 Scores 01/09/2018 01/02/2017 07/30/2016  PHQ - 2 Score 0 0 0    Cognitive Testing - 6-CIT  Correct? Score   What year is it? yes 0 0 or 4  What month is it? yes 0 0 or 3  Memorize:    Pia Mau,  42,  [address],  [place],      What time is it? (within 1 hour) yes 0 0 or 3  Count backwards from 20 yes 0 0, 2, or 4  Name the months of the year yes 0 0, 2, or 4  Repeat name & address above yes 0 0, 2, 4, 6, 8, or 10       TOTAL SCORE  0/28   Interpretation:  Normal  Normal (0-7) Abnormal (8-28)     Assessment & Plan:   Right Trochanteric Bursitis Celebrex daily for 1-2 weeks. Chronic Back Pain/DDD Eustachian Tube dysfunction May need ENT referral  Osteoporosis Postmenopausal HRT Goals    . DIET - INCREASE WATER INTAKE     Recommend increasing water intake to 4-6 glasses a day.        Immunization History  Administered Date(s) Administered  . Hepatitis A 04/09/2006  . Influenza, High Dose Seasonal PF 08/19/2015, 07/30/2016, 07/17/2017  .  Pneumococcal Conjugate-13 01/02/2017  . Pneumococcal Polysaccharide-23 08/10/2013  . Yellow Fever 04/09/2006    Health Maintenance  Topic Date Due  . TETANUS/TDAP  05/29/1965  . MAMMOGRAM  07/25/2019  . COLONOSCOPY  06/11/2023  . INFLUENZA VACCINE  Completed  . DEXA SCAN  Completed  . Hepatitis C Screening  Completed  . PNA vac Low Risk Adult  Completed     Discussed health benefits of physical activity, and encouraged her to engage in regular exercise appropriate for her age and condition.    ------------------------------------------------------------------------------------------------------------    [name], MD  [place]

## 2018-03-05 ENCOUNTER — Telehealth: Payer: Self-pay | Admitting: Family Medicine

## 2018-03-05 NOTE — Telephone Encounter (Signed)
Pt called she has been taking Vayacog for memory.  She has been getting it at [facility] but she said they told her it was no longer on the market.  She wants to know what Dr. Rosanna Randy suggest she takes.  Pt's call back is [phone]  Thanks teri

## 2018-03-05 NOTE — Telephone Encounter (Signed)
Please advise. Thanks.  

## 2018-03-05 NOTE — Telephone Encounter (Signed)
Fish oil--mainly EPA daily.

## 2018-03-05 NOTE — Telephone Encounter (Signed)
Advised  ED 

## 2018-03-26 ENCOUNTER — Other Ambulatory Visit: Payer: Self-pay | Admitting: Family Medicine

## 2018-06-02 ENCOUNTER — Encounter: Payer: Self-pay | Admitting: Family Medicine

## 2018-06-02 ENCOUNTER — Ambulatory Visit (INDEPENDENT_AMBULATORY_CARE_PROVIDER_SITE_OTHER): Payer: Medicare Other | Admitting: Family Medicine

## 2018-06-02 ENCOUNTER — Other Ambulatory Visit: Payer: Self-pay

## 2018-06-02 VITALS — BP 122/60 | HR 72 | Temp 97.5°F | Resp 16 | Wt 120.0 lb

## 2018-06-02 DIAGNOSIS — F411 Generalized anxiety disorder: Secondary | ICD-10-CM

## 2018-06-02 DIAGNOSIS — M7061 Trochanteric bursitis, right hip: Secondary | ICD-10-CM

## 2018-06-02 DIAGNOSIS — M51379 Other intervertebral disc degeneration, lumbosacral region without mention of lumbar back pain or lower extremity pain: Secondary | ICD-10-CM | POA: Insufficient documentation

## 2018-06-02 DIAGNOSIS — M5137 Other intervertebral disc degeneration, lumbosacral region: Secondary | ICD-10-CM

## 2018-06-02 MED ORDER — CELECOXIB 100 MG PO CAPS
100.0000 mg | ORAL_CAPSULE | Freq: Every day | ORAL | 5 refills | Status: DC
Start: 2018-06-02 — End: 2018-06-03

## 2018-06-02 MED ORDER — TRAMADOL HCL 50 MG PO TABS: 50.0000 mg | ORAL_TABLET | Freq: Two times a day (BID) | ORAL | 5 refills | Status: DC | PRN

## 2018-06-02 NOTE — Progress Notes (Signed)
Patient: Alexis Medina Female    DOB: 25-Jan-1946   72 y.o.   MRN: [id number] Visit Date: 06/02/2018  Today's Provider: [name], MD   Chief Complaint  Patient presents with  . Depression  . Arthritis  . Gastroesophageal Reflux   Subjective:    HPI Patient comes in today for a follow up on chronic issues.   Patient was last seen in the office 5 months ago. No changes were made in her medications.   Patient reports that she no longer takes Celebrex, but would like to start back due to it helping with her bursitis. She is also needing a refill on Tramadol.  She also mentions that she has taken Sertraline for "a long time", and feels that it may not be needed anymore. She reports that she is feeling well emotionally. Seems to have been on this for anxiety but feels ok after 2 weeks of no meds.     No Known Allergies   Current Outpatient Medications:  .  alendronate (FOSAMAX) 70 MG tablet, TAKE 1 TABLET EVERY 7 DAYS WITH A FULL GLASS OF WATER ON AN EMPTY STOMACH, Disp: 12 tablet, Rfl: 3 .  aspirin EC 81 MG tablet, Take 81 mg by mouth every other day. , Disp: , Rfl:  .  celecoxib (CELEBREX) 200 MG capsule, Take 1 capsule (200 mg total) by mouth daily., Disp: 30 capsule, Rfl: 1 .  cholecalciferol (VITAMIN D) 1000 UNITS tablet, Take 1,000 Units by mouth daily., Disp: , Rfl:  .  ibuprofen (ADVIL,MOTRIN) 800 MG tablet, Take 1 tablet (800 mg total) by mouth every 8 (eight) hours as needed., Disp: 90 tablet, Rfl: 3 .  Multiple Vitamin (MULTIVITAMIN) capsule, Take 1 capsule by mouth daily., Disp: , Rfl:  .  PREMPRO 0.3-1.5 MG tablet, TAKE 1 TABLET DAILY, Disp: 84 tablet, Rfl: 4 .  ranitidine (ZANTAC) 150 MG capsule, TAKE 1 CAPSULE TWICE A DAY, Disp: 180 capsule, Rfl: 3 .  sertraline (ZOLOFT) 50 MG tablet, TAKE 1/2 TABLET DAILY AS DIRECTED, Disp: , Rfl:  .  tazarotene (AVAGE) 0.1 % cream, , Disp: , Rfl:  .  traMADol (ULTRAM) 50 MG tablet, Take 1 tablet (50 mg total) by  mouth 2 (two) times daily as needed., Disp: 135 tablet, Rfl: 5 .  VAYACOG 100-19.5-6.5 MG CAPS, TAKE ONE CAPSULE BY MOUTH ONCE DAILY, Disp: 30 capsule, Rfl: 12  Review of Systems  Constitutional: Negative for activity change, appetite change, chills, diaphoresis, fatigue, fever and unexpected weight change.  HENT: Negative.   Eyes: Negative.   Respiratory: Negative for cough and shortness of breath.   Cardiovascular: Negative for chest pain, palpitations and leg swelling.  Endocrine: Negative.   Musculoskeletal: Positive for arthralgias and myalgias.  Skin: Negative.   Allergic/Immunologic: Negative.   Neurological: Negative for dizziness, light-headedness and headaches.  Psychiatric/Behavioral: Negative for agitation, behavioral problems, decreased concentration, self-injury, sleep disturbance and suicidal ideas. The patient is not hyperactive.     Social History   Tobacco Use  . Smoking status: Never Smoker  . Smokeless tobacco: Never Used  Substance Use Topics  . Alcohol use: No    Frequency: Never    Comment: rare   Objective:   BP 122/60 (BP Location: Right Arm, Patient Position: Sitting, Cuff Size: Normal)   Pulse 72   Temp (!) 97.5 F (36.4 C)   Resp 16   Wt 120 lb (54.4 kg)   SpO2 97%   BMI 21.26 kg/m  Vitals:   06/02/18 0813  BP: 122/60  Pulse: 72  Resp: 16  Temp: (!) 97.5 F (36.4 C)  SpO2: 97%  Weight: 120 lb (54.4 kg)     Physical Exam  Constitutional: She is oriented to person, place, and time. She appears well-developed and well-nourished.  HENT:  Head: Normocephalic and atraumatic.  Eyes: Conjunctivae are normal. No scleral icterus.  Neck: No thyromegaly present.  Cardiovascular: Normal rate, regular rhythm and normal heart sounds.  Pulmonary/Chest: Effort normal and breath sounds normal.  Abdominal: Soft.  Musculoskeletal: She exhibits no edema.  Neurological: She is alert and oriented to person, place, and time.  Skin: Skin is warm and dry.   Psychiatric: She has a normal mood and affect. Her behavior is normal. Judgment and thought content normal.        Assessment & Plan:     1. Trochanteric bursitis of right hip Take Celebrex less than half of days. Dose cut in half in small lady at 72yo. - celecoxib (CELEBREX) 100 MG capsule; Take 1 capsule (100 mg total) by mouth daily.  Dispense: 30 capsule; Refill: 5 2.DDD 3.GAD RTC 2 months.     I have done the exam and reviewed the above chart and it is accurate to the best of my knowledge. Development worker, community has been used in this note in any air is in the dictation or transcription are unintentional.  [name], MD  [place]

## 2018-06-02 NOTE — Telephone Encounter (Signed)
Patient was seen this morning. The refills for Tramadol and Celebrex were sent to [facility]. She is requesting the medications be sent to [facility] instead.

## 2018-06-03 NOTE — Telephone Encounter (Signed)
Please sign off on medications. Thanks!

## 2018-06-04 MED ORDER — CELECOXIB 100 MG PO CAPS: 100.0000 mg | ORAL_CAPSULE | Freq: Every day | ORAL | 3 refills | Status: DC

## 2018-06-04 MED ORDER — TRAMADOL HCL 50 MG PO TABS: 50.0000 mg | ORAL_TABLET | Freq: Two times a day (BID) | ORAL | 5 refills | Status: DC | PRN

## 2018-06-24 ENCOUNTER — Telehealth: Payer: Self-pay | Admitting: Family Medicine

## 2018-06-24 NOTE — Telephone Encounter (Signed)
Please review. Thanks!  

## 2018-06-24 NOTE — Telephone Encounter (Signed)
Pt's husband Delfino Lovett stated pt has been taking Vayacog for several years because dementia runs in the family but [facility] Nan is no longer made and Dr. Rosanna Randy recommended they look at supplements via [email or web address]. Richard stated they found about 5 or 6 on the site but wanted to know which one Dr. Rosanna Randy would recommend. Please advise. Thanks TNP

## 2018-06-26 NOTE — Telephone Encounter (Signed)
DHA/Phosphytidylserine combination. Could be 2 pills separately My spelling might be a little off for second ingredient.

## 2018-06-29 ENCOUNTER — Other Ambulatory Visit: Payer: Self-pay | Admitting: Family Medicine

## 2018-06-29 ENCOUNTER — Other Ambulatory Visit: Payer: Self-pay

## 2018-06-29 MED ORDER — DHA-PHOSPHATIDYLSERINE 150-50 MG PO CAPS: 2.0000 | ORAL_CAPSULE | Freq: Every day | ORAL | 12 refills | Status: DC

## 2018-06-29 NOTE — Telephone Encounter (Signed)
Sent to pharmacy and message left for patient

## 2018-07-01 ENCOUNTER — Other Ambulatory Visit: Payer: Self-pay

## 2018-07-01 ENCOUNTER — Telehealth: Payer: Self-pay | Admitting: Family Medicine

## 2018-07-01 MED ORDER — DHA OMEGA 3 100 MG PO CAPS: ORAL_CAPSULE | ORAL | 12 refills | Status: AC

## 2018-07-01 NOTE — Telephone Encounter (Signed)
Patient's husband came by stating the [facility] and it shouldve been sent to [facility] on [address].   Please do ASAP.

## 2018-07-02 ENCOUNTER — Other Ambulatory Visit: Payer: Self-pay | Admitting: Family Medicine

## 2018-07-02 DIAGNOSIS — K219 Gastro-esophageal reflux disease without esophagitis: Secondary | ICD-10-CM

## 2018-07-02 MED ORDER — RANITIDINE HCL 150 MG PO CAPS: 150.0000 mg | ORAL_CAPSULE | Freq: Two times a day (BID) | ORAL | 3 refills | Status: DC

## 2018-07-02 NOTE — Telephone Encounter (Signed)
[facility] faxed a refill request for the following medication. Thanks CC   ranitidine (ZANTAC) 150 MG capsule

## 2018-07-02 NOTE — Telephone Encounter (Signed)
Rx sent to pharmacy   

## 2018-07-16 ENCOUNTER — Ambulatory Visit: Payer: Self-pay | Admitting: Family Medicine

## 2018-07-21 ENCOUNTER — Ambulatory Visit (INDEPENDENT_AMBULATORY_CARE_PROVIDER_SITE_OTHER): Payer: Medicare Other | Admitting: Family Medicine

## 2018-07-21 ENCOUNTER — Encounter: Payer: Self-pay | Admitting: Family Medicine

## 2018-07-21 VITALS — BP 104/62 | HR 78 | Temp 97.9°F | Resp 16 | Ht 63.0 in | Wt 118.0 lb

## 2018-07-21 DIAGNOSIS — M5137 Other intervertebral disc degeneration, lumbosacral region: Secondary | ICD-10-CM

## 2018-07-21 DIAGNOSIS — K219 Gastro-esophageal reflux disease without esophagitis: Secondary | ICD-10-CM

## 2018-07-21 DIAGNOSIS — M5440 Lumbago with sciatica, unspecified side: Secondary | ICD-10-CM | POA: Diagnosis not present

## 2018-07-21 DIAGNOSIS — F411 Generalized anxiety disorder: Secondary | ICD-10-CM | POA: Diagnosis not present

## 2018-07-21 MED ORDER — FAMOTIDINE 40 MG PO TABS: 40.0000 mg | ORAL_TABLET | Freq: Two times a day (BID) | ORAL | 5 refills | Status: DC

## 2018-07-21 NOTE — Progress Notes (Signed)
Patient: Alexis Medina Female    DOB: 1946-08-08   72 y.o.   MRN: [id number] Visit Date: 07/21/2018  Today's Provider: [name], MD   Chief Complaint  Patient presents with  . Back Pain  . Depression   Subjective:    HPI Patient comes in today for a follow up. She was last seen in the office 1 month ago.   She was treated for bursitis in her right hip with celebrex 100mg  daily. Patient reports that this has helped with the pain. However, she does still have pain that comes and go. She describes it as a "catch" in her lower back that only lasts a few seconds.    Patient also reports that she is no longer on Vayacog due to it coming off the market. She is wanting to try another supplement to help with her memory.     No Known Allergies   Current Outpatient Medications:  .  alendronate (FOSAMAX) 70 MG tablet, TAKE 1 TABLET EVERY 7 DAYS WITH A FULL GLASS OF WATER ON AN EMPTY STOMACH, Disp: 12 tablet, Rfl: 3 .  aspirin EC 81 MG tablet, Take 81 mg by mouth every other day. , Disp: , Rfl:  .  celecoxib (CELEBREX) 100 MG capsule, Take 1 capsule (100 mg total) by mouth daily., Disp: 90 capsule, Rfl: 3 .  cholecalciferol (VITAMIN D) 1000 UNITS tablet, Take 1,000 Units by mouth daily., Disp: , Rfl:  .  Docosahexaenoic Acid (DHA OMEGA 3) 100 MG CAPS, TAKE 2 EACH BY MOUTH DAILY., Disp: 60 capsule, Rfl: 12 .  ibuprofen (ADVIL,MOTRIN) 800 MG tablet, Take 1 tablet (800 mg total) by mouth every 8 (eight) hours as needed., Disp: 90 tablet, Rfl: 3 .  Multiple Vitamin (MULTIVITAMIN) capsule, Take 1 capsule by mouth daily., Disp: , Rfl:  .  PREMPRO 0.3-1.5 MG tablet, TAKE 1 TABLET DAILY, Disp: 84 tablet, Rfl: 4 .  tazarotene (AVAGE) 0.1 % cream, , Disp: , Rfl:  .  traMADol (ULTRAM) 50 MG tablet, Take 1 tablet (50 mg total) by mouth 2 (two) times daily as needed., Disp: 135 tablet, Rfl: 5 .  ranitidine (ZANTAC) 150 MG capsule, Take 1 capsule (150 mg total) by mouth 2 (two) times  daily. (Patient not taking: Reported on 07/21/2018), Disp: 180 capsule, Rfl: 3 .  VAYACOG 100-19.5-6.5 MG CAPS, TAKE ONE CAPSULE BY MOUTH ONCE DAILY (Patient not taking: Reported on 07/21/2018), Disp: 30 capsule, Rfl: 12  Review of Systems  Constitutional: Negative for activity change, appetite change, chills and unexpected weight change.  Respiratory: Negative for cough and shortness of breath.   Cardiovascular: Negative for chest pain, palpitations and leg swelling.  Gastrointestinal: Negative for abdominal distention, constipation, diarrhea and nausea.  Musculoskeletal: Positive for arthralgias and back pain.  Allergic/Immunologic: Negative for environmental allergies.  Psychiatric/Behavioral: Negative for agitation, confusion, decreased concentration, self-injury, sleep disturbance and suicidal ideas. The patient is not nervous/anxious and is not hyperactive.     Social History   Tobacco Use  . Smoking status: Never Smoker  . Smokeless tobacco: Never Used  Substance Use Topics  . Alcohol use: No    Frequency: Never    Comment: rare   Objective:   BP 104/62 (BP Location: Left Arm, Patient Position: Sitting, Cuff Size: Normal)   Pulse 78   Temp 97.9 F (36.6 C)   Resp 16   Ht 5\' 3"  (1.6 m)   Wt 118 lb (53.5 kg)   SpO2  100%   BMI 20.90 kg/m  Vitals:   07/21/18 0947  BP: 104/62  Pulse: 78  Resp: 16  Temp: 97.9 F (36.6 C)  SpO2: 100%  Weight: 118 lb (53.5 kg)  Height: 5\' 3"  (1.6 m)     Physical Exam  Constitutional: She is oriented to person, place, and time. She appears well-developed and well-nourished.  HENT:  Head: Normocephalic and atraumatic.  Eyes: Conjunctivae are normal. No scleral icterus.  Neck: No thyromegaly present.  Cardiovascular: Normal rate, regular rhythm, normal heart sounds and intact distal pulses.  Pulmonary/Chest: Effort normal and breath sounds normal.  Musculoskeletal: Normal range of motion.       Lumbar back: She exhibits tenderness.  She exhibits no swelling, no edema and no spasm.  FROM in lower back. Patient experiences slight tenderness when palpitating. Pain does not radiate.   Neurological: She is alert and oriented to person, place, and time.  Skin: Skin is warm and dry.  Psychiatric: She has a normal mood and affect. Her behavior is normal. Judgment and thought content normal.        Assessment & Plan:     1. DDD (degenerative disc disease), lumbosacral  2. GAD (generalized anxiety disorder) Stable off of Zoloft. F/U in 3-4 months.   3. Low back pain with sciatica, sciatica laterality unspecified, unspecified back pain laterality, unspecified chronicity - Ambulatory referral to Physical Therapy  4. Gastroesophageal reflux disease without esophagitis Discontinue Zantac start famotidine as below.  - famotidine (PEPCID) 40 MG tablet; Take 1 tablet (40 mg total) by mouth 2 (two) times daily.  Dispense: 60 tablet; Refill: 5 5.[facility] Generic Vayacog.      I have done the exam and reviewed the above chart and it is accurate to the best of my knowledge. Development worker, community has been used in this note in any air is in the dictation or transcription are unintentional.  [name], MD  [place]

## 2018-07-30 DIAGNOSIS — L814 Other melanin hyperpigmentation: Secondary | ICD-10-CM | POA: Diagnosis not present

## 2018-07-30 DIAGNOSIS — R21 Rash and other nonspecific skin eruption: Secondary | ICD-10-CM | POA: Diagnosis not present

## 2018-07-30 DIAGNOSIS — L570 Actinic keratosis: Secondary | ICD-10-CM | POA: Diagnosis not present

## 2018-07-30 DIAGNOSIS — L821 Other seborrheic keratosis: Secondary | ICD-10-CM | POA: Diagnosis not present

## 2018-07-30 DIAGNOSIS — L820 Inflamed seborrheic keratosis: Secondary | ICD-10-CM | POA: Diagnosis not present

## 2018-07-30 DIAGNOSIS — L988 Other specified disorders of the skin and subcutaneous tissue: Secondary | ICD-10-CM | POA: Diagnosis not present

## 2018-08-25 ENCOUNTER — Other Ambulatory Visit: Payer: Self-pay

## 2018-08-25 ENCOUNTER — Ambulatory Visit: Payer: Medicare Other | Attending: Family Medicine | Admitting: Physical Therapy

## 2018-08-25 ENCOUNTER — Encounter: Payer: Self-pay | Admitting: Physical Therapy

## 2018-08-25 DIAGNOSIS — M5440 Lumbago with sciatica, unspecified side: Secondary | ICD-10-CM | POA: Diagnosis not present

## 2018-08-25 DIAGNOSIS — M545 Low back pain, unspecified: Secondary | ICD-10-CM

## 2018-08-25 DIAGNOSIS — R262 Difficulty in walking, not elsewhere classified: Secondary | ICD-10-CM | POA: Diagnosis not present

## 2018-08-25 DIAGNOSIS — G8929 Other chronic pain: Secondary | ICD-10-CM | POA: Diagnosis not present

## 2018-08-25 DIAGNOSIS — M6281 Muscle weakness (generalized): Secondary | ICD-10-CM | POA: Diagnosis not present

## 2018-08-25 NOTE — Therapy (Signed)
[place] MAIN [facility] SERVICES [address] [place], [place], [address] Phone: [phone]   Fax:  [phone]  Physical Therapy Evaluation  Patient Details  Name: Alexis Medina MRN: [id number] Date of Birth: May 16, 1946 Referring Provider (PT): Jerrol Banana   Encounter Date: 08/25/2018    Past Medical History:  Diagnosis Date  . Hormone replacement therapy   . Hyperlipidemia   . Osteopenia     Past Surgical History:  Procedure Laterality Date  . TONSILLECTOMY    . TUBAL LIGATION      There were no vitals filed for this visit.   Subjective Assessment - 08/25/18 1737    Subjective  Patient reports that her back has been getting more painful and now it is 6/10. she has episodes where she feels like her legs are going to give away after she is standing up  for a few minutes and then turns to move.     Pertinent History  Pateint reports chronic back pain that has episodes of when it flairs up. She has been doing her HEP from when she came to therapy 2 years ago. She works 3 days / week and has low back pain that gets worse in the AM and better as the day goes on.,     Limitations  Standing;Walking;House hold activities    How long can you sit comfortably?  1 hour    How long can you stand comfortably?  10 mins    How long can you walk comfortably?  30 mins    Patient Stated Goals  to be able to move without her back hurting    Currently in Pain?  Yes    Pain Score  6     Pain Location  Back    Pain Orientation  Lower    Pain Descriptors / Indicators  Spasm    Pain Type  Chronic pain    Pain Onset  More than a month ago    Pain Frequency  Intermittent    Aggravating Factors   standing     Pain Relieving Factors  moving    Effect of Pain on Daily Activities  difficult to perform activities    Multiple Pain Sites  No         [facility] PT Assessment - 08/25/18 1632      Assessment   Medical Diagnosis  low back pain     Referring Provider (PT)  Cranford Mon, RICHARD L    Onset Date/Surgical Date  07/21/18    Hand Dominance  Right    Prior Therapy  --   no     Precautions   Precautions  None      Restrictions   Weight Bearing Restrictions  No      Balance Screen   Has the patient fallen in the past 6 months  --   no   Has the patient had a decrease in activity level because of a fear of falling?   No    Is the patient reluctant to leave their home because of a fear of falling?   No      Home Social worker  Private residence    Living Arrangements  Spouse/significant other    Available Help at Discharge  Family    Type of [place] to enter    Entrance Stairs-Number of Steps  6    Entrance  Stairs-Rails  Right    Home Layout  Two level    Alternate Level Stairs-Number of Steps  13    Alternate Level Stairs-Rails  Right    Home Equipment  None      Prior Function   Level of Independence  Independent with basic ADLs    Vocation  Retired    Leisure  makes greeting cards, outside work      Cognition   Overall Cognitive Status  Within Functional Limits for tasks assessed          PAIN: 0/10-6/10 low back , + spring test , tender to palpation L1 - L5   POSTURE:WNL    PROM/AROM: limited trunk extension due to pain  STRENGTH:  Graded on a 0-5 scale Muscle Group Left Right                          Hip Flex 3+/5 3+/5  Hip Abd 3+/5 3+/5  Hip Add 2/5 2/5  Hip Ext 2/5 2/5  Hip IR/ER 3+/5 3+/5  Knee Flex 4/5 4/5  Knee Ext 4/5 4/5  Ankle DF 4/5 4/5  Ankle PF 4/5 4/5   SENSATION: WNL BUE and BLE   SPECIAL TESTS:+ FABER LLE, + prone knee flex BLE, + spring test   FUNCTIONAL MOBILITY: guarded prone to supine and guarded supine to sit and sit to supine     GAIT:  Patient has decreased gait speed 1. 0 m/sec no antalgic gait , no AD  OUTCOME MEASURES: TEST Outcome Interpretation      OSW 44% Moderate disability                             Objective measurements completed on examination: See above findings.              PT Education - 08/25/18 1741    Education provided  Yes    Education Details  plan of care    Person(s) Educated  Patient    Methods  Explanation    Comprehension  Verbalized understanding       PT Short Term Goals - 08/25/18 1750      PT SHORT TERM GOAL #1   Title  Patient will be independent in HEP for prone extension exercises.     Time  4    Period  Weeks    Status  New    Target Date  09/22/18        PT Long Term Goals - 08/25/18 1749      PT LONG TERM GOAL #1   Title  Patient will be independent in home exercise program to improve strength/mobility for better functional independence with ADLs.    Time  8    Period  Weeks    Status  New    Target Date  10/20/18      PT LONG TERM GOAL #2   Title   Patient will be able to perform household work/ chores without increase in symptoms.    Time  8    Period  Weeks    Status  New    Target Date  10/20/18      PT LONG TERM GOAL #3   Title  Patient will report a worst pain of 3/10 on VAS in low back to improve tolerance with ADLs and reduced symptoms with activities.     Time  8  Period  Weeks    Status  New    Target Date  10/20/18      PT LONG TERM GOAL #4   Title  Patient will increase lumbar extension strength to at least 4/5 as to improve gross strength for sitting/standing tolerance with better erect posture for increased tolerance with ADLs    Time  8    Period  Weeks    Status  New    Target Date  10/20/18             Plan - 08/25/18 1743    Clinical Impression Statement  Patient is 72 year old female who presents with LBP that is worse in the AM and gets better as the day continues. She has tenderness to palpation L 1- L5.  She has + prone flex test bilaterally, + FABER LLE, + spring test for L- L5. She has weakness in BLE and core, dofficulty with standing due to pain in low back. She  has OSW 44 %. indicating moderate disability. She is instructed in prone press ups for HEP x 10 every hour and to temporally stop her other exercises. She has no numbness or tingling in her LE's.     Rehab Potential  Good    PT Frequency  2x / week    PT Duration  12 weeks    PT Treatment/Interventions  Cryotherapy;Aquatic Therapy;Electrical Stimulation;Moist Heat;Traction;Ultrasound;Therapeutic activities;Gait training;Therapeutic exercise;Balance training;Manual techniques;Passive range of motion;Dry needling;Patient/family education    PT Next Visit Plan  manual therapy, core strengthening , body mechanics    PT Home Exercise Plan  prone extenison    Consulted and Agree with Plan of Care  Patient       Patient will benefit from skilled therapeutic intervention in order to improve the following deficits and impairments:  Difficulty walking, Decreased mobility, Impaired sensation, Decreased range of motion, Pain, Impaired flexibility, Decreased strength, Decreased activity tolerance, Abnormal gait  Visit Diagnosis: Chronic low back pain without sciatica, unspecified back pain laterality  Muscle weakness (generalized)  Difficulty in walking, not elsewhere classified     Problem List Patient Active Problem List   Diagnosis Date Noted  . DDD (degenerative disc disease), 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/02/2018  . Menopausal syndrome on hormone replacement therapy 04/18/2015  . History of postmenopausal bleeding 04/18/2015    Arelia Sneddon S,PT DPT 08/25/2018, 5:55 PM  [place] MAIN [facility] SERVICES [address] [place], [place], [address] Phone: [phone]   Fax:  [phone]  Name: Alexis Medina MRN: [id number] Date of Birth: 1946/01/20

## 2018-08-28 ENCOUNTER — Other Ambulatory Visit: Payer: Self-pay | Admitting: Obstetrics and Gynecology

## 2018-08-28 DIAGNOSIS — Z79890 Hormone replacement therapy (postmenopausal): Principal | ICD-10-CM

## 2018-08-28 DIAGNOSIS — N951 Menopausal and female climacteric states: Secondary | ICD-10-CM

## 2018-09-01 ENCOUNTER — Ambulatory Visit: Payer: Medicare Other | Admitting: Physical Therapy

## 2018-09-01 ENCOUNTER — Encounter: Payer: Self-pay | Admitting: Physical Therapy

## 2018-09-01 DIAGNOSIS — M545 Low back pain, unspecified: Secondary | ICD-10-CM

## 2018-09-01 DIAGNOSIS — M6281 Muscle weakness (generalized): Secondary | ICD-10-CM

## 2018-09-01 DIAGNOSIS — R262 Difficulty in walking, not elsewhere classified: Secondary | ICD-10-CM

## 2018-09-01 DIAGNOSIS — G8929 Other chronic pain: Secondary | ICD-10-CM | POA: Diagnosis not present

## 2018-09-01 DIAGNOSIS — M5440 Lumbago with sciatica, unspecified side: Secondary | ICD-10-CM | POA: Diagnosis not present

## 2018-09-01 NOTE — Therapy (Signed)
[place] MAIN [facility] SERVICES [address] [place], [place], [address] Phone: [phone]   Fax:  [phone]  Physical Therapy Treatment  Patient Details  Name: Alexis Medina MRN: [id number] Date of Birth: 1946/04/21 Referring Provider (PT): Jerrol Banana   Encounter Date: 09/01/2018  PT End of Session - 09/01/18 1640    Visit Number  2    Number of Visits  17    Date for PT Re-Evaluation  10/01/16    Authorization Type  --    PT Start Time  0345    PT Stop Time  0430    PT Time Calculation (min)  45 min    Activity Tolerance  Patient tolerated treatment well;Patient limited by pain    Behavior During Therapy  [facility] for tasks assessed/performed       Past Medical History:  Diagnosis Date  . Hormone replacement therapy   . Hyperlipidemia   . Osteopenia     Past Surgical History:  Procedure Laterality Date  . TONSILLECTOMY    . TUBAL LIGATION      There were no vitals filed for this visit.  Subjective Assessment - 09/01/18 1554    Subjective  Patient reports that her back has been getting more painful and now it is 6/10. she has episodes where she feels like her legs are going to give away after she is standing up  for a few minutes and then turns to move.     Pertinent History  Pateint reports chronic back pain that has episodes of when it flairs up. She has been doing her HEP from when she came to therapy 2 years ago. She works 3 days / week and has low back pain that gets worse in the AM and better as the day goes on.,     Limitations  Standing;Walking;House hold activities    How long can you sit comfortably?  1 hour    How long can you stand comfortably?  10 mins    How long can you walk comfortably?  30 mins    Patient Stated Goals  to be able to move without her back hurting    Currently in Pain?  Yes    Pain Score  5     Pain Location  Back    Pain Orientation  Lower    Pain Descriptors / Indicators  Aching     Pain Onset  More than a month ago       Treatment: Prone extension x 10 x 2 sets  Prone extension with overpressure x 10 x 2 L1- L5  Reviewed HEP Reviewed body mechanics   Manual therapy: PA glides grade 2 and 3 L1- L5 x 30 bouts x 3   Patient has no pain following therapy                        PT Education - 09/01/18 1637    Education provided  Yes    Education Details  HEP    Person(s) Educated  Patient    Methods  Explanation    Comprehension  Verbalized understanding;Returned demonstration       PT Short Term Goals - 08/25/18 1750      PT SHORT TERM GOAL #1   Title  Patient will be independent in HEP for prone extension exercises.     Time  4    Period  Weeks    Status  New    Target Date  09/22/18        PT Long Term Goals - 08/25/18 1749      PT LONG TERM GOAL #1   Title  Patient will be independent in home exercise program to improve strength/mobility for better functional independence with ADLs.    Time  8    Period  Weeks    Status  New    Target Date  10/20/18      PT LONG TERM GOAL #2   Title   Patient will be able to perform household work/ chores without increase in symptoms.    Time  8    Period  Weeks    Status  New    Target Date  10/20/18      PT LONG TERM GOAL #3   Title  Patient will report a worst pain of 3/10 on VAS in low back to improve tolerance with ADLs and reduced symptoms with activities.     Time  8    Period  Weeks    Status  New    Target Date  10/20/18      PT LONG TERM GOAL #4   Title  Patient will increase lumbar extension strength to at least 4/5 as to improve gross strength for sitting/standing tolerance with better erect posture for increased tolerance with ADLs    Time  8    Period  Weeks    Status  New    Target Date  10/20/18            Plan - 09/01/18 1645    Clinical Impression Statement  Patient presents with 6/10 pain to low back and performs prone extension x 10 x 2 sets  and then is followed by over pressure and prone extension with pain relief to 0/10 .She responds to maual therapy with PA glides to  L1-L5 grade 2 and 3. She is educated about posture and HEP. She will continue to benefit from skilled PT to improve pain and mobility.    Rehab Potential  Good    PT Frequency  2x / week    PT Duration  12 weeks    PT Treatment/Interventions  Cryotherapy;Aquatic Therapy;Electrical Stimulation;Moist Heat;Traction;Ultrasound;Therapeutic activities;Gait training;Therapeutic exercise;Balance training;Manual techniques;Passive range of motion;Dry needling;Patient/family education    PT Next Visit Plan  manual therapy, core strengthening , body mechanics    PT Home Exercise Plan  prone extenison    Consulted and Agree with Plan of Care  Patient       Patient will benefit from skilled therapeutic intervention in order to improve the following deficits and impairments:  Difficulty walking, Decreased mobility, Impaired sensation, Decreased range of motion, Pain, Impaired flexibility, Decreased strength, Decreased activity tolerance, Abnormal gait  Visit Diagnosis: Chronic low back pain without sciatica, unspecified back pain laterality  Muscle weakness (generalized)  Difficulty in walking, not elsewhere classified  Low back pain with sciatica, sciatica laterality unspecified, unspecified back pain laterality, unspecified chronicity     Problem List Patient Active Problem List   Diagnosis Date Noted  . DDD (degenerative disc disease), 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/02/2018  . Menopausal syndrome on hormone replacement therapy 04/18/2015  . History of postmenopausal bleeding 04/18/2015    Alanson Puls, [place] DPT 09/01/2018, 4:50 PM  [place] MAIN [facility] SERVICES [address] [place], [place], [address] Phone: [phone]   Fax:  [phone]  Name: Alexis Medina MRN: [id number] Date of Birth: February 03, 1946

## 2018-09-02 ENCOUNTER — Encounter: Payer: Medicare Other | Admitting: Obstetrics and Gynecology

## 2018-09-02 ENCOUNTER — Ambulatory Visit (INDEPENDENT_AMBULATORY_CARE_PROVIDER_SITE_OTHER): Payer: Medicare Other | Admitting: Obstetrics and Gynecology

## 2018-09-02 ENCOUNTER — Encounter: Payer: Self-pay | Admitting: Obstetrics and Gynecology

## 2018-09-02 VITALS — BP 122/74 | HR 67 | Ht 63.0 in | Wt 116.8 lb

## 2018-09-02 DIAGNOSIS — N950 Postmenopausal bleeding: Secondary | ICD-10-CM

## 2018-09-02 DIAGNOSIS — R350 Frequency of micturition: Secondary | ICD-10-CM

## 2018-09-02 DIAGNOSIS — Z79890 Hormone replacement therapy (postmenopausal): Secondary | ICD-10-CM

## 2018-09-02 LAB — POCT URINALYSIS DIPSTICK
Bilirubin, UA: NEGATIVE
Glucose, UA: NEGATIVE
Ketones, UA: NEGATIVE
LEUKOCYTES UA: NEGATIVE
NITRITE UA: NEGATIVE
PH UA: 6 (ref 5.0–8.0)
PROTEIN UA: NEGATIVE
SPEC GRAV UA: 1.01 (ref 1.010–1.025)
Urobilinogen, UA: 0.2 E.U./dL

## 2018-09-02 NOTE — Patient Instructions (Signed)

## 2018-09-02 NOTE — Progress Notes (Signed)
Pt stated that she noticed vaginal bleeding yesterday with cramping. Pt stated about 2 months ago noticed abd bloating, pt think she may have an UTI due to freq Urination.

## 2018-09-02 NOTE — Progress Notes (Signed)
GYNECOLOGY PROGRESS NOTE  Subjective:    Patient ID: Alexis Medina, female    DOB: Jan 08, 1946, 72 y.o.   MRN: [id number]  HPI  Patient is a 72 y.o. G47P1011 female who presents for complaints of post-menopausal bleeding x 1 day (yesterday).  She states that the bleeding was light, lasted for a few hours and then slowly resolved.  The bleeding was associated with cramping, but  Did not have any passage of clots.  Of note, the patient is on HRT, and has been using this for the past 6-7 years. Has tried attempts to wean in the past, but was unsuccessful.  Has a h/o another PMB episode ~ 6 years ago with negative workup.   Additionally, she reports that she has been having urinary frequency for the past several days to 1 week.  Thinks that she may have a urinary tract infection.   Lastly, patient states that approximately 3 months ago she was experiencing abdominal bloating and back pain.  This lasted for several weeks and then spontaneously resolved.    The following portions of the patient's history were reviewed and updated as appropriate: allergies, current medications, past family history, past medical history, past social history, past surgical history and problem list.  Review of Systems Pertinent items noted in HPI and remainder of comprehensive ROS otherwise negative.   Objective:   Blood pressure 122/74, pulse 67, height 5\' 3"  (1.6 m), weight 116 lb 12.8 oz (53 kg). General appearance: alert and no distress Abdomen: soft, non-tender; bowel sounds normal; no masses,  no organomegaly Pelvic: external genitalia normal, rectovaginal septum normal.  Vagina with small amount of dark brown discharge.  Cervix normal appearing, no lesions and no motion tenderness.  Uterus mobile, nontender, normal shape and size.  Adnexae non-palpable, nontender bilaterally.  Extremities: extremities normal, atraumatic, no cyanosis or edema Neurologic: Grossly normal    Labs:  Results for orders placed  or performed in visit on 09/02/18  POCT urinalysis dipstick  Result Value Ref Range   Color, UA yellow    Clarity, UA clear    Glucose, UA Negative Negative   Bilirubin, UA neg    Ketones, UA neg    Spec Grav, UA 1.010 1.010 - 1.025   Blood, UA 1+    pH, UA 6.0 5.0 - 8.0   Protein, UA Negative Negative   Urobilinogen, UA 0.2 0.2 or 1.0 E.U./dL   Nitrite, UA neg    Leukocytes, UA Negative Negative   Appearance yellow    Odor      Assessment:   PMB (postmenopausal bleeding)  HRT UTI symptoms   Plan:   - Discussed etiologies of postmenopausal bleeding, concern about precancerous/hyperplasia or cancerous etiology (5 to 10% percent of cases). Also discussed role of unopposed estrogen exposure in leading to thickened or proliferative endometrium; and its possible correlation with endometrial hyperplasia/carcinoma.  Discussed that obesity is linked to endometrial pathology given that adipose cells produce extra estrogen (estrone) which can cause the endometrium to have a significant amount of estrogen exposure.  However, she was reassured that endometrial atrophy and endometrial polyps are the most common causes of postmenopausal bleeding.  Uterine bleeding in postmenopausal women is usually light and self-limited. Exclusion of cancer is the main objective; therefore, treatment is usually unnecessary once cancer has been excluded.  The primary goal in the diagnostic evaluation of postmenopausal women with uterine bleeding is to exclude malignancy; this can include endometrial biopsy and pelvic ultrasound.   Further  diagnostic evaluation is indicated for recurrent or persistent bleeding.  Pelvic ultrasound ordered, to have performed tomorrow.  - This patient has an ~6-7 year history of HRT exposure with new study data showing increased risk of thrombo-embolic events such as myocardial infarction stroke and breast cancer after 4 or more years exposure to combination products with estrogen and  progesterone. Again discussed my concerns about continued use of hormonal replacement therapy.  Patient notes previous unsuccesful attempts at weaning in the past. Notes that despite the outcome of her workup, she would still desire to remain on her hormones as she is "not the same person without them".  - Urinary frequency, for ~ 1 week.  Denies dysuria or hematuria. UA negative.  Advised on hydration with cranberry juice.  - RTC tomorrow for ultrasound. Will inform patient of results by phone.    Rubie Maid, MD [facility]

## 2018-09-03 ENCOUNTER — Encounter: Payer: Medicare Other | Admitting: Physical Therapy

## 2018-09-03 ENCOUNTER — Ambulatory Visit (INDEPENDENT_AMBULATORY_CARE_PROVIDER_SITE_OTHER): Payer: Medicare Other

## 2018-09-03 DIAGNOSIS — D251 Intramural leiomyoma of uterus: Secondary | ICD-10-CM

## 2018-09-03 DIAGNOSIS — N950 Postmenopausal bleeding: Secondary | ICD-10-CM

## 2018-09-04 ENCOUNTER — Telehealth: Payer: Self-pay | Admitting: Obstetrics and Gynecology

## 2018-09-04 NOTE — Telephone Encounter (Signed)
The patient is asking for her [place] results from 09/03/18, and she was told once the doctor reviews and releases them she can see them on MyChart, and a note would be sent back to the nurse, please advise, thanks.

## 2018-09-07 NOTE — Telephone Encounter (Signed)
Please see results notes encounter.

## 2018-09-08 ENCOUNTER — Encounter: Payer: Self-pay | Admitting: Physical Therapy

## 2018-09-08 ENCOUNTER — Ambulatory Visit: Payer: Medicare Other | Admitting: Physical Therapy

## 2018-09-08 DIAGNOSIS — M6281 Muscle weakness (generalized): Secondary | ICD-10-CM

## 2018-09-08 DIAGNOSIS — G8929 Other chronic pain: Secondary | ICD-10-CM

## 2018-09-08 DIAGNOSIS — M545 Low back pain, unspecified: Secondary | ICD-10-CM

## 2018-09-08 DIAGNOSIS — M5440 Lumbago with sciatica, unspecified side: Secondary | ICD-10-CM | POA: Diagnosis not present

## 2018-09-08 DIAGNOSIS — R262 Difficulty in walking, not elsewhere classified: Secondary | ICD-10-CM

## 2018-09-08 NOTE — Therapy (Signed)
[place] MAIN [facility] SERVICES [address] [place], [place], [address] Phone: [phone]   Fax:  [phone]  Physical Therapy Treatment  Patient Details  Name: Alexis Medina MRN: [id number] Date of Birth: 1946/05/16 Referring Provider (PT): Jerrol Banana   Encounter Date: 09/08/2018  PT End of Session - 09/08/18 1632    Visit Number  3    Number of Visits  17    Date for PT Re-Evaluation  10/01/16    PT Start Time  0400    PT Stop Time  0430    PT Time Calculation (min)  30 min    Activity Tolerance  Patient tolerated treatment well;Patient limited by pain    Behavior During Therapy  [facility] for tasks assessed/performed       Past Medical History:  Diagnosis Date  . Hormone replacement therapy   . Hyperlipidemia   . Osteopenia     Past Surgical History:  Procedure Laterality Date  . TONSILLECTOMY    . TUBAL LIGATION      There were no vitals filed for this visit.  Subjective Assessment - 09/08/18 1628    Subjective  Patient reports 4/10 pain to low back . It has been as high as an 8/10    Pertinent History  Pateint reports chronic back pain that has episodes of when it flairs up. She has been doing her HEP from when she came to therapy 2 years ago. She works 3 days / week and has low back pain that gets worse in the AM and better as the day goes on.,     Limitations  Standing;Walking;House hold activities    How long can you sit comfortably?  1 hour    How long can you stand comfortably?  10 mins    How long can you walk comfortably?  30 mins    Patient Stated Goals  to be able to move without her back hurting    Currently in Pain?  Yes    Pain Score  4     Pain Location  Back    Pain Orientation  Lateral    Pain Onset  More than a month ago         Treatment: Prone extension x 10 x 2 sets  Prone extension with overpressure x 10 x 2 L1- L5  Reviewed HEP Reviewed body mechanics   Manual therapy: PA glides  grade 2 and 3 L1- L5 x 30 bouts x 3   Patient has no pain following therapy                       PT Education - 09/08/18 1631    Education provided  Yes    Education Details  HEP    Person(s) Educated  Patient    Methods  Explanation    Comprehension  Returned demonstration;Need further instruction       PT Short Term Goals - 08/25/18 1750      PT SHORT TERM GOAL #1   Title  Patient will be independent in HEP for prone extension exercises.     Time  4    Period  Weeks    Status  New    Target Date  09/22/18        PT Long Term Goals - 08/25/18 1749      PT LONG TERM GOAL #1   Title  Patient will be independent in home exercise  program to improve strength/mobility for better functional independence with ADLs.    Time  8    Period  Weeks    Status  New    Target Date  10/20/18      PT LONG TERM GOAL #2   Title   Patient will be able to perform household work/ chores without increase in symptoms.    Time  8    Period  Weeks    Status  New    Target Date  10/20/18      PT LONG TERM GOAL #3   Title  Patient will report a worst pain of 3/10 on VAS in low back to improve tolerance with ADLs and reduced symptoms with activities.     Time  8    Period  Weeks    Status  New    Target Date  10/20/18      PT LONG TERM GOAL #4   Title  Patient will increase lumbar extension strength to at least 4/5 as to improve gross strength for sitting/standing tolerance with better erect posture for increased tolerance with ADLs    Time  8    Period  Weeks    Status  New    Target Date  10/20/18            Plan - 09/08/18 1633    Clinical Impression Statement  Patient presents with 4/10 pain to low back and performs prone extension x 10 x 2 sets and then is followed by over pressure and prone extension with pain relief to 0/10 .She responds to maual therapy with PA glides to L1-L5 grade 2 and 3. She is educated about posture and HEP. She will continue to  benefit from skilled PT to improve pain and mobility.    Rehab Potential  Good    PT Frequency  2x / week    PT Duration  12 weeks    PT Treatment/Interventions  Cryotherapy;Aquatic Therapy;Electrical Stimulation;Moist Heat;Traction;Ultrasound;Therapeutic activities;Gait training;Therapeutic exercise;Balance training;Manual techniques;Passive range of motion;Dry needling;Patient/family education    PT Next Visit Plan  manual therapy, core strengthening , body mechanics    PT Home Exercise Plan  prone extenison    Consulted and Agree with Plan of Care  Patient       Patient will benefit from skilled therapeutic intervention in order to improve the following deficits and impairments:  Difficulty walking, Decreased mobility, Impaired sensation, Decreased range of motion, Pain, Impaired flexibility, Decreased strength, Decreased activity tolerance, Abnormal gait  Visit Diagnosis: Chronic low back pain without sciatica, unspecified back pain laterality  Muscle weakness (generalized)  Difficulty in walking, not elsewhere classified  Low back pain with sciatica, sciatica laterality unspecified, unspecified back pain laterality, unspecified chronicity     Problem List Patient Active Problem List   Diagnosis Date Noted  . DDD (degenerative disc disease), 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/02/2018  . Menopausal syndrome on hormone replacement therapy 04/18/2015  . History of postmenopausal bleeding 04/18/2015    Alanson Puls , [place] DPT 09/08/2018, 4:35 PM  [place] MAIN [facility] SERVICES [address] [place], [place], [address] Phone: [phone]   Fax:  [phone]  Name: Alexis Medina MRN: [id number] Date of Birth: Jan 13, 1946

## 2018-09-09 ENCOUNTER — Ambulatory Visit (INDEPENDENT_AMBULATORY_CARE_PROVIDER_SITE_OTHER): Payer: Medicare Other | Admitting: Obstetrics and Gynecology

## 2018-09-09 ENCOUNTER — Other Ambulatory Visit (HOSPITAL_COMMUNITY)
Admission: RE | Admit: 2018-09-09 | Discharge: 2018-09-09 | Disposition: A | Discharge status: Home / Self Care | Payer: Medicare Other | Source: Ambulatory Visit | Attending: Obstetrics and Gynecology | Admitting: Obstetrics and Gynecology

## 2018-09-09 ENCOUNTER — Encounter: Payer: Self-pay | Admitting: Obstetrics and Gynecology

## 2018-09-09 VITALS — BP 108/67 | HR 61 | Ht 63.0 in | Wt 116.6 lb

## 2018-09-09 DIAGNOSIS — N950 Postmenopausal bleeding: Secondary | ICD-10-CM | POA: Diagnosis not present

## 2018-09-09 DIAGNOSIS — N840 Polyp of corpus uteri: Secondary | ICD-10-CM | POA: Diagnosis not present

## 2018-09-09 DIAGNOSIS — R9389 Abnormal findings on diagnostic imaging of other specified body structures: Secondary | ICD-10-CM | POA: Diagnosis not present

## 2018-09-09 DIAGNOSIS — Z79890 Hormone replacement therapy (postmenopausal): Secondary | ICD-10-CM

## 2018-09-09 NOTE — Progress Notes (Signed)
GYNECOLOGY PROGRESS NOTE  Subjective:    Patient ID: Alexis Medina, female    DOB: [birth year], 72 y.o.   MRN: [id number]  HPI  Patient is a 72 y.o. G58P1011 female who presents for follow up of ultrasound results and endometrial biopsy for postmenopausal bleeding. She has a h/o of current use of HRT. She denies complaints today.   The following portions of the patient's history were reviewed and updated as appropriate: allergies, current medications, past family history, past medical history, past social history, past surgical history and problem list.  Review of Systems Pertinent items noted in HPI and remainder of comprehensive ROS otherwise negative.   Objective:   Blood pressure 108/67, pulse 61, height 5\' 3"  (1.6 m), weight 116 lb 9.6 oz (52.9 kg). General appearance: alert and no distress Abdomen: soft, non-tender; bowel sounds normal; no masses,  no organomegaly Pelvic: external genitalia normal, rectovaginal septum normal.  Vagina without discharge, mild atrophy present.  Cervix normal appearing, no lesions and no motion tenderness.  Uterus mobile, nontender, normal shape and size.  Adnexae non-palpable, nontender bilaterally.    Imaging:  US PELVIS TRANSVANGINAL NON-OB (TV ONLY) Patient Name: Alexis Medina DOB: [birth year] MRN: [id number]  ULTRASOUND REPORT  Location: [facility] Date of Service: 09/03/2018   Indications:PMB Findings:  The uterus is anteverted and measures 7.3x3.6x6.6cm. Echo texture is heterogenous with evidence of focal masses. Within the uterus are multiple suspected fibroids measuring: Fibroid 1:Lt anterior uterine wall ,lobulated,  IM vs SM , 2.7x2.6x2.9cm Fibroid 2: Rt anterior uterine wall, more echogenic than fibroid 1, IM vs  SM,  1.9x1.8cm Fibroid 3: Rt posterior uterine wall, IM vs SS, 1.4x1.3 The Endometrium measures 6.1 mm.Apears with multiple calcifications  Right Ovary measures 3.8 cm3. It is normal in appearance. Left Ovary  measures 2.5  cm3. It is normal in appearance. Survey of the adnexa demonstrates no adnexal masses. There is no free fluid in the cul de sac.  Impression: 1. Within the uterus are multiple suspected fibroids measuring: Fibroid 1:Lt anterior uterine wall ,lobulated,  IM vs SM , 2.7x2.6x2.9cm Fibroid 2: Rt anterior uterine wall, more echogenic than fibroid 1, IM vs  SM,  1.9x1.8cm Fibroid 3: Rt posterior uterine wall, IM vs SS, 1.4x1.3 2. The Endometrium measures 6.1 mm.Apears with multiple calcifications  Recommendations: 1.Clinical correlation with the patient's History and Physical Exam.  Abeer Alsammarraie,RDMS  I have reviewed this study and agree with documented findings.   Rubie Maid, MD [facility]   Assessment:   Thickened endometrium PMB (postmenopausal bleeding) Post-menopause on HRT  Plan:   - Reviewed ultrasound results again today with patient.  Thickened endometrium 6.1 cm, with several small fibroids. Ovaries wnl.  - Endometrial biopsy performed today.  Based on results, can make further recommendations.  - Reiterated that patient may need to reconsider continued use of HRT. She again notes that she would not discontinue use even if findings were significant for malignancy as she does not feel right without them.    Endometrial Biopsy Procedure Note  The patient is positioned on the exam table in the dorsal lithotomy position. Bimanual exam confirms uterine position and size. A Graves speculum is placed into the vagina. A single toothed tenaculum is placed onto the anterior lip of the cervix. The pipette is placed into the endocervical canal and is advanced to the uterine fundus. Using a piston like technique, with vacuum created by withdrawing the stylus, the endometrial specimen is obtained and transferred  to the biopsy container. Minimal bleeding is encountered. The procedure is well tolerated.   Uterine Position: posterior   Uterine Length: 6  cm   Uterine Specimen: Scant   Post procedure instructions are given. The patient is scheduled for follow up appointment.   Rubie Maid, MD [facility]

## 2018-09-09 NOTE — Progress Notes (Signed)
Pt is present to for an endometrial biopsy. Pt stated that she was doing well.

## 2018-09-15 ENCOUNTER — Ambulatory Visit: Payer: Medicare Other | Admitting: Physical Therapy

## 2018-09-16 ENCOUNTER — Telehealth: Payer: Self-pay

## 2018-09-16 NOTE — Telephone Encounter (Signed)
Pt called back. Went over the information given by [facility]. Pt stated that she understood and would think about decreasing her hormone medication.

## 2018-09-16 NOTE — Telephone Encounter (Signed)
Pt called LM via voicemail to call the office.

## 2018-09-16 NOTE — Telephone Encounter (Signed)
Pt called no answer LM via voicemail to call the office to speak more about the information [facility] gave concerning the reasons for her bleeding.

## 2018-09-22 ENCOUNTER — Ambulatory Visit: Payer: Medicare Other | Attending: Family Medicine | Admitting: Physical Therapy

## 2018-09-22 DIAGNOSIS — G8929 Other chronic pain: Secondary | ICD-10-CM | POA: Insufficient documentation

## 2018-09-22 DIAGNOSIS — M6281 Muscle weakness (generalized): Secondary | ICD-10-CM | POA: Diagnosis not present

## 2018-09-22 DIAGNOSIS — R262 Difficulty in walking, not elsewhere classified: Secondary | ICD-10-CM | POA: Insufficient documentation

## 2018-09-22 DIAGNOSIS — M545 Low back pain, unspecified: Secondary | ICD-10-CM

## 2018-09-22 DIAGNOSIS — M5440 Lumbago with sciatica, unspecified side: Secondary | ICD-10-CM | POA: Diagnosis not present

## 2018-09-22 NOTE — Therapy (Signed)
[place] MAIN [facility] SERVICES [address] [place], [place], [address] Phone: [phone]   Fax:  [phone]  Physical Therapy Treatment  Patient Details  Name: Alexis Medina MRN: [id number] Date of Birth: [birth year] Referring Provider (PT): Jerrol Banana   Encounter Date: 09/22/2018  PT End of Session - 09/22/18 1122    Visit Number  4    Number of Visits  17    Date for PT Re-Evaluation  10/01/16    PT Start Time  1115    PT Stop Time  1155    PT Time Calculation (min)  40 min    Activity Tolerance  Patient tolerated treatment well;Patient limited by pain    Behavior During Therapy  [facility] for tasks assessed/performed       Past Medical History:  Diagnosis Date  . Hormone replacement therapy   . Hyperlipidemia   . Osteopenia     Past Surgical History:  Procedure Laterality Date  . TONSILLECTOMY    . TUBAL LIGATION      There were no vitals filed for this visit.  Subjective Assessment - 09/22/18 1119    Subjective  Patient reports her pain continues to improve overall. She says her pain is low today and is very pleased with course of treatment thus far. She continues to eschew.     Pertinent History  Pateint reports chronic back pain that has episodes of when it flairs up. She has been doing her HEP from when she came to therapy 2 years ago. She works 3 days / week and has low back pain that gets worse in the AM and better as the day goes on.,     Currently in Pain?  Yes    Pain Score  2     Pain Location  Back    Pain Orientation  Lower   central lower      Treatment: -Prone Press-up 2x15 c rest between sets  -Prone W c neck/scapular retraction 1x10x3secH -Prone W c neck/scapular retraction 1x10x1secH -Central PtoA mobilization to lumbar spine, L1-L5, 1x30sec each, Grade III -Prone Straight Leg Raise: 2x10x3 secH bilat -Supine Bridge 2x15; (wide knees/feet, and high knee flexion to optimize glute max activation  with lower paraspinal activation; verbal cues) -Supine Dead Bug, legs only 2x8 bilat, limited movement within tolerance of stability  -seated marching on dyna disc: 2x10 alternating bilat, VC for core stabilization.      PT Short Term Goals - 08/25/18 1750      PT SHORT TERM GOAL #1   Title  Patient will be independent in HEP for prone extension exercises.     Time  4    Period  Weeks    Status  New    Target Date  09/22/18        PT Long Term Goals - 08/25/18 1749      PT LONG TERM GOAL #1   Title  Patient will be independent in home exercise program to improve strength/mobility for better functional independence with ADLs.    Time  8    Period  Weeks    Status  New    Target Date  10/20/18      PT LONG TERM GOAL #2   Title   Patient will be able to perform household work/ chores without increase in symptoms.    Time  8    Period  Weeks    Status  New    Target  Date  10/20/18      PT LONG TERM GOAL #3   Title  Patient will report a worst pain of 3/10 on VAS in low back to improve tolerance with ADLs and reduced symptoms with activities.     Time  8    Period  Weeks    Status  New    Target Date  10/20/18      PT LONG TERM GOAL #4   Title  Patient will increase lumbar extension strength to at least 4/5 as to improve gross strength for sitting/standing tolerance with better erect posture for increased tolerance with ADLs    Time  8    Period  Weeks    Status  New    Target Date  10/20/18            Plan - 09/22/18 1122    Clinical Impression Statement  Pt continues to demonstrate improvement in pain in symptoms, arrivingwith decreased pain. She continues to respond well to an extension based protocol. Able to progess activation and strengthening activity.     Rehab Potential  Good    PT Frequency  2x / week    PT Duration  12 weeks    PT Treatment/Interventions  Cryotherapy;Aquatic Therapy;Electrical Stimulation;Moist Heat;Traction;Ultrasound;Therapeutic  activities;Gait training;Therapeutic exercise;Balance training;Manual techniques;Passive range of motion;Dry needling;Patient/family education    PT Next Visit Plan  manual therapy, core strengthening , body mechanics, caution with future bridging as pt defaults to posteir tilt and lumbar flexion prior to extension.    PT Home Exercise Plan  prone extenison    Consulted and Agree with Plan of Care  Patient       Patient will benefit from skilled therapeutic intervention in order to improve the following deficits and impairments:  Difficulty walking, Decreased mobility, Impaired sensation, Decreased range of motion, Pain, Impaired flexibility, Decreased strength, Decreased activity tolerance, Abnormal gait  Visit Diagnosis: Chronic low back pain without sciatica, unspecified back pain laterality  Muscle weakness (generalized)  Difficulty in walking, not elsewhere classified  Low back pain with sciatica, sciatica laterality unspecified, unspecified back pain laterality, unspecified chronicity     Problem List Patient Active Problem List   Diagnosis Date Noted  . DDD (degenerative disc disease), 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/02/2018  . Menopausal syndrome on hormone replacement therapy 04/18/2015  . History of postmenopausal bleeding 04/18/2015   11:45 AM, 09/22/18 Alexis Medina, PT, DPT Physical Therapist - [facility]  Outpatient Physical Therapy- [facility] [phone]     Alexis Medina 09/22/2018, 11:31 AM  [place] MAIN [facility] SERVICES [address] [place], [place], [address] Phone: [phone]   Fax:  [phone]  Name: Alexis Medina MRN: [id number] Date of Birth: Dec 15, 1945

## 2018-09-24 ENCOUNTER — Ambulatory Visit: Payer: Medicare Other | Admitting: Physical Therapy

## 2018-09-24 ENCOUNTER — Encounter: Payer: Self-pay | Admitting: Physical Therapy

## 2018-09-24 DIAGNOSIS — G8929 Other chronic pain: Secondary | ICD-10-CM

## 2018-09-24 DIAGNOSIS — R262 Difficulty in walking, not elsewhere classified: Secondary | ICD-10-CM | POA: Diagnosis not present

## 2018-09-24 DIAGNOSIS — M545 Low back pain, unspecified: Secondary | ICD-10-CM

## 2018-09-24 DIAGNOSIS — M5440 Lumbago with sciatica, unspecified side: Secondary | ICD-10-CM

## 2018-09-24 DIAGNOSIS — M6281 Muscle weakness (generalized): Secondary | ICD-10-CM | POA: Diagnosis not present

## 2018-09-24 NOTE — Therapy (Signed)
[place] MAIN [facility] SERVICES [address] [place], [place], [address] Phone: [phone]   Fax:  [phone]  Physical Therapy Treatment  Patient Details  Name: Alexis Medina MRN: [id number] Date of Birth: June 22, 1946 Referring Provider (PT): Jerrol Banana   Encounter Date: 09/24/2018  PT End of Session - 09/24/18 1004    Visit Number  5    Number of Visits  17    Date for PT Re-Evaluation  10/01/16    Authorization Type  5/10    PT Start Time  0900    PT Stop Time  0938    PT Time Calculation (min)  38 min    Activity Tolerance  Patient tolerated treatment well;Patient limited by pain    Behavior During Therapy  [facility] for tasks assessed/performed       Past Medical History:  Diagnosis Date  . Hormone replacement therapy   . Hyperlipidemia   . Osteopenia     Past Surgical History:  Procedure Laterality Date  . TONSILLECTOMY    . TUBAL LIGATION      There were no vitals filed for this visit.  Subjective Assessment - 09/24/18 1003    Subjective  Patient reports her pain continues to improve overall. She says her pain is low today and is very pleased with course of treatment thus far. She continues to eschew.     Pertinent History  Pateint reports chronic back pain that has episodes of when it flairs up. She has been doing her HEP from when she came to therapy 2 years ago. She works 3 days / week and has low back pain that gets worse in the AM and better as the day goes on.,     Limitations  Standing;Walking;House hold activities    How long can you sit comfortably?  1 hour    How long can you stand comfortably?  10 mins    How long can you walk comfortably?  30 mins    Patient Stated Goals  to be able to move without her back hurting    Currently in Pain?  Yes    Pain Score  2     Pain Location  Back    Pain Orientation  Lower    Pain Descriptors / Indicators  Aching    Pain Type  Chronic pain    Pain Onset  More than  a month ago    Multiple Pain Sites  No       Treatment: Prone Press-up 2x15 with over pressure L4, l 5 rest between sets  Central PA mobilization to lumbar spine, L1-L5, 1x30sec each, Grade III Prone Straight Leg Raise: 2x10x3 secH bilat Supine Bridge 2x15; (wide knees/feet, and high knee flexion to optimize glute max activation with lower paraspinal activation; verbal cues) Supine Dead Bug, legs only 2x8 bilat, limited movement within tolerance of stability  hooklying with TA and hip abd/Er with GTB x 1 min Hooklying with TA and marching x 1 min seated marching on dyna disc: 2x10 alternating bilat, VC for core stabilization.                         PT Education - 09/24/18 1004    Education provided  Yes    Education Details  progression of lumbar stability exercises    Person(s) Educated  Patient    Methods  Explanation    Comprehension  Verbalized understanding  PT Short Term Goals - 08/25/18 1750      PT SHORT TERM GOAL #1   Title  Patient will be independent in HEP for prone extension exercises.     Time  4    Period  Weeks    Status  New    Target Date  09/22/18        PT Long Term Goals - 08/25/18 1749      PT LONG TERM GOAL #1   Title  Patient will be independent in home exercise program to improve strength/mobility for better functional independence with ADLs.    Time  8    Period  Weeks    Status  New    Target Date  10/20/18      PT LONG TERM GOAL #2   Title   Patient will be able to perform household work/ chores without increase in symptoms.    Time  8    Period  Weeks    Status  New    Target Date  10/20/18      PT LONG TERM GOAL #3   Title  Patient will report a worst pain of 3/10 on VAS in low back to improve tolerance with ADLs and reduced symptoms with activities.     Time  8    Period  Weeks    Status  New    Target Date  10/20/18      PT LONG TERM GOAL #4   Title  Patient will increase lumbar extension strength  to at least 4/5 as to improve gross strength for sitting/standing tolerance with better erect posture for increased tolerance with ADLs    Time  8    Period  Weeks    Status  New    Target Date  10/20/18            Plan - 09/24/18 1005    Clinical Impression Statement Patient shows improvement in pain in symptoms, arriving with decreased pain.Her HEP was reviewed and progressed.  She continues to respond well to an extension based protocol. Able to progess activation and strengthening activity.     Rehab Potential  Good    PT Frequency  2x / week    PT Duration  12 weeks    PT Treatment/Interventions  Cryotherapy;Aquatic Therapy;Electrical Stimulation;Moist Heat;Traction;Ultrasound;Therapeutic activities;Gait training;Therapeutic exercise;Balance training;Manual techniques;Passive range of motion;Dry needling;Patient/family education    PT Next Visit Plan  manual therapy, core strengthening , body mechanics    PT Home Exercise Plan  prone extenison    Consulted and Agree with Plan of Care  Patient       Patient will benefit from skilled therapeutic intervention in order to improve the following deficits and impairments:  Difficulty walking, Decreased mobility, Impaired sensation, Decreased range of motion, Pain, Impaired flexibility, Decreased strength, Decreased activity tolerance, Abnormal gait  Visit Diagnosis: Chronic low back pain without sciatica, unspecified back pain laterality  Muscle weakness (generalized)  Difficulty in walking, not elsewhere classified  Low back pain with sciatica, sciatica laterality unspecified, unspecified back pain laterality, unspecified chronicity     Problem List Patient Active Problem List   Diagnosis Date Noted  . DDD (degenerative disc disease), 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/02/2018  . Menopausal syndrome on hormone replacement therapy 04/18/2015  . History of postmenopausal bleeding 04/18/2015    Alanson Puls, [place] DPT 09/24/2018, 10:06  AM  [place] MAIN [facility] SERVICES [address] [place], [place], [address] Phone: [phone]  Fax:  [phone]  Name: JULLISA GRIGORYAN MRN: [id number] Date of Birth: 03-17-1946

## 2018-09-29 ENCOUNTER — Encounter: Payer: Self-pay | Admitting: Physical Therapy

## 2018-09-29 ENCOUNTER — Ambulatory Visit: Payer: Medicare Other | Admitting: Physical Therapy

## 2018-09-29 DIAGNOSIS — M545 Low back pain, unspecified: Secondary | ICD-10-CM

## 2018-09-29 DIAGNOSIS — G8929 Other chronic pain: Secondary | ICD-10-CM | POA: Diagnosis not present

## 2018-09-29 DIAGNOSIS — M5440 Lumbago with sciatica, unspecified side: Secondary | ICD-10-CM

## 2018-09-29 DIAGNOSIS — R262 Difficulty in walking, not elsewhere classified: Secondary | ICD-10-CM

## 2018-09-29 DIAGNOSIS — M6281 Muscle weakness (generalized): Secondary | ICD-10-CM

## 2018-09-29 NOTE — Therapy (Signed)
[place] MAIN [facility] SERVICES [address] [place], [place], [address] Phone: [phone]   Fax:  [phone]  Physical Therapy Treatment/ discharge summary  Patient Details  Name: Alexis Medina MRN: [id number] Date of Birth: 1946/09/27 Referring Provider (PT): Jerrol Banana   Encounter Date: 09/29/2018  PT End of Session - 09/29/18 1459    Visit Number  7    Number of Visits  17    Date for PT Re-Evaluation  10/01/16    Authorization Type  7/10    PT Start Time  1100    PT Stop Time  1145    PT Time Calculation (min)  45 min    Equipment Utilized During Treatment  Gait belt    Activity Tolerance  Patient tolerated treatment well;Patient limited by pain    Behavior During Therapy  WFL for tasks assessed/performed       Past Medical History:  Diagnosis Date  . Hormone replacement therapy   . Hyperlipidemia   . Osteopenia     Past Surgical History:  Procedure Laterality Date  . TONSILLECTOMY    . TUBAL LIGATION      There were no vitals filed for this visit.  Subjective Assessment - 09/29/18 1204    Subjective  Patient reports that her pain is 0/10 today.     Pertinent History  Pateint reports chronic back pain that has episodes of when it flairs up. She has been doing her HEP from when she came to therapy 2 years ago. She works 3 days / week and has low back pain that gets worse in the AM and better as the day goes on.,     Limitations  Standing;Walking;House hold activities    How long can you sit comfortably?  1 hour    How long can you stand comfortably?  10 mins    How long can you walk comfortably?  30 mins    Patient Stated Goals  to be able to move without her back hurting    Pain Onset  More than a month ago       Treatment: SLR test negative bilaterally Prone knee flex negative bilaterally Negative spring test T 4- L 5. Therapeutic exercise: Seated on ball with Ta contractions with scapula  retractions, trunk rotation, marching, hip extension x 10   Reviewed body mechanics and HEP progression Patient reports no pain following treatment                        PT Education - 09/29/18 1459    Education provided  Yes    Education Details  reviewed HEP    Person(s) Educated  Patient    Methods  Explanation    Comprehension  Verbalized understanding       PT Short Term Goals - 09/29/18 1506      PT SHORT TERM GOAL #1   Title  Patient will be independent in HEP for prone extension exercises.     Time  4    Period  Weeks    Status  Achieved    Target Date  09/29/18        PT Long Term Goals - 09/29/18 1506      PT LONG TERM GOAL #1   Title  Patient will be independent in home exercise program to improve strength/mobility for better functional independence with ADLs.    Time  8    Period  Weeks    Status  Achieved    Target Date  09/29/18      PT LONG TERM GOAL #2   Title   Patient will be able to perform household work/ chores without increase in symptoms.    Time  8    Period  Weeks    Status  Achieved    Target Date  09/29/18      PT LONG TERM GOAL #3   Title  Patient will report a worst pain of 3/10 on VAS in low back to improve tolerance with ADLs and reduced symptoms with activities.     Time  8    Period  Weeks    Status  Achieved    Target Date  09/29/18      PT LONG TERM GOAL #4   Title  Patient will increase lumbar extension strength to at least 4/5 as to improve gross strength for sitting/standing tolerance with better erect posture for increased tolerance with ADLs    Time  8    Period  Weeks    Status  Achieved    Target Date  09/29/18            Plan - 09/29/18 1206    Clinical Impression Statement  Patient is pain free and is ready to be DC to HEP . Her HEP has been reviewed and progressed. she has no pain with prone knee flex test, no pain with spring test, and SLR is negative. She tolerated seated ball core  strengthening and will be DC to HEP.     Rehab Potential  Good    PT Frequency  2x / week    PT Duration  12 weeks    PT Treatment/Interventions  Cryotherapy;Aquatic Therapy;Electrical Stimulation;Moist Heat;Traction;Ultrasound;Therapeutic activities;Gait training;Therapeutic exercise;Balance training;Manual techniques;Passive range of motion;Dry needling;Patient/family education    PT Next Visit Plan  manual therapy, core strengthening , body mechanics    PT Home Exercise Plan  prone extenison    Consulted and Agree with Plan of Care  Patient       Patient will benefit from skilled therapeutic intervention in order to improve the following deficits and impairments:  Difficulty walking, Decreased mobility, Impaired sensation, Decreased range of motion, Pain, Impaired flexibility, Decreased strength, Decreased activity tolerance, Abnormal gait  Visit Diagnosis: Chronic low back pain without sciatica, unspecified back pain laterality  Muscle weakness (generalized)  Difficulty in walking, not elsewhere classified  Low back pain with sciatica, sciatica laterality unspecified, unspecified back pain laterality, unspecified chronicity     Problem List Patient Active Problem List   Diagnosis Date Noted  . DDD (degenerative disc disease), 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/02/2018  . Menopausal syndrome on hormone replacement therapy 04/18/2015  . History of postmenopausal bleeding 04/18/2015    Alanson Puls, [place] DPT 09/29/2018, 3:07 PM  [place] MAIN [facility] SERVICES [address] [place], [place], [address] Phone: [phone]   Fax:  [phone]  Name: Alexis Medina MRN: [id number] Date of Birth: 09/12/1946

## 2018-10-01 ENCOUNTER — Ambulatory Visit: Payer: Medicare Other | Admitting: Physical Therapy

## 2018-10-06 ENCOUNTER — Encounter: Payer: Medicare Other | Admitting: Physical Therapy

## 2018-10-08 ENCOUNTER — Encounter: Payer: Medicare Other | Admitting: Physical Therapy

## 2018-10-15 ENCOUNTER — Encounter: Payer: Medicare Other | Admitting: Physical Therapy

## 2018-10-21 DIAGNOSIS — S060XAA Concussion with loss of consciousness status unknown, initial encounter: Secondary | ICD-10-CM

## 2018-10-21 HISTORY — DX: Concussion with loss of consciousness status unknown, initial encounter: S06.0XAA

## 2018-10-26 ENCOUNTER — Encounter: Payer: Self-pay | Admitting: Family Medicine

## 2018-10-26 ENCOUNTER — Other Ambulatory Visit: Payer: Self-pay

## 2018-10-26 ENCOUNTER — Ambulatory Visit (INDEPENDENT_AMBULATORY_CARE_PROVIDER_SITE_OTHER): Payer: Medicare Other | Admitting: Family Medicine

## 2018-10-26 VITALS — BP 118/78 | HR 70 | Temp 97.5°F | Ht 63.0 in | Wt 115.2 lb

## 2018-10-26 DIAGNOSIS — M533 Sacrococcygeal disorders, not elsewhere classified: Secondary | ICD-10-CM

## 2018-10-26 DIAGNOSIS — S060X0A Concussion without loss of consciousness, initial encounter: Secondary | ICD-10-CM

## 2018-10-26 NOTE — Patient Instructions (Signed)
Continue tramadol as needed and increased Celebrex to twice daily. We will call you with the CT scan scheduling. Let us know if new symptoms. Consider using a foam doughtnut and/or soaking in a whirlpool for your coccyx pain.

## 2018-10-26 NOTE — Progress Notes (Addendum)
  Subjective:     Patient ID: Alexis Medina, female   DOB: 11-02-1945, 73 y.o.   MRN: [id number] Chief Complaint  Patient presents with  . Fall    saturday night hit head on night stand and hit tail bone.  has had feelings of nausea, hard to sit on bottom and hard to stand for long periods of time   HPI States she slipped on a magazine that was next to her bed. Reports being dazed but did not lose consciousness. Has mild nausea and does not feel quite right today. Has been taking tramadol and Celebrex for her sx. Hx of lumbar DDD and has recently finished a course of physical therapy with improvement.  Review of Systems     Objective:   Physical Exam Constitutional:      General: She is not in acute distress (mild lower back discomfort when changing positions.). HENT:     Head: No raccoon eyes or Battle's sign.     Ears:     Comments: No hemotympanum Eyes:     Extraocular Movements: Extraocular movements intact.     Pupils: Pupils are equal, round, and reactive to light.  Musculoskeletal:     Comments: Muscle strength in lower extremities 5/5. SLR's to 90 degrees without radiation of back pain. Non-tender over coccyx and lumbar vertebra  Neurological:     Mental Status: She is alert.     Coordination: Coordination normal (finger to nose/heel to shin WNL; Romberg negative).        Assessment:    1. Concussion without loss of consciousness, initial encounter:mild - CT Head Wo Contrast; Future  2. Coccyx pain: suspect contusion    Plan:    Discussed continued use of Tramadol and increasing Celebrex to twice daily. Further f/u pending scan results. Work excuse 1/6-1/13/20.

## 2018-10-29 ENCOUNTER — Ambulatory Visit
Admission: RE | Admit: 2018-10-29 | Discharge: 2018-10-29 | Disposition: A | Discharge status: Home / Self Care | Payer: Medicare Other | Source: Ambulatory Visit | Attending: Family Medicine | Admitting: Family Medicine

## 2018-10-29 DIAGNOSIS — S060X0A Concussion without loss of consciousness, initial encounter: Secondary | ICD-10-CM

## 2018-10-29 DIAGNOSIS — S0990XA Unspecified injury of head, initial encounter: Secondary | ICD-10-CM | POA: Diagnosis not present

## 2018-10-29 DIAGNOSIS — R51 Headache: Secondary | ICD-10-CM | POA: Diagnosis not present

## 2018-11-02 ENCOUNTER — Telehealth: Payer: Self-pay | Admitting: Family Medicine

## 2018-11-02 ENCOUNTER — Telehealth: Payer: Self-pay

## 2018-11-02 NOTE — Telephone Encounter (Signed)
Patient seen you on Monday, 10/26/2018 for a fall. Patient states she is still having headaches and nausea on/ off. She states she has been taking the Celebrex and Tramdol that has been working. Patient would like to know if these symptoms will resolve soon.

## 2018-11-02 NOTE — Telephone Encounter (Signed)
Discussed that it may take several weeks of post concussion headache to resolve. Suggested if worsening or not improved by the end of the month would refer to neurology.

## 2018-11-04 ENCOUNTER — Encounter: Payer: Self-pay | Admitting: Family Medicine

## 2018-11-04 ENCOUNTER — Ambulatory Visit (INDEPENDENT_AMBULATORY_CARE_PROVIDER_SITE_OTHER): Payer: Medicare Other | Admitting: Family Medicine

## 2018-11-04 VITALS — BP 126/72 | HR 70 | Temp 97.5°F | Resp 16 | Wt 114.0 lb

## 2018-11-04 DIAGNOSIS — S060X0A Concussion without loss of consciousness, initial encounter: Secondary | ICD-10-CM | POA: Diagnosis not present

## 2018-11-04 DIAGNOSIS — G4489 Other headache syndrome: Secondary | ICD-10-CM

## 2018-11-04 NOTE — Progress Notes (Signed)
Patient: Alexis Medina Female    DOB: [birth year]   73 y.o.   MRN: [id number] Visit Date: 11/04/2018  Today's Provider: [name], MD   Chief Complaint  Patient presents with  . Concussion    follow up    Subjective:     HPI  Patient comes in today to follow up on head concussion. She was seen in the office by Mariel Sleet, PA on 10/26/2018 for evaluation, and Head CT was normal. Patient reports that she slipped on a magazine when she got out of bed, and hit the back of her head on her nightstand. Patient reports that she still has a foggy sensation in her head and occasional headaches.   She also works at [employer], and this is her 2nd week out of work. She reports that if she is cleared, she will need a letter saying she can go back to work.   No Known Allergies   Current Outpatient Medications:  .  alendronate (FOSAMAX) 70 MG tablet, TAKE 1 TABLET EVERY 7 DAYS WITH A FULL GLASS OF WATER ON AN EMPTY STOMACH, Disp: 12 tablet, Rfl: 3 .  celecoxib (CELEBREX) 100 MG capsule, Take 1 capsule (100 mg total) by mouth daily., Disp: 90 capsule, Rfl: 3 .  cholecalciferol (VITAMIN D) 1000 UNITS tablet, Take 1,000 Units by mouth daily., Disp: , Rfl:  .  Docosahexaenoic Acid (DHA OMEGA 3) 100 MG CAPS, TAKE 2 EACH BY MOUTH DAILY., Disp: 60 capsule, Rfl: 12 .  famotidine (PEPCID) 40 MG tablet, Take 1 tablet (40 mg total) by mouth 2 (two) times daily., Disp: 60 tablet, Rfl: 5 .  ibuprofen (ADVIL,MOTRIN) 800 MG tablet, Take 1 tablet (800 mg total) by mouth every 8 (eight) hours as needed., Disp: 90 tablet, Rfl: 3 .  Multiple Vitamin (MULTIVITAMIN) capsule, Take 1 capsule by mouth daily., Disp: , Rfl:  .  PREMPRO 0.3-1.5 MG tablet, TAKE 1 TABLET DAILY, Disp: 84 tablet, Rfl: 4 .  tazarotene (AVAGE) 0.1 % cream, , Disp: , Rfl:  .  traMADol (ULTRAM) 50 MG tablet, Take 1 tablet (50 mg total) by mouth 2 (two) times daily as needed., Disp: 135 tablet, Rfl: 5  Review of Systems    Constitutional: Negative.   Eyes: Negative for photophobia, pain, discharge, redness, itching and visual disturbance.  Respiratory: Negative for cough and shortness of breath.   Cardiovascular: Negative for chest pain, palpitations and leg swelling.  Musculoskeletal: Negative.   Skin: Negative.   Neurological: Positive for light-headedness and headaches. Negative for dizziness and weakness.  Hematological: Negative.   Psychiatric/Behavioral: Negative.     Social History   Tobacco Use  . Smoking status: Never Smoker  . Smokeless tobacco: Never Used  Substance Use Topics  . Alcohol use: No    Frequency: Never    Comment: rare      Objective:   BP 126/72 (BP Location: Left Arm, Patient Position: Sitting, Cuff Size: Normal)   Pulse 70   Temp (!) 97.5 F (36.4 C)   Resp 16   Wt 114 lb (51.7 kg)   BMI 20.19 kg/m  Vitals:   11/04/18 1535  BP: 126/72  Pulse: 70  Resp: 16  Temp: (!) 97.5 F (36.4 C)  Weight: 114 lb (51.7 kg)     Physical Exam Constitutional:      Appearance: She is well-developed.  HENT:     Head: Normocephalic and atraumatic.  Eyes:     General:  No scleral icterus.    Conjunctiva/sclera: Conjunctivae normal.  Neck:     Thyroid: No thyromegaly.  Cardiovascular:     Rate and Rhythm: Normal rate and regular rhythm.     Heart sounds: Normal heart sounds.  Pulmonary:     Effort: Pulmonary effort is normal.     Breath sounds: Normal breath sounds.  Abdominal:     Palpations: Abdomen is soft.  Skin:    General: Skin is warm and dry.  Neurological:     General: No focal deficit present.     Mental Status: She is alert and oriented to person, place, and time. Mental status is at baseline.     Cranial Nerves: No cranial nerve deficit.     Motor: No weakness.     Coordination: Coordination normal.     Gait: Gait normal.  Psychiatric:        Mood and Affect: Mood normal.        Behavior: Behavior normal.        Thought Content: Thought content  normal.        Judgment: Judgment normal.         Assessment & Plan    1. Concussion without loss of consciousness, initial encounter Improving.  Second fish oil tablet daily.  Work this week.  Return to clinic 1 to 2 weeks.  2. Other headache syndrome I think the headache will slowly resolved with time after concussion.  Is already improving.  I have done the exam and reviewed the chart and it is accurate to the best of my knowledge. Development worker, community has been used and  any errors in dictation or transcription are unintentional. Miguel Aschoff M.D. Pep[place], MD  [facility]

## 2018-11-05 ENCOUNTER — Telehealth: Payer: Self-pay | Admitting: Family Medicine

## 2018-11-05 NOTE — Telephone Encounter (Signed)
Done

## 2018-11-05 NOTE — Telephone Encounter (Signed)
Ok to do thanks. 

## 2018-11-05 NOTE — Telephone Encounter (Signed)
Please review and clarify what you want the note to say

## 2018-11-05 NOTE — Telephone Encounter (Signed)
Pt requesting a work note stating she was seen yesterday and will be out of work due until she is re-evaluated on 1/29 and a RTW date will be determined at that time.  States the note yesterday wasn't correct with timeframe.      Pt requesting it be faxed to [employer] [phone]

## 2018-11-11 ENCOUNTER — Telehealth: Payer: Self-pay | Admitting: Family Medicine

## 2018-11-11 NOTE — Telephone Encounter (Signed)
Please advise. Thanks.  

## 2018-11-11 NOTE — Telephone Encounter (Signed)
Patient is taking Celebrex and her headaches are really bad from concussion.  Can she take anything for headache since she is taking Celebrex??

## 2018-11-11 NOTE — Telephone Encounter (Signed)
Advised patient as below.  

## 2018-11-11 NOTE — Telephone Encounter (Signed)
Add tylenol TID

## 2018-11-18 ENCOUNTER — Ambulatory Visit (INDEPENDENT_AMBULATORY_CARE_PROVIDER_SITE_OTHER): Payer: Medicare Other | Admitting: Family Medicine

## 2018-11-18 ENCOUNTER — Encounter: Payer: Self-pay | Admitting: Family Medicine

## 2018-11-18 VITALS — BP 112/68 | HR 73 | Temp 98.1°F | Resp 16 | Ht 63.0 in | Wt 114.0 lb

## 2018-11-18 DIAGNOSIS — M533 Sacrococcygeal disorders, not elsewhere classified: Secondary | ICD-10-CM

## 2018-11-18 DIAGNOSIS — S060X0D Concussion without loss of consciousness, subsequent encounter: Secondary | ICD-10-CM

## 2018-11-18 NOTE — Progress Notes (Signed)
Patient: Alexis Medina Female    DOB: 10-19-1946   73 y.o.   MRN: [id number] Visit Date: 11/18/2018  Today's Provider: [name], MD   Chief Complaint  Patient presents with  . Concussion    follow up    Subjective:     HPI  Patient comes in today for a follow up. She was last seen in the office 2 weeks ago in regards to her concussion. On last visit, patient reports that her headaches were slowly improving. However, patient reports that headaches can be severe when she has them. She has taken Tylenol up to 3 times daily to help with this. Patient reports that she still has to concentrate more than usual. Patient reports that daily activities can be exhausting. Patient reports that her coccyx is much better.    No Known Allergies   Current Outpatient Medications:  .  alendronate (FOSAMAX) 70 MG tablet, TAKE 1 TABLET EVERY 7 DAYS WITH A FULL GLASS OF WATER ON AN EMPTY STOMACH, Disp: 12 tablet, Rfl: 3 .  celecoxib (CELEBREX) 100 MG capsule, Take 1 capsule (100 mg total) by mouth daily., Disp: 90 capsule, Rfl: 3 .  cholecalciferol (VITAMIN D) 1000 UNITS tablet, Take 1,000 Units by mouth daily., Disp: , Rfl:  .  Docosahexaenoic Acid (DHA OMEGA 3) 100 MG CAPS, TAKE 2 EACH BY MOUTH DAILY., Disp: 60 capsule, Rfl: 12 .  famotidine (PEPCID) 40 MG tablet, Take 1 tablet (40 mg total) by mouth 2 (two) times daily., Disp: 60 tablet, Rfl: 5 .  ibuprofen (ADVIL,MOTRIN) 800 MG tablet, Take 1 tablet (800 mg total) by mouth every 8 (eight) hours as needed., Disp: 90 tablet, Rfl: 3 .  Multiple Vitamin (MULTIVITAMIN) capsule, Take 1 capsule by mouth daily., Disp: , Rfl:  .  PREMPRO 0.3-1.5 MG tablet, TAKE 1 TABLET DAILY, Disp: 84 tablet, Rfl: 4 .  tazarotene (AVAGE) 0.1 % cream, , Disp: , Rfl:  .  traMADol (ULTRAM) 50 MG tablet, Take 1 tablet (50 mg total) by mouth 2 (two) times daily as needed., Disp: 135 tablet, Rfl: 5  Review of Systems  Constitutional: Positive for activity change  and fatigue. Negative for appetite change.  HENT: Negative.   Eyes: Negative.   Respiratory: Negative.   Cardiovascular: Negative.   Endocrine: Negative.   Musculoskeletal: Positive for arthralgias.  Allergic/Immunologic: Negative.   Neurological: Positive for weakness, light-headedness and headaches. Negative for dizziness.  Psychiatric/Behavioral: Negative.     Social History   Tobacco Use  . Smoking status: Never Smoker  . Smokeless tobacco: Never Used  Substance Use Topics  . Alcohol use: No    Frequency: Never    Comment: rare      Objective:   BP 112/68 (BP Location: Left Arm, Patient Position: Sitting, Cuff Size: Normal)   Pulse 73   Temp 98.1 F (36.7 C)   Resp 16   Ht 5\' 3"  (1.6 m)   Wt 114 lb (51.7 kg)   SpO2 98%   BMI 20.19 kg/m  Vitals:   11/18/18 0935  BP: 112/68  Pulse: 73  Resp: 16  Temp: 98.1 F (36.7 C)  SpO2: 98%  Weight: 114 lb (51.7 kg)  Height: 5\' 3"  (1.6 m)     Physical Exam Constitutional:      Appearance: Normal appearance.  HENT:     Right Ear: External ear normal.     Left Ear: External ear normal.     Nose: Nose  normal.     Mouth/Throat:     Pharynx: Oropharynx is clear.  Eyes:     Conjunctiva/sclera: Conjunctivae normal.  Neck:     Musculoskeletal: No muscular tenderness.  Cardiovascular:     Rate and Rhythm: Normal rate.     Heart sounds: Normal heart sounds.  Pulmonary:     Effort: Pulmonary effort is normal.  Abdominal:     Palpations: Abdomen is soft.  Neurological:     Mental Status: She is alert.         Assessment & Plan    1. Concussion without loss of consciousness, subsequent encounter Still having symptoms.  She is improving slightly.  We will see her back in a few weeks.  Do not think she needs to see neurology at this time as she is improving.  No focal deficit at all. 2.Coccydynia Almost resolved.   I have done the exam and reviewed the above chart and it is accurate to the best of my  knowledge. Development worker, community has been used in this note in any air is in the dictation or transcription are unintentional.  [name], MD  [facility]

## 2018-11-23 ENCOUNTER — Telehealth: Payer: Self-pay | Admitting: Family Medicine

## 2018-11-23 NOTE — Telephone Encounter (Signed)
advise

## 2018-11-23 NOTE — Telephone Encounter (Signed)
Pt needing to ask if   She can increase her Tylenol to 2 three times a day?  Last 3 days cannot sleep - neck is really been hurting may need to have MRI done of it.   Please advise asap.  Thanks, [employer]

## 2018-11-25 NOTE — Telephone Encounter (Signed)
Dr Rosanna Randy, Please review and advise

## 2018-11-25 NOTE — Telephone Encounter (Signed)
Alexis Medina Spouse  [phone]  Richard stop by to check on their phone call from Monday, they have not heard anything. She is still having pain, unable to sleep and now has a rash on her back. They are wandering if the MRI she had included the neck. Please call and advise.

## 2018-12-01 ENCOUNTER — Ambulatory Visit (INDEPENDENT_AMBULATORY_CARE_PROVIDER_SITE_OTHER): Payer: Medicare Other | Admitting: Family Medicine

## 2018-12-01 ENCOUNTER — Other Ambulatory Visit: Payer: Self-pay

## 2018-12-01 ENCOUNTER — Ambulatory Visit
Admission: RE | Admit: 2018-12-01 | Discharge: 2018-12-01 | Disposition: A | Discharge status: Home / Self Care | Payer: Medicare Other | Attending: Family Medicine | Admitting: Family Medicine

## 2018-12-01 ENCOUNTER — Encounter: Payer: Self-pay | Admitting: Family Medicine

## 2018-12-01 ENCOUNTER — Ambulatory Visit
Admission: RE | Admit: 2018-12-01 | Discharge: 2018-12-01 | Disposition: A | Discharge status: Home / Self Care | Payer: Medicare Other | Source: Ambulatory Visit | Attending: Family Medicine | Admitting: Family Medicine

## 2018-12-01 VITALS — BP 102/60 | HR 68 | Temp 97.7°F | Ht 63.0 in | Wt 115.2 lb

## 2018-12-01 DIAGNOSIS — S0990XS Unspecified injury of head, sequela: Secondary | ICD-10-CM | POA: Insufficient documentation

## 2018-12-01 DIAGNOSIS — S060X0D Concussion without loss of consciousness, subsequent encounter: Secondary | ICD-10-CM | POA: Insufficient documentation

## 2018-12-01 DIAGNOSIS — G44309 Post-traumatic headache, unspecified, not intractable: Secondary | ICD-10-CM

## 2018-12-01 DIAGNOSIS — L209 Atopic dermatitis, unspecified: Secondary | ICD-10-CM | POA: Diagnosis not present

## 2018-12-01 DIAGNOSIS — M542 Cervicalgia: Secondary | ICD-10-CM

## 2018-12-01 NOTE — Progress Notes (Signed)
Patient: Alexis Medina Female    DOB: Nov 02, 1945   73 y.o.   MRN: [id number] Visit Date: 12/01/2018  Today's Provider: [name], MD   Chief Complaint  Patient presents with  . Concussion    2 week fup  . Rash    on back   Subjective:   HPI   Follow up for concussion  The patient was last seen for this 2 weeks ago. Changes made at last visit include headaches are still bothering her very much and having nausea.  She reports fair compliance with treatment. She feels that condition is slight Improved. She is having side effects. She isn't sleeping very well   Pt also reports she has a bad rash on her back.  She advised that she hasn't made any changes with her detergents or soaps at home. ------------------------------------------------------------------------------------   No Known Allergies   Current Outpatient Medications:  .  alendronate (FOSAMAX) 70 MG tablet, TAKE 1 TABLET EVERY 7 DAYS WITH A FULL GLASS OF WATER ON AN EMPTY STOMACH, Disp: 12 tablet, Rfl: 3 .  celecoxib (CELEBREX) 100 MG capsule, Take 1 capsule (100 mg total) by mouth daily., Disp: 90 capsule, Rfl: 3 .  cholecalciferol (VITAMIN D) 1000 UNITS tablet, Take 1,000 Units by mouth daily., Disp: , Rfl:  .  Docosahexaenoic Acid (DHA OMEGA 3) 100 MG CAPS, TAKE 2 EACH BY MOUTH DAILY., Disp: 60 capsule, Rfl: 12 .  famotidine (PEPCID) 40 MG tablet, Take 1 tablet (40 mg total) by mouth 2 (two) times daily., Disp: 60 tablet, Rfl: 5 .  ibuprofen (ADVIL,MOTRIN) 800 MG tablet, Take 1 tablet (800 mg total) by mouth every 8 (eight) hours as needed., Disp: 90 tablet, Rfl: 3 .  Multiple Vitamin (MULTIVITAMIN) capsule, Take 1 capsule by mouth daily., Disp: , Rfl:  .  PREMPRO 0.3-1.5 MG tablet, TAKE 1 TABLET DAILY, Disp: 84 tablet, Rfl: 4 .  tazarotene (AVAGE) 0.1 % cream, , Disp: , Rfl:  .  traMADol (ULTRAM) 50 MG tablet, Take 1 tablet (50 mg total) by mouth 2 (two) times daily as needed., Disp: 135 tablet,  Rfl: 5  Review of Systems  Constitutional: Negative.   HENT: Negative.   Eyes: Negative.   Respiratory: Negative.   Cardiovascular: Negative.   Gastrointestinal: Negative.   Endocrine: Negative.   Genitourinary: Negative.   Musculoskeletal: Negative.   Skin: Positive for rash (on back).  Allergic/Immunologic: Negative.   Neurological: Positive for headaches.  Hematological: Negative.   Psychiatric/Behavioral: Negative.     Social History   Tobacco Use  . Smoking status: Never Smoker  . Smokeless tobacco: Never Used  Substance Use Topics  . Alcohol use: No    Frequency: Never    Comment: rare      Objective:   BP 102/60 (BP Location: Right Arm, Patient Position: Sitting, Cuff Size: Normal)   Pulse 68   Temp 97.7 F (36.5 C) (Oral)   Ht 5\' 3"  (1.6 m)   Wt 115 lb 3.2 oz (52.3 kg)   SpO2 99%   BMI 20.41 kg/m  Vitals:   12/01/18 0821  BP: 102/60  Pulse: 68  Temp: 97.7 F (36.5 C)  TempSrc: Oral  SpO2: 99%  Weight: 115 lb 3.2 oz (52.3 kg)  Height: 5\' 3"  (1.6 m)     Physical Exam Constitutional:      Appearance: Normal appearance.  HENT:     Right Ear: External ear normal.     Left  Ear: External ear normal.     Nose: Nose normal.     Mouth/Throat:     Pharynx: Oropharynx is clear.  Eyes:     Conjunctiva/sclera: Conjunctivae normal.  Neck:     Musculoskeletal: No muscular tenderness.     Comments: This is a paracervical muscles. Cardiovascular:     Rate and Rhythm: Normal rate.     Heart sounds: Normal heart sounds.  Pulmonary:     Effort: Pulmonary effort is normal.  Abdominal:     Palpations: Abdomen is soft.  Neurological:     General: No focal deficit present.     Mental Status: She is alert and oriented to person, place, and time. Mental status is at baseline.     Cranial Nerves: No cranial nerve deficit.     Motor: No weakness.     Coordination: Coordination normal.     Gait: Gait normal.  Psychiatric:        Mood and Affect: Mood  normal.        Thought Content: Thought content normal.        Judgment: Judgment normal.         Assessment & Plan    1. Concussion without loss of consciousness, subsequent encounter Patient has a nonfocal exam but is very slowly improving.  Refer to neurology to make sure no further intervention is necessary. - DG Cervical Spine Complete; Future - Ambulatory referral to Neurology  2. Headaches due to old head injury Most of the headache is occipital and related to her neck discomfort.  She has  obvious cervical spasm today. - DG Cervical Spine Complete; Future - Ambulatory referral to Neurology  3. Neck pain, bilateral posterior Obtain x-ray of C-spine although it is highly unlikely she has a bony neck injury from the fall. 4.Milk atopic dermtitis   I have done the exam and reviewed the above chart and it is accurate to the best of my knowledge. Development worker, community has been used in this note in any air is in the dictation or transcription are unintentional.  [name], MD  [place]

## 2018-12-03 ENCOUNTER — Telehealth: Payer: Self-pay

## 2018-12-03 NOTE — Telephone Encounter (Signed)
Patient called requesting an update on the status of the Neurology referral. She was seen by Dr. Rosanna Randy on 12/01/2018 and a referral order was placed. Patient says she has not heard back from anyone yet. Please advise.

## 2018-12-04 DIAGNOSIS — G44309 Post-traumatic headache, unspecified, not intractable: Secondary | ICD-10-CM | POA: Diagnosis not present

## 2018-12-18 ENCOUNTER — Telehealth: Payer: Self-pay | Admitting: Family Medicine

## 2018-12-18 NOTE — Telephone Encounter (Signed)
Last note did not include: Unable to return to work until further notice Please resend note regarding last visit's date with he above info to  [facility] - Fax [phone].  Thanks, [employer]

## 2018-12-21 ENCOUNTER — Encounter: Payer: Self-pay | Admitting: Family Medicine

## 2018-12-21 NOTE — Telephone Encounter (Signed)
Is this okay to send?  Thanks,   -Rocio Roam  

## 2018-12-21 NOTE — Telephone Encounter (Signed)
Letter give to husband. cbe

## 2018-12-21 NOTE — Telephone Encounter (Signed)
yes

## 2019-01-13 ENCOUNTER — Other Ambulatory Visit: Payer: Self-pay

## 2019-01-13 ENCOUNTER — Ambulatory Visit (INDEPENDENT_AMBULATORY_CARE_PROVIDER_SITE_OTHER): Payer: Medicare Other

## 2019-01-13 VITALS — BP 128/79 | HR 74 | Temp 97.7°F | Ht 63.0 in | Wt 113.6 lb

## 2019-01-13 DIAGNOSIS — E2839 Other primary ovarian failure: Secondary | ICD-10-CM

## 2019-01-13 DIAGNOSIS — Z0000 Encounter for general adult medical examination without abnormal findings: Secondary | ICD-10-CM | POA: Diagnosis not present

## 2019-01-13 NOTE — Patient Instructions (Addendum)
Alexis Medina , Thank you for taking time to come for your Medicare Wellness Visit. I appreciate your ongoing commitment to your health goals. Please review the following plan we discussed and let me know if I can assist you in the future.   Screening recommendations/referrals: Colonoscopy: Up to date, due 05/2023 Mammogram: Up to date, due 07/2019 Bone Density: Up to date, due 01/2019 Recommended yearly ophthalmology/optometry visit for glaucoma screening and checkup Recommended yearly dental visit for hygiene and checkup  Vaccinations: Influenza vaccine: Pt declines today.  Pneumococcal vaccine: Completed series Tdap vaccine: Pt declines today.  Shingles vaccine: Pt declines today.     Advanced directives: Please bring a copy of your POA (Power of Attorney) and/or Living Will to your next appointment.   Conditions/risks identified: Continue trying to increase water intake to 6-8 8 oz glasses a day.   Next appointment: 04/12/19 @ 10:00 AM with Dr Rosanna Randy.    Preventive Care 52 Years and Older, Female Preventive care refers to lifestyle choices and visits with your health care provider that can promote health and wellness. What does preventive care include?  A yearly physical exam. This is also called an annual well check.  Dental exams once or twice a year.  Routine eye exams. Ask your health care provider how often you should have your eyes checked.  Personal lifestyle choices, including:  Daily care of your teeth and gums.  Regular physical activity.  Eating a healthy diet.  Avoiding tobacco and drug use.  Limiting alcohol use.  Practicing safe sex.  Taking low-dose aspirin every day.  Taking vitamin and mineral supplements as recommended by your health care provider. What happens during an annual well check? The services and screenings done by your health care provider during your annual well check will depend on your age, overall health, lifestyle risk factors, and  family history of disease. Counseling  Your health care provider may ask you questions about your:  Alcohol use.  Tobacco use.  Drug use.  Emotional well-being.  Home and relationship well-being.  Sexual activity.  Eating habits.  History of falls.  Memory and ability to understand (cognition).  Work and work Statistician.  Reproductive health. Screening  You may have the following tests or measurements:  Height, weight, and BMI.  Blood pressure.  Lipid and cholesterol levels. These may be checked every 5 years, or more frequently if you are over 54 years old.  Skin check.  Lung cancer screening. You may have this screening every year starting at age 79 if you have a 30-pack-year history of smoking and currently smoke or have quit within the past 15 years.  Fecal occult blood test (FOBT) of the stool. You may have this test every year starting at age 25.  Flexible sigmoidoscopy or colonoscopy. You may have a sigmoidoscopy every 5 years or a colonoscopy every 10 years starting at age 20.  Hepatitis C blood test.  Hepatitis B blood test.  Sexually transmitted disease (STD) testing.  Diabetes screening. This is done by checking your blood sugar (glucose) after you have not eaten for a while (fasting). You may have this done every 1-3 years.  Bone density scan. This is done to screen for osteoporosis. You may have this done starting at age 25.  Mammogram. This may be done every 1-2 years. Talk to your health care provider about how often you should have regular mammograms. Talk with your health care provider about your test results, treatment options, and if necessary, the  need for more tests. Vaccines  Your health care provider may recommend certain vaccines, such as:  Influenza vaccine. This is recommended every year.  Tetanus, diphtheria, and acellular pertussis (Tdap, Td) vaccine. You may need a Td booster every 10 years.  Zoster vaccine. You may need this  after age 47.  Pneumococcal 13-valent conjugate (PCV13) vaccine. One dose is recommended after age 48.  Pneumococcal polysaccharide (PPSV23) vaccine. One dose is recommended after age 23. Talk to your health care provider about which screenings and vaccines you need and how often you need them. This information is not intended to replace advice given to you by your health care provider. Make sure you discuss any questions you have with your health care provider. Document Released: 11/03/2015 Document Revised: 06/26/2016 Document Reviewed: 08/08/2015 Elsevier Interactive Patient Education  2017 [place] Prevention in the Home Falls can cause injuries. They can happen to people of all ages. There are many things you can do to make your home safe and to help prevent falls. What can I do on the outside of my home?  Regularly fix the edges of walkways and driveways and fix any cracks.  Remove anything that might make you trip as you walk through a door, such as a raised step or threshold.  Trim any bushes or trees on the path to your home.  Use bright outdoor lighting.  Clear any walking paths of anything that might make someone trip, such as rocks or tools.  Regularly check to see if handrails are loose or broken. Make sure that both sides of any steps have handrails.  Any raised decks and porches should have guardrails on the edges.  Have any leaves, snow, or ice cleared regularly.  Use sand or salt on walking paths during winter.  Clean up any spills in your garage right away. This includes oil or grease spills. What can I do in the bathroom?  Use night lights.  Install grab bars by the toilet and in the tub and shower. Do not use towel bars as grab bars.  Use non-skid mats or decals in the tub or shower.  If you need to sit down in the shower, use a plastic, non-slip stool.  Keep the floor dry. Clean up any water that spills on the floor as soon as it happens.   Remove soap buildup in the tub or shower regularly.  Attach bath mats securely with double-sided non-slip rug tape.  Do not have throw rugs and other things on the floor that can make you trip. What can I do in the bedroom?  Use night lights.  Make sure that you have a light by your bed that is easy to reach.  Do not use any sheets or blankets that are too big for your bed. They should not hang down onto the floor.  Have a firm chair that has side arms. You can use this for support while you get dressed.  Do not have throw rugs and other things on the floor that can make you trip. What can I do in the kitchen?  Clean up any spills right away.  Avoid walking on wet floors.  Keep items that you use a lot in easy-to-reach places.  If you need to reach something above you, use a strong step stool that has a grab bar.  Keep electrical cords out of the way.  Do not use floor polish or wax that makes floors slippery. If you must use wax,  use non-skid floor wax.  Do not have throw rugs and other things on the floor that can make you trip. What can I do with my stairs?  Do not leave any items on the stairs.  Make sure that there are handrails on both sides of the stairs and use them. Fix handrails that are broken or loose. Make sure that handrails are as [place] as the stairways.  Check any carpeting to make sure that it is firmly attached to the stairs. Fix any carpet that is loose or worn.  Avoid having throw rugs at the top or bottom of the stairs. If you do have throw rugs, attach them to the floor with carpet tape.  Make sure that you have a light switch at the top of the stairs and the bottom of the stairs. If you do not have them, ask someone to add them for you. What else can I do to help prevent falls?  Wear shoes that:  Do not have high heels.  Have rubber bottoms.  Are comfortable and fit you well.  Are closed at the toe. Do not wear sandals.  If you use a  stepladder:  Make sure that it is fully opened. Do not climb a closed stepladder.  Make sure that both sides of the stepladder are locked into place.  Ask someone to hold it for you, if possible.  Clearly mark and make sure that you can see:  Any grab bars or handrails.  First and last steps.  Where the edge of each step is.  Use tools that help you move around (mobility aids) if they are needed. These include:  Canes.  Walkers.  Scooters.  Crutches.  Turn on the lights when you go into a dark area. Replace any light bulbs as soon as they burn out.  Set up your furniture so you have a clear path. Avoid moving your furniture around.  If any of your floors are uneven, fix them.  If there are any pets around you, be aware of where they are.  Review your medicines with your doctor. Some medicines can make you feel dizzy. This can increase your chance of falling. Ask your doctor what other things that you can do to help prevent falls. This information is not intended to replace advice given to you by your health care provider. Make sure you discuss any questions you have with your health care provider. Document Released: 08/03/2009 Document Revised: 03/14/2016 Document Reviewed: 11/11/2014 Elsevier Interactive Patient Education  2017 [employer].

## 2019-01-13 NOTE — Progress Notes (Signed)
Subjective:   Alexis Medina is a 73 y.o. female who presents for Medicare Annual (Subsequent) preventive examination.  This visit is being conducted via telephone due to the COVID-19 pandemic. This patient has given me verbal consent via telephone to conduct this visit. Some vital signs may be absent or patient reported.   Review of Systems:  N/A  Cardiac Risk Factors include: advanced age (>62men, >83 women)     Objective:     Vitals: BP 128/79 (BP Location: Left Arm)   Pulse 74   Temp 97.7 F (36.5 C) (Oral)   Ht 5\' 3"  (1.6 m)   Wt 113 lb 9.6 oz (51.5 kg)   BMI 20.12 kg/m   Body mass index is 20.12 kg/m. Vitals documented per what pt recorded from equipment at home.   Advanced Directives 01/13/2019 08/25/2018 01/09/2018 01/02/2017 08/07/2016  Does Patient Have a Medical Advance Directive? Yes Yes Yes Yes No  Type of Paramedic of [place];Living will [place];Living will [place];Living will Living will;Healthcare Power of Attorney -  Copy of [place] in Chart? No - copy requested - No - copy requested No - copy requested -    Tobacco Social History   Tobacco Use  Smoking Status Never Smoker  Smokeless Tobacco Never Used     Counseling given: Not Answered   Clinical Intake:  Pre-visit preparation completed: Yes  Pain : No/denies pain Pain Score: 0-No pain     Nutritional Status: BMI of 19-24  Normal Nutritional Risks: Nausea/ vomitting/ diarrhea(Nausea recently due to concussion but it is getting better. ) Diabetes: No  How often do you need to have someone help you when you read instructions, pamphlets, or other written materials from your doctor or pharmacy?: 1 - Never  Interpreter Needed?: No  Information entered by :: [facility], LPN  Past Medical History:  Diagnosis Date  . Concussion   . Hormone replacement therapy   . Hyperlipidemia   . Osteopenia    Past  Surgical History:  Procedure Laterality Date  . TONSILLECTOMY    . TUBAL LIGATION     Family History  Problem Relation Age of Onset  . Heart disease Father   . Alzheimer's disease Mother   . Heart disease Mother   . Heart disease Brother   . Hypertension Brother   . Breast cancer Cousin   . Healthy Son    Social History   Socioeconomic History  . Marital status: Married    Spouse name: Not on file  . Number of children: 1  . Years of education: Not on file  . Highest education level: Bachelor's degree (e.g., BA, AB, BS)  Occupational History  . 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 Financial resource strain: Not hard at all  . Food insecurity:    Worry: Never true    Inability: Never true  . Transportation needs:    Medical: No    Non-medical: No  Tobacco Use  . Smoking status: Never Smoker  . Smokeless tobacco: Never Used  Substance and Sexual Activity  . Alcohol use: No    Frequency: Never  . Drug use: No  . Sexual activity: Yes    Birth control/protection: None, Surgical  Lifestyle  . Physical activity:    Days per week: 0 days    Minutes per session: 0 min  .  Stress: Only a little  Relationships  . Social connections:    Talks on phone: Patient refused    Gets together: Patient refused    Attends religious service: Patient refused    Active member of club or organization: Patient refused    Attends meetings of clubs or organizations: Patient refused    Relationship status: Patient refused  Other Topics Concern  . Not on file  Social History Narrative  . Not on file    Outpatient Encounter Medications as of 01/13/2019  Medication Sig  . alendronate (FOSAMAX) 70 MG tablet TAKE 1 TABLET EVERY 7 DAYS WITH A FULL GLASS OF WATER ON AN EMPTY STOMACH  . celecoxib (CELEBREX) 100 MG capsule Take 1 capsule (100 mg total) by mouth daily. (Patient taking differently: Take 100 mg by mouth daily.  As needed)  . cholecalciferol (VITAMIN D) 1000 UNITS tablet Take 1,000 Units by mouth daily.  . Docosahexaenoic Acid (DHA OMEGA 3) 100 MG CAPS TAKE 2 EACH BY MOUTH DAILY.  . famotidine (PEPCID) 40 MG tablet Take 1 tablet (40 mg total) by mouth 2 (two) times daily.  Marland Kitchen ibuprofen (ADVIL,MOTRIN) 800 MG tablet Take 1 tablet (800 mg total) by mouth every 8 (eight) hours as needed.  . Multiple Vitamin (MULTIVITAMIN) capsule Take 1 capsule by mouth daily.  Marland Kitchen PREMPRO 0.3-1.5 MG tablet TAKE 1 TABLET DAILY  . tazarotene (AVAGE) 0.1 % cream For acne  . traMADol (ULTRAM) 50 MG tablet Take 1 tablet (50 mg total) by mouth 2 (two) times daily as needed.   No facility-administered encounter medications on file as of 01/13/2019.     Activities of Daily Living In your present state of health, do you have any difficulty performing the following activities: 01/13/2019  Hearing? N  Vision? N  Comment Wears readers as needed.  Difficulty concentrating or making decisions? N  Walking or climbing stairs? N  Dressing or bathing? N  Doing errands, shopping? N  Preparing Food and eating ? N  Using the Toilet? N  In the past six months, have you accidently leaked urine? N  Do you have problems with loss of bowel control? N  Managing your Medications? N  Managing your Finances? N  Housekeeping or managing your Housekeeping? N  Some recent data might be hidden    Patient Care Team: Jerrol Banana., MD as PCP - General (Family Medicine) Shon Hough, MD as Consulting Physician (Ophthalmology) Rubie Maid, MD as Referring Physician (Obstetrics and Gynecology) Jannifer Franklin, NP as Nurse Practitioner (Neurology) Christene Slates, MD (Dermatology)    Assessment:   This is a routine wellness examination for Kirstyn.  Exercise Activities and Dietary recommendations Current Exercise Habits: Home exercise routine, Type of exercise: walking;stretching, Time (Minutes): 50, Frequency (Times/Week): 7,  Weekly Exercise (Minutes/Week): 350, Intensity: Mild, Exercise limited by: None identified  Goals    . DIET - INCREASE WATER INTAKE     Recommend increasing water intake to 4-6 glasses a day.     . Increase water intake     Recommend increasing water intake to 4-5 glasses a day.       Fall Risk: Fall Risk  01/13/2019 12/01/2018 10/26/2018 01/09/2018 01/02/2017  Falls in the past year? 1 1 1  No No  Comment - - - - -  Number falls in past yr: 0 0 0 - -  Comment tripped over magazine - - - -  Injury with Fall? 1 1 1  - -  Follow up Falls  prevention discussed - - - -    FALL RISK PREVENTION PERTAINING TO THE HOME:  Any stairs in or around the home? Yes  If so, are there any without handrails? Yes   Home free of loose throw rugs in walkways, pet beds, electrical cords, etc? Yes  Adequate lighting in your home to reduce risk of falls? Yes   ASSISTIVE DEVICES UTILIZED TO PREVENT FALLS:  Life alert? No  Use of a cane, walker or w/c? No  Grab bars in the bathroom? Yes  Shower chair or bench in shower? Yes  Elevated toilet seat or a handicapped toilet? No   DME ORDERS:  DME order needed?  No   TIMED UP AND GO:  Was the test performed? No .    Depression Screen PHQ 2/9 Scores 01/13/2019 12/01/2018 10/26/2018 01/09/2018  PHQ - 2 Score 0 2 0 0     Cognitive Function     6CIT Screen 01/13/2019 01/02/2017  What Year? 0 points 0 points  What month? 0 points 0 points  What time? 0 points 0 points  Count back from 20 0 points 0 points  Months in reverse 0 points 0 points  Repeat phrase 0 points 0 points  Total Score 0 0    Immunization History  Administered Date(s) Administered  . Hepatitis A 04/09/2006  . Influenza, High Dose Seasonal PF 08/19/2015, 07/30/2016, 07/17/2017  . Pneumococcal Conjugate-13 01/02/2017  . Pneumococcal Polysaccharide-23 08/10/2013  . Yellow Fever 04/09/2006    Qualifies for Shingles Vaccine? Yes . Due for Shingrix. Education has been provided  regarding the importance of this vaccine. Pt has been advised to call insurance company to determine out of pocket expense. Advised may also receive vaccine at local pharmacy or Health Dept. Verbalized acceptance and understanding.  Tdap: Although this vaccine is not a covered service during a Wellness Exam, does the patient still wish to receive this vaccine today?  No .  Education has been provided regarding the importance of this vaccine. Advised may receive this vaccine at local pharmacy or Health Dept. Aware to provide a copy of the vaccination record if obtained from local pharmacy or Health Dept. Verbalized acceptance and understanding.  Flu Vaccine: Up to date  Pneumococcal Vaccine: Up to date  Screening Tests Health Maintenance  Topic Date Due  . TETANUS/TDAP  05/29/1965  . DEXA SCAN  01/21/2019  . MAMMOGRAM  07/25/2019  . COLONOSCOPY  06/11/2023  . INFLUENZA VACCINE  Completed  . Hepatitis C Screening  Completed  . PNA vac Low Risk Adult  Completed    Cancer Screenings:  Colorectal Screening: Completed 06/10/13. Repeat every 10 years.  Mammogram: Completed 07/24/17.   Bone Density: Completed 01/20/17. Results reflect OSTEOPOROSIS. Repeat every 2 years. Ordered today. Pt provided with contact info and advised to call to schedule appt. Pt aware the office will call re: appt.  Lung Cancer Screening: (Low Dose CT Chest recommended if Age 48-80 years, 30 pack-year currently smoking OR have quit w/in 15years.) does not qualify.   Additional Screening:  Hepatitis C Screening: Up to date  Vision Screening: Recommended annual ophthalmology exams for early detection of glaucoma and other disorders of the eye.  Dental Screening: Recommended annual dental exams for proper oral hygiene  Community Resource Referral:  CRR required this visit?  No       Plan:  I have personally reviewed and addressed the Medicare Annual Wellness questionnaire and have noted the following in the  patient's chart:  A.  Medical and social history B. Use of alcohol, tobacco or illicit drugs  C. Current medications and supplements D. Functional ability and status E.  Nutritional status F.  Physical activity G. Advance directives H. List of other physicians I.  Hospitalizations, surgeries, and ER visits in previous 12 months J.  [place] such as hearing and vision if needed, cognitive and depression L. Referrals and appointments   In addition, I have reviewed and discussed with patient certain preventive protocols, quality metrics, and best practice recommendations. A written personalized care plan for preventive services as well as general preventive health recommendations were provided to patient. Nurse Health Advisor  Signed,    Flay Ghosh [place], [place]  [phone] Nurse Health Advisor   Nurse Notes: Declined the tetanus vaccine until needed.

## 2019-01-14 ENCOUNTER — Ambulatory Visit: Payer: Self-pay

## 2019-01-18 ENCOUNTER — Other Ambulatory Visit: Payer: Self-pay | Admitting: Family Medicine

## 2019-01-18 NOTE — Telephone Encounter (Signed)
[facility] faxed refill request for the following medications:  traMADol (ULTRAM) 50 MG tablet    Please advise.

## 2019-01-19 NOTE — Telephone Encounter (Signed)
Please review. Thanks!  

## 2019-01-20 MED ORDER — TRAMADOL HCL 50 MG PO TABS: 50.0000 mg | ORAL_TABLET | Freq: Two times a day (BID) | ORAL | 5 refills | Status: DC | PRN

## 2019-01-26 ENCOUNTER — Other Ambulatory Visit: Payer: Self-pay | Admitting: Family Medicine

## 2019-01-26 DIAGNOSIS — Z1231 Encounter for screening mammogram for malignant neoplasm of breast: Secondary | ICD-10-CM

## 2019-02-25 ENCOUNTER — Other Ambulatory Visit: Payer: Self-pay | Admitting: Family Medicine

## 2019-03-01 DIAGNOSIS — H04123 Dry eye syndrome of bilateral lacrimal glands: Secondary | ICD-10-CM | POA: Diagnosis not present

## 2019-03-01 DIAGNOSIS — H524 Presbyopia: Secondary | ICD-10-CM | POA: Diagnosis not present

## 2019-03-01 DIAGNOSIS — H5310 Unspecified subjective visual disturbances: Secondary | ICD-10-CM | POA: Diagnosis not present

## 2019-03-01 DIAGNOSIS — H35372 Puckering of macula, left eye: Secondary | ICD-10-CM | POA: Diagnosis not present

## 2019-03-08 DIAGNOSIS — F0781 Postconcussional syndrome: Secondary | ICD-10-CM | POA: Insufficient documentation

## 2019-03-08 DIAGNOSIS — R519 Headache, unspecified: Secondary | ICD-10-CM | POA: Insufficient documentation

## 2019-03-08 DIAGNOSIS — R42 Dizziness and giddiness: Secondary | ICD-10-CM | POA: Insufficient documentation

## 2019-03-08 DIAGNOSIS — R51 Headache: Secondary | ICD-10-CM | POA: Diagnosis not present

## 2019-03-24 ENCOUNTER — Ambulatory Visit
Admission: RE | Admit: 2019-03-24 | Discharge: 2019-03-24 | Disposition: A | Discharge status: Home / Self Care | Payer: Medicare Other | Source: Ambulatory Visit | Attending: Family Medicine | Admitting: Family Medicine

## 2019-03-24 ENCOUNTER — Other Ambulatory Visit: Payer: Self-pay

## 2019-03-24 DIAGNOSIS — Z1231 Encounter for screening mammogram for malignant neoplasm of breast: Secondary | ICD-10-CM | POA: Diagnosis not present

## 2019-03-24 DIAGNOSIS — E2839 Other primary ovarian failure: Secondary | ICD-10-CM | POA: Insufficient documentation

## 2019-03-24 DIAGNOSIS — M810 Age-related osteoporosis without current pathological fracture: Secondary | ICD-10-CM | POA: Diagnosis not present

## 2019-03-25 DIAGNOSIS — L988 Other specified disorders of the skin and subcutaneous tissue: Secondary | ICD-10-CM | POA: Diagnosis not present

## 2019-03-25 DIAGNOSIS — L821 Other seborrheic keratosis: Secondary | ICD-10-CM | POA: Diagnosis not present

## 2019-04-01 ENCOUNTER — Ambulatory Visit (INDEPENDENT_AMBULATORY_CARE_PROVIDER_SITE_OTHER): Payer: Medicare Other | Admitting: Family Medicine

## 2019-04-01 ENCOUNTER — Encounter: Payer: Self-pay | Admitting: Family Medicine

## 2019-04-01 ENCOUNTER — Other Ambulatory Visit: Payer: Self-pay

## 2019-04-01 VITALS — BP 103/64 | HR 69 | Temp 98.2°F | Resp 16 | Ht 63.0 in | Wt 114.0 lb

## 2019-04-01 DIAGNOSIS — F0781 Postconcussional syndrome: Secondary | ICD-10-CM | POA: Diagnosis not present

## 2019-04-01 DIAGNOSIS — E2839 Other primary ovarian failure: Secondary | ICD-10-CM

## 2019-04-01 DIAGNOSIS — Z23 Encounter for immunization: Secondary | ICD-10-CM | POA: Diagnosis not present

## 2019-04-01 DIAGNOSIS — K219 Gastro-esophageal reflux disease without esophagitis: Secondary | ICD-10-CM | POA: Diagnosis not present

## 2019-04-01 DIAGNOSIS — E785 Hyperlipidemia, unspecified: Secondary | ICD-10-CM

## 2019-04-01 NOTE — Progress Notes (Signed)
Patient: Alexis Medina, Female    DOB: [birth year], 73 y.o.   MRN: [id number] Visit Date: 04/01/2019  Today's Provider: [name], MD   Chief Complaint  Patient presents with  . Annual Exam   Subjective:   Patient had AWV on 12/2018.    Annual recheck of medical issues Alexis Medina is a 73 y.o. female who presents today for health maintenance . She feels well. She reports exercising daily. She reports she is sleeping well. She has suffered a recent puncture wound to her thumb. She has recovered from her concussion. She no longer works and she is pleased about this. She has significant emotional distress and irritability when she tries to stop her HRT  Colonoscopy- 06/10/2013. Hyperplastic polyps. Repeat 10 years.  Mammogram- 03/24/2019. Normal. Repeat 1 year. BMD- 03/24/2019. Mild osteoporosis.    Review of Systems  Constitutional: Negative.   HENT: Negative.   Eyes: Negative.   Respiratory: Negative.   Cardiovascular: Negative.   Gastrointestinal: Negative.   Endocrine: Negative.   Genitourinary: Negative.  Negative for enuresis.  Musculoskeletal: Positive for back pain.  Skin: Negative.   Allergic/Immunologic: Negative.   Neurological: Positive for headaches.  Hematological: Negative.   Psychiatric/Behavioral: Negative.     Social History      She  reports that she has never smoked. She has never used smokeless tobacco. She reports that she does not drink alcohol or use drugs.       Social History   Socioeconomic History  . Marital status: Married    Spouse name: Not on file  . Number of children: 1  . Years of education: Not on file  . Highest education level: Bachelor's degree (e.g., BA, AB, BS)  Occupational History  . 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 Financial resource strain: Not hard at all  . Food insecurity    Worry: Never true    Inability: Never true  .  Transportation needs    Medical: No    Non-medical: No  Tobacco Use  . Smoking status: Never Smoker  . Smokeless tobacco: Never Used  Substance and Sexual Activity  . Alcohol use: No    Frequency: Never  . Drug use: No  . Sexual activity: Yes    Birth control/protection: None, Surgical  Lifestyle  . Physical activity    Days per week: 0 days    Minutes per session: 0 min  . Stress: Only a little  Relationships  . Social Herbalist on phone: Patient refused    Gets together: Patient refused    Attends religious service: Patient refused    Active member of club or organization: Patient refused    Attends meetings of clubs or organizations: Patient refused    Relationship status: Patient refused  Other Topics Concern  . Not on file  Social History Narrative  . Not on file    Past Medical History:  Diagnosis Date  . Concussion   . Hormone replacement therapy   . Hyperlipidemia   . Osteopenia      Patient Active Problem List   Diagnosis Date Noted  . DDD (degenerative disc disease), 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/02/2018  . Menopausal syndrome on hormone replacement therapy 04/18/2015  . History of postmenopausal bleeding 04/18/2015    Past Surgical History:  Procedure Laterality Date  . TONSILLECTOMY    . TUBAL  LIGATION      Family History        Family Status  Relation Name Status  . Father  Deceased  . Mother  Deceased  . Brother  Deceased       died from massive heart attack  . Brother  Deceased       died from car wreck  . Cousin  (Not Specified)  . Son  Alive        Her family history includes Alzheimer's disease in her mother; Breast cancer in her cousin; Healthy in her son; Heart disease in her brother, father, and mother; Hypertension in her brother.      No Known Allergies   Current Outpatient Medications:  .  alendronate (FOSAMAX) 70 MG tablet, TAKE 1 TABLET EVERY 7 DAYS WITH A FULL GLASS OF WATER ON AN EMPTY STOMACH, Disp: 12 tablet, Rfl: 3 .   celecoxib (CELEBREX) 100 MG capsule, Take 1 capsule (100 mg total) by mouth daily. (Patient taking differently: Take 100 mg by mouth daily. As needed), Disp: 90 capsule, Rfl: 3 .  cholecalciferol (VITAMIN D) 1000 UNITS tablet, Take 1,000 Units by mouth daily., Disp: , Rfl:  .  Docosahexaenoic Acid (DHA OMEGA 3) 100 MG CAPS, TAKE 2 EACH BY MOUTH DAILY., Disp: 60 capsule, Rfl: 12 .  famotidine (PEPCID) 40 MG tablet, Take 1 tablet (40 mg total) by mouth 2 (two) times daily., Disp: 60 tablet, Rfl: 5 .  ibuprofen (ADVIL,MOTRIN) 800 MG tablet, Take 1 tablet (800 mg total) by mouth every 8 (eight) hours as needed., Disp: 90 tablet, Rfl: 3 .  Multiple Vitamin (MULTIVITAMIN) capsule, Take 1 capsule by mouth daily., Disp: , Rfl:  .  PREMPRO 0.3-1.5 MG tablet, TAKE 1 TABLET DAILY, Disp: 84 tablet, Rfl: 4 .  tazarotene (AVAGE) 0.1 % cream, For acne, Disp: , Rfl:  .  traMADol (ULTRAM) 50 MG tablet, Take 1 tablet (50 mg total) by mouth 2 (two) times daily as needed., Disp: 135 tablet, Rfl: 5   Patient Care Team: Jerrol Banana., MD as PCP - General (Family Medicine) Shon Hough, MD as Consulting Physician (Ophthalmology) Rubie Maid, MD as Referring Physician (Obstetrics and Gynecology) Jannifer Franklin, NP as Nurse Practitioner (Neurology) Christene Slates, MD (Dermatology)    Objective:    Vitals: BP 103/64   Pulse 69   Temp 98.2 F (36.8 C)   Resp 16   Ht 5\' 3"  (1.6 m)   Wt 114 lb (51.7 kg)   BMI 20.19 kg/m    Vitals:   04/01/19 1431  BP: 103/64  Pulse: 69  Resp: 16  Temp: 98.2 F (36.8 C)  Weight: 114 lb (51.7 kg)  Height: 5\' 3"  (1.6 m)     Physical Exam   Depression Screen PHQ 2/9 Scores 02/25/2019 01/13/2019 12/01/2018 10/26/2018  PHQ - 2 Score 0 0 2 0       Assessment & Plan:     Routine Health Maintenance   Exercise Activities and Dietary recommendations Goals    . DIET - INCREASE WATER INTAKE     Recommend increasing water intake to 4-6 glasses a day.      . Increase water intake     Recommend increasing water intake to 4-5 glasses a day.       Immunization History  Administered Date(s) Administered  . Hepatitis A 04/09/2006  . Influenza, High Dose Seasonal PF 08/19/2015, 07/30/2016, 07/17/2017  . Pneumococcal Conjugate-13 01/02/2017  . Pneumococcal Polysaccharide-23 08/10/2013  .  Yellow Fever 04/09/2006    Health Maintenance  Topic Date Due  . TETANUS/TDAP  05/29/1965  . INFLUENZA VACCINE  05/22/2019  . MAMMOGRAM  03/23/2021  . DEXA SCAN  03/23/2021  . COLONOSCOPY  06/11/2023  . Hepatitis C Screening  Completed  . PNA vac Low Risk Adult  Completed     Discussed health benefits of physical activity, and encouraged her to engage in regular exercise appropriate for her age and condition.  1. Hyperlipidemia, unspecified hyperlipidemia type Check lipids today - Comprehensive metabolic panel - Lipid panel  2. Estrogen deficiency Patient says she cannot tolerate being off of HRT.  She will discuss with gynecology.  She is having no vaginal bleeding.  I think it is fine to continue same for the time being. - TSH  3. Post concussion syndrome Resolved.  4. Gastroesophageal reflux disease without esophagitis  - CBC with Differential/Platelet 5.Thumb puncture wound No infection Tetanus updated.   I have done the exam and reviewed the above chart and it is accurate to the best of my knowledge. Development worker, community has been used in this note in any air is in the dictation or transcription are unintentional.  [name], MD  [place]

## 2019-04-02 ENCOUNTER — Telehealth: Payer: Self-pay

## 2019-04-02 LAB — COMPREHENSIVE METABOLIC PANEL
ALT: 12 IU/L (ref 0–32)
AST: 21 IU/L (ref 0–40)
Albumin/Globulin Ratio: 1.7 (ref 1.2–2.2)
Albumin: 4.6 g/dL (ref 3.7–4.7)
Alkaline Phosphatase: 40 IU/L (ref 39–117)
BUN/Creatinine Ratio: 15 (ref 12–28)
BUN: 12 mg/dL (ref 8–27)
Bilirubin Total: 0.6 mg/dL (ref 0.0–1.2)
CO2: 22 mmol/L (ref 20–29)
Calcium: 9.8 mg/dL (ref 8.7–10.3)
Chloride: 98 mmol/L (ref 96–106)
Creatinine, Ser: 0.79 mg/dL (ref 0.57–1.00)
GFR calc Af Amer: 86 mL/min/{1.73_m2} (ref >59)
GFR calc non Af Amer: 75 mL/min/{1.73_m2} (ref >59)
Globulin, Total: 2.7 g/dL (ref 1.5–4.5)
Glucose: 66 mg/dL (ref 65–99)
Potassium: 4.3 mmol/L (ref 3.5–5.2)
Sodium: 135 mmol/L (ref 134–144)
Total Protein: 7.3 g/dL (ref 6.0–8.5)

## 2019-04-02 LAB — CBC WITH DIFFERENTIAL/PLATELET
Basophils Absolute: 0.1 10*3/uL (ref 0.0–0.2)
Basos: 1 %
EOS (ABSOLUTE): 0.1 10*3/uL (ref 0.0–0.4)
Eos: 1 %
Hematocrit: 35.7 % (ref 34.0–46.6)
Hemoglobin: 12.4 g/dL (ref 11.1–15.9)
Immature Grans (Abs): 0 10*3/uL (ref 0.0–0.1)
Immature Granulocytes: 0 %
Lymphocytes Absolute: 3.3 10*3/uL — ABNORMAL HIGH (ref 0.7–3.1)
Lymphs: 38 %
MCH: 30.8 pg (ref 26.6–33.0)
MCHC: 34.7 g/dL (ref 31.5–35.7)
MCV: 89 fL (ref 79–97)
Monocytes Absolute: 0.6 10*3/uL (ref 0.1–0.9)
Monocytes: 7 %
Neutrophils Absolute: 4.6 10*3/uL (ref 1.4–7.0)
Neutrophils: 53 %
Platelets: 220 10*3/uL (ref 150–450)
RBC: 4.02 x10E6/uL (ref 3.77–5.28)
RDW: 12 % (ref 11.7–15.4)
WBC: 8.7 10*3/uL (ref 3.4–10.8)

## 2019-04-02 LAB — LIPID PANEL
Chol/HDL Ratio: 2.2 ratio (ref 0.0–4.4)
Cholesterol, Total: 209 mg/dL — ABNORMAL HIGH (ref 100–199)
HDL: 95 mg/dL (ref >39)
LDL Calculated: 103 mg/dL — ABNORMAL HIGH (ref 0–99)
Triglycerides: 54 mg/dL (ref 0–149)
VLDL Cholesterol Cal: 11 mg/dL (ref 5–40)

## 2019-04-02 LAB — TSH: TSH: 0.995 u[IU]/mL (ref 0.450–4.500)

## 2019-04-02 NOTE — Telephone Encounter (Signed)
Patient notified of dexascan results.

## 2019-04-02 NOTE — Telephone Encounter (Signed)
-----   Message from Jerrol Banana., MD sent at 03/31/2019 12:09 PM EDT ----- Mild osteoporosis.  Stay on Fosamax.  Repeat BMD 2 years

## 2019-04-05 ENCOUNTER — Telehealth: Payer: Self-pay

## 2019-04-05 NOTE — Telephone Encounter (Signed)
Patient was advised. KW 

## 2019-04-05 NOTE — Telephone Encounter (Signed)
-----   Message from Jerrol Banana., MD sent at 04/05/2019  8:19 AM EDT ----- Labs good.

## 2019-04-12 ENCOUNTER — Encounter: Payer: Medicare Other | Admitting: Family Medicine

## 2019-04-15 ENCOUNTER — Telehealth: Payer: Self-pay | Admitting: Family Medicine

## 2019-04-15 DIAGNOSIS — Z20822 Contact with and (suspected) exposure to covid-19: Secondary | ICD-10-CM

## 2019-04-15 NOTE — Telephone Encounter (Signed)
Another message sent in regards to this. Please review.

## 2019-04-15 NOTE — Telephone Encounter (Signed)
The lady was in direct contact with daughter for about 3 days. The daughter had just came back from the beach. The patient had went over to visit the lady and stayed over for about 55mins. The patient and husband would like to get tested for covid. Will place order. They are aware that it may be tomorrow before they hear from [facility], and advised to quarantine until appt.   Please order for exposure. Thanks!

## 2019-04-15 NOTE — Telephone Encounter (Signed)
Pt was in same room yesterday with a lady whose daughter tested positive for Covid.  The daughter had no smell or taste.  The lady has been feeling bad lately, but hasn't been tested.  Pt and husband do not have symptoms currently.  Pt and husband needing to know what do they need to do?  Please advise, [place]

## 2019-04-15 NOTE — Telephone Encounter (Signed)
Did the mother have direct exposure to daughter? If so can quarantine for 2 weeks or we can order a covid test for [place].If mother was not directly exposed would not order test.

## 2019-04-16 NOTE — Telephone Encounter (Signed)
Scheduled patient for testing on Monday 04/19/2019 per her request.  Testing protocol reviewed.

## 2019-04-19 ENCOUNTER — Other Ambulatory Visit: Payer: Medicare Other

## 2019-04-19 DIAGNOSIS — R6889 Other general symptoms and signs: Secondary | ICD-10-CM | POA: Diagnosis not present

## 2019-04-24 LAB — NOVEL CORONAVIRUS, NAA: SARS-CoV-2, NAA: NOT DETECTED

## 2019-05-06 ENCOUNTER — Other Ambulatory Visit: Payer: Self-pay

## 2019-05-06 DIAGNOSIS — N951 Menopausal and female climacteric states: Secondary | ICD-10-CM

## 2019-05-06 DIAGNOSIS — Z79890 Hormone replacement therapy (postmenopausal): Secondary | ICD-10-CM

## 2019-05-06 MED ORDER — PREMPRO 0.3-1.5 MG PO TABS: 1.0000 | ORAL_TABLET | Freq: Every day | ORAL | 3 refills | Status: DC

## 2019-05-20 DIAGNOSIS — H04121 Dry eye syndrome of right lacrimal gland: Secondary | ICD-10-CM | POA: Diagnosis not present

## 2019-05-20 DIAGNOSIS — H04122 Dry eye syndrome of left lacrimal gland: Secondary | ICD-10-CM | POA: Diagnosis not present

## 2019-07-07 ENCOUNTER — Ambulatory Visit (INDEPENDENT_AMBULATORY_CARE_PROVIDER_SITE_OTHER): Payer: Medicare Other

## 2019-07-07 ENCOUNTER — Other Ambulatory Visit: Payer: Self-pay

## 2019-07-07 DIAGNOSIS — Z23 Encounter for immunization: Secondary | ICD-10-CM

## 2019-07-23 ENCOUNTER — Other Ambulatory Visit: Payer: Self-pay | Admitting: Family Medicine

## 2019-07-23 DIAGNOSIS — K219 Gastro-esophageal reflux disease without esophagitis: Secondary | ICD-10-CM

## 2019-07-23 MED ORDER — FAMOTIDINE 40 MG PO TABS: 40.0000 mg | ORAL_TABLET | Freq: Two times a day (BID) | ORAL | 5 refills | Status: DC

## 2019-07-23 NOTE — Telephone Encounter (Signed)
Pt needing a emergency refill on famotidine (PEPCID) 40 MG tablet.  The mail order pharmacy cannot get her refills done and sent to her quickly.  She needs the refill sent to: [place], [place] - [address] [phone] (Phone) [phone] (Fax)   Pt wanting a call back if this can be done asap.  Thanks, [employer]

## 2019-07-23 NOTE — Telephone Encounter (Signed)
Sent to [facility]

## 2019-07-24 ENCOUNTER — Other Ambulatory Visit: Payer: Self-pay | Admitting: Family Medicine

## 2019-07-24 DIAGNOSIS — K219 Gastro-esophageal reflux disease without esophagitis: Secondary | ICD-10-CM

## 2019-08-12 DIAGNOSIS — L814 Other melanin hyperpigmentation: Secondary | ICD-10-CM | POA: Diagnosis not present

## 2019-08-12 DIAGNOSIS — L570 Actinic keratosis: Secondary | ICD-10-CM | POA: Diagnosis not present

## 2019-08-12 DIAGNOSIS — L988 Other specified disorders of the skin and subcutaneous tissue: Secondary | ICD-10-CM | POA: Diagnosis not present

## 2019-09-01 DIAGNOSIS — E611 Iron deficiency: Secondary | ICD-10-CM | POA: Diagnosis not present

## 2019-09-01 DIAGNOSIS — E559 Vitamin D deficiency, unspecified: Secondary | ICD-10-CM | POA: Diagnosis not present

## 2019-09-01 DIAGNOSIS — E538 Deficiency of other specified B group vitamins: Secondary | ICD-10-CM | POA: Diagnosis not present

## 2019-09-01 DIAGNOSIS — F0781 Postconcussional syndrome: Secondary | ICD-10-CM | POA: Diagnosis not present

## 2019-09-01 DIAGNOSIS — R42 Dizziness and giddiness: Secondary | ICD-10-CM | POA: Diagnosis not present

## 2019-09-01 DIAGNOSIS — R519 Headache, unspecified: Secondary | ICD-10-CM | POA: Diagnosis not present

## 2019-09-01 DIAGNOSIS — E519 Thiamine deficiency, unspecified: Secondary | ICD-10-CM | POA: Diagnosis not present

## 2019-09-06 ENCOUNTER — Other Ambulatory Visit: Payer: Self-pay

## 2019-09-06 DIAGNOSIS — Z20822 Contact with and (suspected) exposure to covid-19: Secondary | ICD-10-CM

## 2019-09-06 DIAGNOSIS — Z20828 Contact with and (suspected) exposure to other viral communicable diseases: Secondary | ICD-10-CM | POA: Diagnosis not present

## 2019-09-08 LAB — NOVEL CORONAVIRUS, NAA: SARS-CoV-2, NAA: NOT DETECTED

## 2019-10-04 NOTE — Progress Notes (Signed)
Patient: Alexis Medina Female    DOB: [birth year]   73 y.o.   MRN: [id number] Visit Date: 10/05/2019  Today's Provider: [name], MD   Chief Complaint  Patient presents with  . Follow-up  . Hyperlipidemia  . Gastroesophageal Reflux   Subjective:     HPI  Patient has no complaints, she feels well. Hyperlipidemia, unspecified hyperlipidemia type From 04/01/2019-labs good.  Estrogen deficiency From 04/01/2019-labs good.  Gastroesophageal reflux disease without esophagitis From 04/01/2019-labs good.  No Known Allergies   Current Outpatient Medications:  .  alendronate (FOSAMAX) 70 MG tablet, TAKE 1 TABLET EVERY 7 DAYS WITH A FULL GLASS OF WATER ON AN EMPTY STOMACH, Disp: 12 tablet, Rfl: 3 .  cholecalciferol (VITAMIN D) 1000 UNITS tablet, Take 1,000 Units by mouth daily., Disp: , Rfl:  .  Docosahexaenoic Acid (DHA OMEGA 3) 100 MG CAPS, TAKE 2 EACH BY MOUTH DAILY., Disp: 60 capsule, Rfl: 12 .  estrogen, conjugated,-medroxyprogesterone (PREMPRO) 0.3-1.5 MG tablet, Take 1 tablet by mouth daily., Disp: 90 tablet, Rfl: 3 .  famotidine (PEPCID) 40 MG tablet, TAKE 1 TABLET TWICE A DAY, Disp: 180 tablet, Rfl: 3 .  ibuprofen (ADVIL,MOTRIN) 800 MG tablet, Take 1 tablet (800 mg total) by mouth every 8 (eight) hours as needed., Disp: 90 tablet, Rfl: 3 .  Multiple Vitamin (MULTIVITAMIN) capsule, Take 1 capsule by mouth daily., Disp: , Rfl:  .  tazarotene (AVAGE) 0.1 % cream, For acne, Disp: , Rfl:  .  traMADol (ULTRAM) 50 MG tablet, Take 1 tablet (50 mg total) by mouth 2 (two) times daily as needed., Disp: 135 tablet, Rfl: 5 .  celecoxib (CELEBREX) 100 MG capsule, Take 1 capsule (100 mg total) by mouth daily. (Patient not taking: Reported on 10/05/2019), Disp: 90 capsule, Rfl: 3  Review of Systems  Constitutional: Negative for appetite change, chills, fatigue and fever.  HENT: Negative.   Eyes: Negative.   Respiratory: Negative for chest tightness and shortness of breath.    Cardiovascular: Negative for chest pain and palpitations.  Gastrointestinal: Negative for abdominal pain, nausea and vomiting.  Endocrine: Negative.   Allergic/Immunologic: Negative.   Neurological: Negative for dizziness and weakness.  Psychiatric/Behavioral: Negative.     Social History   Tobacco Use  . Smoking status: Never Smoker  . Smokeless tobacco: Never Used  Substance Use Topics  . Alcohol use: No      Objective:   BP 112/69 (BP Location: Left Arm, Patient Position: Sitting, Cuff Size: Normal)   Pulse 62   Temp (!) 97.1 F (36.2 C) (Other (Comment))   Resp 18   Ht 5\' 3"  (1.6 m)   Wt 114 lb (51.7 kg)   SpO2 97%   BMI 20.19 kg/m  Vitals:   10/05/19 0941  BP: 112/69  Pulse: 62  Resp: 18  Temp: (!) 97.1 F (36.2 C)  TempSrc: Other (Comment)  SpO2: 97%  Weight: 114 lb (51.7 kg)  Height: 5\' 3"  (1.6 m)  Body mass index is 20.19 kg/m.   Physical Exam Vitals reviewed.  Constitutional:      Appearance: Normal appearance.  HENT:     Right Ear: External ear normal.     Left Ear: External ear normal.     Nose: Nose normal.     Mouth/Throat:     Pharynx: Oropharynx is clear.  Eyes:     Conjunctiva/sclera: Conjunctivae normal.  Cardiovascular:     Rate and Rhythm: Normal rate.  Heart sounds: Normal heart sounds.  Pulmonary:     Effort: Pulmonary effort is normal.  Abdominal:     Palpations: Abdomen is soft.  Musculoskeletal:     Cervical back: No muscular tenderness.  Neurological:     General: No focal deficit present.     Mental Status: She is alert and oriented to person, place, and time. Mental status is at baseline.     Cranial Nerves: No cranial nerve deficit.     Motor: No weakness.     Coordination: Coordination normal.     Gait: Gait normal.  Psychiatric:        Mood and Affect: Mood normal.        Thought Content: Thought content normal.        Judgment: Judgment normal.      No results found for any visits on 10/05/19.      Assessment & Plan    1. DDD (degenerative disc disease), lumbosacral Encourage infrequent use of tramadol which she is doing. - traMADol (ULTRAM) 50 MG tablet; Take 1 tablet (50 mg total) by mouth 2 (two) times daily as needed.  Dispense: 135 tablet; Refill: 5  2. Age-related osteoporosis without current pathological fracture On alendronate.     Richard Cranford Mon, MD  [facility]

## 2019-10-05 ENCOUNTER — Other Ambulatory Visit: Payer: Self-pay

## 2019-10-05 ENCOUNTER — Ambulatory Visit (INDEPENDENT_AMBULATORY_CARE_PROVIDER_SITE_OTHER): Payer: Medicare Other | Admitting: Family Medicine

## 2019-10-05 ENCOUNTER — Encounter: Payer: Self-pay | Admitting: Family Medicine

## 2019-10-05 VITALS — BP 112/69 | HR 62 | Temp 97.1°F | Resp 18 | Ht 63.0 in | Wt 114.0 lb

## 2019-10-05 DIAGNOSIS — M810 Age-related osteoporosis without current pathological fracture: Secondary | ICD-10-CM

## 2019-10-05 DIAGNOSIS — M5137 Other intervertebral disc degeneration, lumbosacral region: Secondary | ICD-10-CM | POA: Diagnosis not present

## 2019-10-12 MED ORDER — TRAMADOL HCL 50 MG PO TABS: 50.0000 mg | ORAL_TABLET | Freq: Two times a day (BID) | ORAL | 5 refills | Status: DC | PRN

## 2019-10-28 DIAGNOSIS — L570 Actinic keratosis: Secondary | ICD-10-CM | POA: Diagnosis not present

## 2019-10-28 DIAGNOSIS — L568 Other specified acute skin changes due to ultraviolet radiation: Secondary | ICD-10-CM | POA: Diagnosis not present

## 2019-11-15 ENCOUNTER — Ambulatory Visit: Payer: Medicare Other | Attending: Internal Medicine

## 2019-11-15 ENCOUNTER — Ambulatory Visit: Payer: Medicare Other

## 2019-11-15 DIAGNOSIS — Z20822 Contact with and (suspected) exposure to covid-19: Secondary | ICD-10-CM | POA: Diagnosis not present

## 2019-11-16 ENCOUNTER — Ambulatory Visit: Payer: Medicare Other

## 2019-11-16 LAB — NOVEL CORONAVIRUS, NAA: SARS-CoV-2, NAA: NOT DETECTED

## 2019-11-17 ENCOUNTER — Telehealth: Payer: Self-pay

## 2019-11-17 NOTE — Telephone Encounter (Signed)
Please advise 

## 2019-11-17 NOTE — Telephone Encounter (Signed)
Copied from [place] [id number]. Topic: Appointment Scheduling - Scheduling Inquiry for Clinic >> Nov 16, 2019  4:52 PM Mathis Bud wrote: Reason for CRM: Patient is request to come into the office.  Patient does have a cough and fever.  Patient did have covid test done and it was negative.  Patient would like PCP to listen to her lungs.  She wants to come into the office not have virtual.  [phone]

## 2019-11-18 ENCOUNTER — Encounter: Payer: Self-pay | Admitting: Family Medicine

## 2019-11-18 ENCOUNTER — Ambulatory Visit: Payer: Medicare Other | Attending: Internal Medicine

## 2019-11-18 ENCOUNTER — Telehealth (INDEPENDENT_AMBULATORY_CARE_PROVIDER_SITE_OTHER): Payer: Medicare Other | Admitting: Family Medicine

## 2019-11-18 DIAGNOSIS — Z20822 Contact with and (suspected) exposure to covid-19: Secondary | ICD-10-CM

## 2019-11-18 DIAGNOSIS — R509 Fever, unspecified: Secondary | ICD-10-CM | POA: Diagnosis not present

## 2019-11-18 DIAGNOSIS — J410 Simple chronic bronchitis: Secondary | ICD-10-CM | POA: Diagnosis not present

## 2019-11-18 DIAGNOSIS — R059 Cough, unspecified: Secondary | ICD-10-CM

## 2019-11-18 DIAGNOSIS — R05 Cough: Secondary | ICD-10-CM | POA: Diagnosis not present

## 2019-11-18 MED ORDER — DOXYCYCLINE HYCLATE 100 MG PO TABS: 100.0000 mg | ORAL_TABLET | Freq: Two times a day (BID) | ORAL | 0 refills | Status: DC

## 2019-11-18 NOTE — Progress Notes (Signed)
.       Patient: Alexis Medina Female    DOB: 1946/09/14   74 y.o.   MRN: [id number] Visit Date: 11/18/2019  Today's Provider: [name], MD   Chief Complaint  Patient presents with  . Cough  . Fever   Subjective:    Virtual Visit via Video Note  I connected with Alexis Medina on 11/18/19 at  9:40 AM EST by a video enabled telemedicine application and verified that I am speaking with the correct person using two identifiers.  Location: Patient: Home Provider: Office   I discussed the limitations of evaluation and management by telemedicine and the availability of in person appointments. The patient expressed understanding and agreed to proceed.   HPI Patient has had a nonproductive cough for the past week with pleuritic chest pain.  Her O2 sats been good about 97%.  She did have a T-max yesterday of 101.  She had a negative Covid test from 125 and she has had no known exposure and her husband is well.  She does have myalgias.  No hemoptysis and no production with her cough. No Known Allergies   Current Outpatient Medications:  .  alendronate (FOSAMAX) 70 MG tablet, TAKE 1 TABLET EVERY 7 DAYS WITH A FULL GLASS OF WATER ON AN EMPTY STOMACH, Disp: 12 tablet, Rfl: 3 .  cholecalciferol (VITAMIN D) 1000 UNITS tablet, Take 1,000 Units by mouth daily., Disp: , Rfl:  .  Docosahexaenoic Acid (DHA OMEGA 3) 100 MG CAPS, TAKE 2 EACH BY MOUTH DAILY., Disp: 60 capsule, Rfl: 12 .  estrogen, conjugated,-medroxyprogesterone (PREMPRO) 0.3-1.5 MG tablet, Take 1 tablet by mouth daily., Disp: 90 tablet, Rfl: 3 .  famotidine (PEPCID) 40 MG tablet, TAKE 1 TABLET TWICE A DAY, Disp: 180 tablet, Rfl: 3 .  ibuprofen (ADVIL,MOTRIN) 800 MG tablet, Take 1 tablet (800 mg total) by mouth every 8 (eight) hours as needed., Disp: 90 tablet, Rfl: 3 .  Multiple Vitamin (MULTIVITAMIN) capsule, Take 1 capsule by mouth daily., Disp: , Rfl:  .  tazarotene (AVAGE) 0.1 % cream, For acne, Disp: , Rfl:  .   traMADol (ULTRAM) 50 MG tablet, Take 1 tablet (50 mg total) by mouth 2 (two) times daily as needed., Disp: 135 tablet, Rfl: 5 .  celecoxib (CELEBREX) 100 MG capsule, Take 1 capsule (100 mg total) by mouth daily. (Patient not taking: Reported on 10/05/2019), Disp: 90 capsule, Rfl: 3  Review of Systems  Constitutional: Positive for fever. Negative for appetite change, chills and fatigue.  Respiratory: Positive for cough. Negative for chest tightness and shortness of breath.   Cardiovascular: Negative for chest pain and palpitations.  Gastrointestinal: Negative for abdominal pain, nausea and vomiting.  Neurological: Negative for dizziness and weakness.    Social History   Tobacco Use  . Smoking status: Never Smoker  . Smokeless tobacco: Never Used  Substance Use Topics  . Alcohol use: No      Objective:   There were no vitals taken for this visit. There were no vitals filed for this visit.There is no height or weight on file to calculate BMI.   Physical Exam   No results found for any visits on 11/18/19.     Assessment & Plan     1. Simple chronic bronchitis (HCC With negative Covid test will treat as bronchitis.  May need chest x-ray but she looks clinically to be good today.  She is in no respiratory distress at all.  Cover with doxycycline -  doxycycline (VIBRA-TABS) 100 MG tablet; Take 1 tablet (100 mg total) by mouth 2 (two) times daily.  Dispense: 10 tablet; Refill: 0  2. Cough Due to explosive Covid pandemic we will repeat Covid test today or tomorrow  3. Fever, unspecified fever cause Could be flu but I doubt it.  As long as she is does not worsen will cover with Doxy and push fluids and Tylenol  I discussed the assessment and treatment plan with the patient. The patient was provided an opportunity to ask questions and all were answered. The patient agreed with the plan and demonstrated an understanding of the instructions.   The patient was advised to call back or  seek an in-person evaluation if the symptoms worsen or if the condition fails to improve as anticipated.  I provided 12 minutes of non-face-to-face time during this encounter.    Richard Cranford Mon, MD  [facility]

## 2019-11-19 LAB — NOVEL CORONAVIRUS, NAA: SARS-CoV-2, NAA: NOT DETECTED

## 2019-11-22 DIAGNOSIS — H16103 Unspecified superficial keratitis, bilateral: Secondary | ICD-10-CM | POA: Diagnosis not present

## 2019-11-22 DIAGNOSIS — H04123 Dry eye syndrome of bilateral lacrimal glands: Secondary | ICD-10-CM | POA: Diagnosis not present

## 2019-11-22 DIAGNOSIS — H35372 Puckering of macula, left eye: Secondary | ICD-10-CM | POA: Diagnosis not present

## 2019-11-25 ENCOUNTER — Ambulatory Visit: Payer: Medicare Other | Attending: Internal Medicine

## 2019-11-25 DIAGNOSIS — Z23 Encounter for immunization: Secondary | ICD-10-CM

## 2019-11-25 NOTE — Progress Notes (Signed)
   Covid-19 Vaccination Clinic  Name:  Alexis Medina    MRN: [id number] DOB: 10-08-1946  11/25/2019  [name] was observed post Covid-19 immunization for 15 minutes without incidence. She was provided with Vaccine Information Sheet and instruction to access the V-Safe system.   [name] was instructed to call 911 with any severe reactions post vaccine: Marland Kitchen Difficulty breathing  . Swelling of your face and throat  . A fast heartbeat  . A bad rash all over your body  . Dizziness and weakness    Immunizations Administered    Name Date Dose VIS Date Route   Pfizer COVID-19 Vaccine 11/25/2019  4:56 PM 0.3 mL 10/01/2019 Intramuscular   Manufacturer: [place]   Lot: [id number]   [place]: [id number]

## 2019-12-21 ENCOUNTER — Ambulatory Visit: Payer: Medicare Other | Attending: Internal Medicine

## 2019-12-21 DIAGNOSIS — Z23 Encounter for immunization: Secondary | ICD-10-CM | POA: Insufficient documentation

## 2019-12-21 NOTE — Progress Notes (Signed)
   Covid-19 Vaccination Clinic  Name:  TYKEYA ANTHIS    MRN: [id number] DOB: [birth year]  12/21/2019  Ms. Flanagin was observed post Covid-19 immunization for 15 minutes without incident. She was provided with Vaccine Information Sheet and instruction to access the V-Safe system.   Ms. Freilich was instructed to call 911 with any severe reactions post vaccine: Marland Kitchen Difficulty breathing  . Swelling of face and throat  . A fast heartbeat  . A bad rash all over body  . Dizziness and weakness   Immunizations Administered    Name Date Dose VIS Date Route   Pfizer COVID-19 Vaccine 12/21/2019  9:40 AM 0.3 mL 10/01/2019 Intramuscular   Manufacturer: [place]   Lot: [id number]   [place]: [id number]

## 2020-01-13 NOTE — Progress Notes (Signed)
Subjective:   Alexis Medina is a 74 y.o. female who presents for Medicare Annual (Subsequent) preventive examination.    This visit is being conducted through telemedicine due to the COVID-19 pandemic. This patient has given me verbal consent via doximity to conduct this visit, patient states they are participating from their home address. Some vital signs may be absent or patient reported.    Patient identification: identified by name, DOB, and current address  Review of Systems:  N/A  Cardiac Risk Factors include: advanced age (>39men, >8 women)     Objective:     Vitals: BP 105/73 (BP Location: Left Arm)   Pulse 63   Temp 97.7 F (36.5 C) (Oral)   Ht 5\' 3"  (1.6 m)   Wt 114 lb (51.7 kg)   SpO2 97%   BMI 20.19 kg/m   Body mass index is 20.19 kg/m. Pt recorded vitals at home prior to telephonic visit.   Advanced Directives 01/17/2020 01/13/2019 08/25/2018 01/09/2018 01/02/2017 08/07/2016  Does Patient Have a Medical Advance Directive? Yes Yes Yes Yes Yes No  Type of Paramedic of [place];Living will [place];Living will [place];Living will [place];Living will Living will;Healthcare Power of Attorney -  Copy of [place] in Chart? No - copy requested No - copy requested - No - copy requested No - copy requested -    Tobacco Social History   Tobacco Use  Smoking Status Never Smoker  Smokeless Tobacco Never Used     Counseling given: Not Answered   Clinical Intake:  Pre-visit preparation completed: Yes  Pain : No/denies pain Pain Score: 0-No pain     Nutritional Risks: None Diabetes: No  How often do you need to have someone help you when you read instructions, pamphlets, or other written materials from your doctor or pharmacy?: 1 - Never  Interpreter Needed?: No  Information entered by :: [facility], LPN  Past Medical History:  Diagnosis Date  .  Concussion   . Hormone replacement therapy   . Hyperlipidemia   . Osteopenia    Past Surgical History:  Procedure Laterality Date  . TONSILLECTOMY    . TUBAL LIGATION     Family History  Problem Relation Age of Onset  . Heart disease Father   . Alzheimer's disease Mother   . Heart disease Mother   . Heart disease Brother   . Hypertension Brother   . Breast cancer Cousin   . Healthy Son    Social History   Socioeconomic History  . Marital status: Married    Spouse name: Not on file  . Number of children: 1  . Years of education: Not on file  . Highest education level: Bachelor's degree (e.g., BA, AB, BS)  Occupational History  . 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 Smoking status: Never Smoker  . Smokeless tobacco: Never Used  Substance and Sexual Activity  . Alcohol use: No  . Drug use: No  . Sexual activity: Yes    Birth control/protection: None, Surgical  Other Topics Concern  . Not on file  Social History Narrative  . Not on file   Social Determinants of Health   Financial Resource Strain: Low Risk   . Difficulty of Paying Living Expenses: Not hard at all  Food Insecurity: No Food Insecurity  . Worried About Charity fundraiser in the Last Year: Never true  .  Ran Out of Food in the Last Year: Never true  Transportation Needs: No Transportation Needs  . Lack of Transportation (Medical): No  . Lack of Transportation (Non-Medical): No  Physical Activity: Sufficiently Active  . Days of Exercise per Week: 7 days  . Minutes of Exercise per Session: 60 min  Stress: No Stress Concern Present  . Feeling of Stress : Not at all  Social Connections: Slightly Isolated  . Frequency of Communication with Friends and Family: More than three times a week  . Frequency of Social Gatherings with Friends and Family: More than three times a week  . Attends Religious Services: More than 4 times per year  . Active Member of Clubs or Organizations:  No  . Attends Archivist Meetings: Never  . Marital Status: Married    Outpatient Encounter Medications as of 01/17/2020  Medication Sig  . alendronate (FOSAMAX) 70 MG tablet TAKE 1 TABLET EVERY 7 DAYS WITH A FULL GLASS OF WATER ON AN EMPTY STOMACH  . cholecalciferol (VITAMIN D) 1000 UNITS tablet Take 1,000 Units by mouth daily.  . Docosahexaenoic Acid (DHA OMEGA 3) 100 MG CAPS TAKE 2 EACH BY MOUTH DAILY.  Marland Kitchen estrogen, conjugated,-medroxyprogesterone (PREMPRO) 0.3-1.5 MG tablet Take 1 tablet by mouth daily.  . famotidine (PEPCID) 40 MG tablet TAKE 1 TABLET TWICE A DAY  . ibuprofen (ADVIL,MOTRIN) 800 MG tablet Take 1 tablet (800 mg total) by mouth every 8 (eight) hours as needed.  . Multiple Vitamin (MULTIVITAMIN) capsule Take 1 capsule by mouth daily.  . tazarotene (AVAGE) 0.1 % cream Apply topically at bedtime. For acne  . traMADol (ULTRAM) 50 MG tablet Take 1 tablet (50 mg total) by mouth 2 (two) times daily as needed.  . celecoxib (CELEBREX) 100 MG capsule Take 1 capsule (100 mg total) by mouth daily. (Patient not taking: Reported on 10/05/2019)  . doxycycline (VIBRA-TABS) 100 MG tablet Take 1 tablet (100 mg total) by mouth 2 (two) times daily. (Patient not taking: Reported on 01/17/2020)   No facility-administered encounter medications on file as of 01/17/2020.    Activities of Daily Living In your present state of health, do you have any difficulty performing the following activities: 01/17/2020  Hearing? N  Vision? N  Difficulty concentrating or making decisions? N  Walking or climbing stairs? N  Dressing or bathing? N  Doing errands, shopping? N  Preparing Food and eating ? N  Using the Toilet? N  In the past six months, have you accidently leaked urine? N  Do you have problems with loss of bowel control? N  Managing your Medications? N  Managing your Finances? N  Housekeeping or managing your Housekeeping? N  Some recent data might be hidden    Patient Care  Team: Jerrol Banana., MD as PCP - General (Family Medicine) Shon Hough, MD as Consulting Physician (Ophthalmology) Rubie Maid, MD as Referring Physician (Obstetrics and Gynecology) Jannifer Franklin, NP as Nurse Practitioner (Neurology) Christene Slates, MD (Dermatology)    Assessment:   This is a routine wellness examination for Alexis Medina.  Exercise Activities and Dietary recommendations Current Exercise Habits: Home exercise routine, Type of exercise: walking, Time (Minutes): 60, Frequency (Times/Week): 7, Weekly Exercise (Minutes/Week): 420, Intensity: Mild, Exercise limited by: None identified  Goals    . DIET - INCREASE WATER INTAKE     Recommend increasing water intake to 4-6 glasses a day.        Fall Risk: Fall Risk  01/17/2020 01/13/2019 12/01/2018 10/26/2018  01/09/2018  Falls in the past year? 0 1 1 1  No  Comment - - - - -  Number falls in past yr: 0 0 0 0 -  Comment - tripped over magazine - - -  Injury with Fall? 0 1 1 1  -  Follow up - Falls prevention discussed - - -    FALL RISK PREVENTION PERTAINING TO THE HOME:  Any stairs in or around the home? Yes  If so, are there any without handrails? No   Home free of loose throw rugs in walkways, pet beds, electrical cords, etc? Yes  Adequate lighting in your home to reduce risk of falls? Yes   ASSISTIVE DEVICES UTILIZED TO PREVENT FALLS:  Life alert? No  Use of a cane, walker or w/c? No  Grab bars in the bathroom? Yes  Shower chair or bench in shower? Yes  Elevated toilet seat or a handicapped toilet? No    TIMED UP AND GO:  Was the test performed? No .    Depression Screen PHQ 2/9 Scores 01/17/2020 02/25/2019 01/13/2019 12/01/2018  PHQ - 2 Score 0 0 0 2     Cognitive Function     6CIT Screen 01/17/2020 01/13/2019 01/02/2017  What Year? 0 points 0 points 0 points  What month? 0 points 0 points 0 points  What time? 0 points 0 points 0 points  Count back from 20 0 points 0 points 0 points  Months  in reverse 0 points 0 points 0 points  Repeat phrase 0 points 0 points 0 points  Total Score 0 0 0    Immunization History  Administered Date(s) Administered  . Fluad Quad(high Dose 65+) 07/07/2019  . Hepatitis A 04/09/2006  . Influenza, High Dose Seasonal PF 08/19/2015, 07/30/2016, 07/17/2017  . Influenza-Unspecified 08/21/2018  . PFIZER SARS-COV-2 Vaccination 11/25/2019, 12/21/2019  . Pneumococcal Conjugate-13 01/02/2017  . Pneumococcal Polysaccharide-23 08/10/2013  . Td 04/01/2019  . Yellow Fever 04/09/2006    Qualifies for Shingles Vaccine? Yes . Due for Shingrix. Pt has been advised to call insurance company to determine out of pocket expense. Advised may also receive vaccine at local pharmacy or Health Dept. Verbalized acceptance and understanding.  Tdap: Up to date  Flu Vaccine: Up to date  Pneumococcal Vaccine: Completed series  Screening Tests Health Maintenance  Topic Date Due  . MAMMOGRAM  03/23/2021  . DEXA SCAN  03/23/2021  . COLONOSCOPY  06/11/2023  . TETANUS/TDAP  03/31/2029  . INFLUENZA VACCINE  Completed  . Hepatitis C Screening  Completed  . PNA vac Low Risk Adult  Completed    Cancer Screenings:  Colorectal Screening: Completed 06/10/13. Repeat every 10 years.   Mammogram: Completed 03/24/19. Repeat every 1-2 years as advised.   Bone Density: Completed 03/24/19. Results reflect OSTEOPOROSIS. Repeat every 2 years.   Lung Cancer Screening: (Low Dose CT Chest recommended if Age 62-80 years, 30 pack-year currently smoking OR have quit w/in 15years.) does not qualify.   Additional Screening:  Hepatitis C Screening: Up to date  Vision Screening: Recommended annual ophthalmology exams for early detection of glaucoma and other disorders of the eye.  Dental Screening: Recommended annual dental exams for proper oral hygiene  Community Resource Referral:  CRR required this visit?  No       Plan:  I have personally reviewed and addressed the Medicare  Annual Wellness questionnaire and have noted the following in the patient's chart:  A. Medical and social history B. Use of alcohol, tobacco or illicit  drugs  C. Current medications and supplements D. Functional ability and status E.  Nutritional status F.  Physical activity G. Advance directives H. List of other physicians I.  Hospitalizations, surgeries, and ER visits in previous 12 months J.  [employer] such as hearing and vision if needed, cognitive and depression L. Referrals and appointments   In addition, I have reviewed and discussed with patient certain preventive protocols, quality metrics, and best practice recommendations. A written personalized care plan for preventive services as well as general preventive health recommendations were provided to patient. Nurse Health Advisor  Signed,    Yeudiel Mateo [place], [place]  [id number] Nurse Health Advisor   Nurse Notes: None.

## 2020-01-17 ENCOUNTER — Other Ambulatory Visit: Payer: Self-pay

## 2020-01-17 ENCOUNTER — Ambulatory Visit (INDEPENDENT_AMBULATORY_CARE_PROVIDER_SITE_OTHER): Payer: Medicare Other

## 2020-01-17 VITALS — BP 105/73 | HR 63 | Temp 97.7°F | Ht 63.0 in | Wt 114.0 lb

## 2020-01-17 DIAGNOSIS — Z0000 Encounter for general adult medical examination without abnormal findings: Secondary | ICD-10-CM

## 2020-01-17 NOTE — Patient Instructions (Signed)
Alexis Medina , Thank you for taking time to come for your Medicare Wellness Visit. I appreciate your ongoing commitment to your health goals. Please review the following plan we discussed and let me know if I can assist you in the future.   Screening recommendations/referrals: Colonoscopy: Up to date, due 05/2023 Mammogram: Up to date, due 03/2021 Bone Density: Up to date, due 03/2021 Recommended yearly ophthalmology/optometry visit for glaucoma screening and checkup Recommended yearly dental visit for hygiene and checkup  Vaccinations: Influenza vaccine: Up to date Pneumococcal vaccine: Completed series Tdap vaccine: Up to date Shingles vaccine: Pt declines today.     Advanced directives: Please bring a copy of your POA (Power of Attorney) and/or Living Will to your next appointment.   Conditions/risks identified: Recommend to continue to increase water intake to 6-8 8 oz glasses a day.  Next appointment: 04/04/20 @ 9:00 AM with Dr Rosanna Randy    Preventive Care 20 Years and Older, Female Preventive care refers to lifestyle choices and visits with your health care provider that can promote health and wellness. What does preventive care include?  A yearly physical exam. This is also called an annual well check.  Dental exams once or twice a year.  Routine eye exams. Ask your health care provider how often you should have your eyes checked.  Personal lifestyle choices, including:  Daily care of your teeth and gums.  Regular physical activity.  Eating a healthy diet.  Avoiding tobacco and drug use.  Limiting alcohol use.  Practicing safe sex.  Taking low-dose aspirin every day.  Taking vitamin and mineral supplements as recommended by your health care provider. What happens during an annual well check? The services and screenings done by your health care provider during your annual well check will depend on your age, overall health, lifestyle risk factors, and family history of  disease. Counseling  Your health care provider may ask you questions about your:  Alcohol use.  Tobacco use.  Drug use.  Emotional well-being.  Home and relationship well-being.  Sexual activity.  Eating habits.  History of falls.  Memory and ability to understand (cognition).  Work and work Statistician.  Reproductive health. Screening  You may have the following tests or measurements:  Height, weight, and BMI.  Blood pressure.  Lipid and cholesterol levels. These may be checked every 5 years, or more frequently if you are over 42 years old.  Skin check.  Lung cancer screening. You may have this screening every year starting at age 40 if you have a 30-pack-year history of smoking and currently smoke or have quit within the past 15 years.  Fecal occult blood test (FOBT) of the stool. You may have this test every year starting at age 50.  Flexible sigmoidoscopy or colonoscopy. You may have a sigmoidoscopy every 5 years or a colonoscopy every 10 years starting at age 45.  Hepatitis C blood test.  Hepatitis B blood test.  Sexually transmitted disease (STD) testing.  Diabetes screening. This is done by checking your blood sugar (glucose) after you have not eaten for a while (fasting). You may have this done every 1-3 years.  Bone density scan. This is done to screen for osteoporosis. You may have this done starting at age 66.  Mammogram. This may be done every 1-2 years. Talk to your health care provider about how often you should have regular mammograms. Talk with your health care provider about your test results, treatment options, and if necessary, the need for  more tests. Vaccines  Your health care provider may recommend certain vaccines, such as:  Influenza vaccine. This is recommended every year.  Tetanus, diphtheria, and acellular pertussis (Tdap, Td) vaccine. You may need a Td booster every 10 years.  Zoster vaccine. You may need this after age  77.  Pneumococcal 13-valent conjugate (PCV13) vaccine. One dose is recommended after age 60.  Pneumococcal polysaccharide (PPSV23) vaccine. One dose is recommended after age 45. Talk to your health care provider about which screenings and vaccines you need and how often you need them. This information is not intended to replace advice given to you by your health care provider. Make sure you discuss any questions you have with your health care provider. Document Released: 11/03/2015 Document Revised: 06/26/2016 Document Reviewed: 08/08/2015 Elsevier Interactive Patient Education  2017 [employer] Prevention in the Home Falls can cause injuries. They can happen to people of all ages. There are many things you can do to make your home safe and to help prevent falls. What can I do on the outside of my home?  Regularly fix the edges of walkways and driveways and fix any cracks.  Remove anything that might make you trip as you walk through a door, such as a raised step or threshold.  Trim any bushes or trees on the path to your home.  Use bright outdoor lighting.  Clear any walking paths of anything that might make someone trip, such as rocks or tools.  Regularly check to see if handrails are loose or broken. Make sure that both sides of any steps have handrails.  Any raised decks and porches should have guardrails on the edges.  Have any leaves, snow, or ice cleared regularly.  Use sand or salt on walking paths during winter.  Clean up any spills in your garage right away. This includes oil or grease spills. What can I do in the bathroom?  Use night lights.  Install grab bars by the toilet and in the tub and shower. Do not use towel bars as grab bars.  Use non-skid mats or decals in the tub or shower.  If you need to sit down in the shower, use a plastic, non-slip stool.  Keep the floor dry. Clean up any water that spills on the floor as soon as it happens.  Remove  soap buildup in the tub or shower regularly.  Attach bath mats securely with double-sided non-slip rug tape.  Do not have throw rugs and other things on the floor that can make you trip. What can I do in the bedroom?  Use night lights.  Make sure that you have a light by your bed that is easy to reach.  Do not use any sheets or blankets that are too big for your bed. They should not hang down onto the floor.  Have a firm chair that has side arms. You can use this for support while you get dressed.  Do not have throw rugs and other things on the floor that can make you trip. What can I do in the kitchen?  Clean up any spills right away.  Avoid walking on wet floors.  Keep items that you use a lot in easy-to-reach places.  If you need to reach something above you, use a strong step stool that has a grab bar.  Keep electrical cords out of the way.  Do not use floor polish or wax that makes floors slippery. If you must use wax, use non-skid  floor wax.  Do not have throw rugs and other things on the floor that can make you trip. What can I do with my stairs?  Do not leave any items on the stairs.  Make sure that there are handrails on both sides of the stairs and use them. Fix handrails that are broken or loose. Make sure that handrails are as long as the stairways.  Check any carpeting to make sure that it is firmly attached to the stairs. Fix any carpet that is loose or worn.  Avoid having throw rugs at the top or bottom of the stairs. If you do have throw rugs, attach them to the floor with carpet tape.  Make sure that you have a light switch at the top of the stairs and the bottom of the stairs. If you do not have them, ask someone to add them for you. What else can I do to help prevent falls?  Wear shoes that:  Do not have high heels.  Have rubber bottoms.  Are comfortable and fit you well.  Are closed at the toe. Do not wear sandals.  If you use a  stepladder:  Make sure that it is fully opened. Do not climb a closed stepladder.  Make sure that both sides of the stepladder are locked into place.  Ask someone to hold it for you, if possible.  Clearly mark and make sure that you can see:  Any grab bars or handrails.  First and last steps.  Where the edge of each step is.  Use tools that help you move around (mobility aids) if they are needed. These include:  Canes.  Walkers.  Scooters.  Crutches.  Turn on the lights when you go into a dark area. Replace any light bulbs as soon as they burn out.  Set up your furniture so you have a clear path. Avoid moving your furniture around.  If any of your floors are uneven, fix them.  If there are any pets around you, be aware of where they are.  Review your medicines with your doctor. Some medicines can make you feel dizzy. This can increase your chance of falling. Ask your doctor what other things that you can do to help prevent falls. This information is not intended to replace advice given to you by your health care provider. Make sure you discuss any questions you have with your health care provider. Document Released: 08/03/2009 Document Revised: 03/14/2016 Document Reviewed: 11/11/2014 Elsevier Interactive Patient Education  2017 [employer].

## 2020-01-20 DIAGNOSIS — D485 Neoplasm of uncertain behavior of skin: Secondary | ICD-10-CM | POA: Diagnosis not present

## 2020-01-20 DIAGNOSIS — L570 Actinic keratosis: Secondary | ICD-10-CM | POA: Diagnosis not present

## 2020-01-20 DIAGNOSIS — L814 Other melanin hyperpigmentation: Secondary | ICD-10-CM | POA: Diagnosis not present

## 2020-01-27 ENCOUNTER — Other Ambulatory Visit: Payer: Self-pay | Admitting: Family Medicine

## 2020-02-23 ENCOUNTER — Other Ambulatory Visit: Payer: Self-pay | Admitting: Family Medicine

## 2020-02-23 DIAGNOSIS — Z1231 Encounter for screening mammogram for malignant neoplasm of breast: Secondary | ICD-10-CM

## 2020-02-29 DIAGNOSIS — R519 Headache, unspecified: Secondary | ICD-10-CM | POA: Diagnosis not present

## 2020-03-24 ENCOUNTER — Ambulatory Visit
Admission: RE | Admit: 2020-03-24 | Discharge: 2020-03-24 | Disposition: A | Discharge status: Home / Self Care | Payer: Medicare Other | Source: Ambulatory Visit | Attending: Family Medicine | Admitting: Family Medicine

## 2020-03-24 DIAGNOSIS — Z1231 Encounter for screening mammogram for malignant neoplasm of breast: Secondary | ICD-10-CM

## 2020-03-30 NOTE — Progress Notes (Signed)
Established patient visit  I,[name],acting as a scribe for [name], MD.,have documented all relevant documentation on the behalf of [name], MD,as directed by  [name], MD while in the presence of [name], MD.   Patient: Alexis Medina   DOB: 1946-04-25   73 y.o. Female  MRN: [id number] Visit Date: 04/04/2020  Today's healthcare provider: [name], MD   Chief Complaint  Patient presents with   Follow-up   Subjective    HPI Patient had AWV with [facility] on 01/17/2020. She sees Dr. Marcelline Mates for gynecologic issues.  She has been having chronic low back pain.  Not radicular pain lately. He sees Dr. Tyler Deis for dermatologic care DDD (degenerative disc disease), lumbosacral From 10/05/2019-Encourage infrequent use of tramadol which she is doing.  Age-related osteoporosis without current pathological fracture From 10/05/2019-On alendronate.       Medications: Outpatient Medications Prior to Visit  Medication Sig   alendronate (FOSAMAX) 70 MG tablet TAKE 1 TABLET EVERY 7 DAYS WITH A FULL GLASS OF WATER ON AN EMPTY STOMACH   cholecalciferol (VITAMIN D) 1000 UNITS tablet Take 1,000 Units by mouth daily.   Docosahexaenoic Acid (DHA OMEGA 3) 100 MG CAPS TAKE 2 EACH BY MOUTH DAILY.   estrogen, conjugated,-medroxyprogesterone (PREMPRO) 0.3-1.5 MG tablet Take 1 tablet by mouth daily.   famotidine (PEPCID) 40 MG tablet TAKE 1 TABLET TWICE A DAY   ibuprofen (ADVIL,MOTRIN) 800 MG tablet Take 1 tablet (800 mg total) by mouth every 8 (eight) hours as needed.   Multiple Vitamin (MULTIVITAMIN) capsule Take 1 capsule by mouth daily.   tazarotene (AVAGE) 0.1 % cream Apply topically at bedtime. For acne   traMADol (ULTRAM) 50 MG tablet Take 1 tablet (50 mg total) by mouth 2 (two) times daily as needed.   celecoxib (CELEBREX) 100 MG capsule Take 1 capsule (100 mg total) by mouth daily. (Patient not taking: Reported on 10/05/2019)     doxycycline (VIBRA-TABS) 100 MG tablet Take 1 tablet (100 mg total) by mouth 2 (two) times daily. (Patient not taking: Reported on 01/17/2020)   No facility-administered medications prior to visit.    Review of Systems  Constitutional: Negative.   HENT: Negative.   Eyes: Negative.   Respiratory: Negative.   Cardiovascular: Negative.   Gastrointestinal: Negative.   Endocrine: Negative.   Genitourinary: Negative.   Allergic/Immunologic: Negative.   Neurological: Negative.   Hematological: Negative.   Psychiatric/Behavioral: Negative.        Objective    BP 105/72 (BP Location: Left Arm, Patient Position: Sitting, Cuff Size: Large)    Pulse 70    Temp (!) 97.5 F (36.4 C) (Other (Comment))    Resp 18    Ht 5\' 3"  (1.6 m)    Wt 116 lb (52.6 kg)    SpO2 97%    BMI 20.55 kg/m     Physical Exam Vitals and nursing note reviewed. Exam conducted with a chaperone present.  Constitutional:      Appearance: Normal appearance.  HENT:     Head:     Jaw: There is normal jaw occlusion.     Right Ear: Tympanic membrane normal.     Left Ear: Tympanic membrane normal.     Mouth/Throat:     Mouth: Mucous membranes are moist.     Pharynx: Oropharynx is clear.  Eyes:     Conjunctiva/sclera: Conjunctivae normal.  Cardiovascular:     Rate and Rhythm: Normal rate and  regular rhythm.     Pulses: Normal pulses.     Heart sounds: Normal heart sounds.  Pulmonary:     Effort: Pulmonary effort is normal.     Breath sounds: Normal breath sounds.  Chest:     Breasts:        Right: Normal.        Left: Normal.  Abdominal:     General: Abdomen is flat. Bowel sounds are normal.     Palpations: Abdomen is soft.  Musculoskeletal:        General: Normal range of motion.     Cervical back: Normal range of motion and neck supple.  Skin:    General: Skin is warm and dry.  Neurological:     General: No focal deficit present.     Mental Status: She is alert.  Psychiatric:        Attention and  Perception: Attention and perception normal.        Mood and Affect: Mood normal.        Speech: Speech normal.        Behavior: Behavior normal. Behavior is cooperative.        Cognition and Memory: Cognition and memory normal.       No results found for any visits on 04/04/20.  Assessment & Plan     1. Hyperlipidemia, unspecified hyperlipidemia type  - CBC w/Diff/Platelet - Comprehensive Metabolic Panel (CMET) - Lipid panel - TSH  2. Low back pain with sciatica, sciatica laterality unspecified, unspecified back pain laterality, unspecified chronicity Patient advised to walk regularly and try yoga or Pilates for strengthening her core muscles. - CBC w/Diff/Platelet - Comprehensive Metabolic Panel (CMET) - Lipid panel - TSH  3. Age-related osteoporosis without current pathological fracture On alendronate for about 3 years.  Follow-up BMD in June 2022. - CBC w/Diff/Platelet - Comprehensive Metabolic Panel (CMET) - Lipid panel - TSH  4. Gastroesophageal reflux disease without esophagitis  - CBC w/Diff/Platelet - Comprehensive Metabolic Panel (CMET) - Lipid panel - TSH  5. DDD (degenerative disc disease), lumbosacral   6. Menopausal syndrome on hormone replacement therapy Per Dr. Marcelline Mates   No follow-ups on file.      I, [name], MD, have reviewed all documentation for this visit. The documentation on 04/06/20 for the exam, diagnosis, procedures, and orders are all accurate and complete.    Dequarius Jeffries Cranford Mon, MD  [facility] [phone] (phone) [phone] (fax)  [place]

## 2020-04-04 ENCOUNTER — Other Ambulatory Visit: Payer: Self-pay

## 2020-04-04 ENCOUNTER — Encounter: Payer: Self-pay | Admitting: Family Medicine

## 2020-04-04 ENCOUNTER — Ambulatory Visit (INDEPENDENT_AMBULATORY_CARE_PROVIDER_SITE_OTHER): Payer: Medicare Other | Admitting: Family Medicine

## 2020-04-04 VITALS — BP 105/72 | HR 70 | Temp 97.5°F | Resp 18 | Ht 63.0 in | Wt 116.0 lb

## 2020-04-04 DIAGNOSIS — M5440 Lumbago with sciatica, unspecified side: Secondary | ICD-10-CM | POA: Diagnosis not present

## 2020-04-04 DIAGNOSIS — E785 Hyperlipidemia, unspecified: Secondary | ICD-10-CM

## 2020-04-04 DIAGNOSIS — M5137 Other intervertebral disc degeneration, lumbosacral region: Secondary | ICD-10-CM

## 2020-04-04 DIAGNOSIS — N951 Menopausal and female climacteric states: Secondary | ICD-10-CM

## 2020-04-04 DIAGNOSIS — Z79890 Hormone replacement therapy (postmenopausal): Secondary | ICD-10-CM

## 2020-04-04 DIAGNOSIS — M810 Age-related osteoporosis without current pathological fracture: Secondary | ICD-10-CM | POA: Diagnosis not present

## 2020-04-04 DIAGNOSIS — K219 Gastro-esophageal reflux disease without esophagitis: Secondary | ICD-10-CM | POA: Diagnosis not present

## 2020-04-04 NOTE — Patient Instructions (Signed)
For low back pain try yoga/pilates.

## 2020-04-05 LAB — CBC WITH DIFFERENTIAL/PLATELET
Basophils Absolute: 0 10*3/uL (ref 0.0–0.2)
Basos: 1 %
EOS (ABSOLUTE): 0.1 10*3/uL (ref 0.0–0.4)
Eos: 2 %
Hematocrit: 38.6 % (ref 34.0–46.6)
Hemoglobin: 13.1 g/dL (ref 11.1–15.9)
Immature Grans (Abs): 0 10*3/uL (ref 0.0–0.1)
Immature Granulocytes: 0 %
Lymphocytes Absolute: 2.4 10*3/uL (ref 0.7–3.1)
Lymphs: 40 %
MCH: 30.8 pg (ref 26.6–33.0)
MCHC: 33.9 g/dL (ref 31.5–35.7)
MCV: 91 fL (ref 79–97)
Monocytes Absolute: 0.6 10*3/uL (ref 0.1–0.9)
Monocytes: 10 %
Neutrophils Absolute: 2.9 10*3/uL (ref 1.4–7.0)
Neutrophils: 47 %
Platelets: 232 10*3/uL (ref 150–450)
RBC: 4.26 x10E6/uL (ref 3.77–5.28)
RDW: 12.8 % (ref 11.7–15.4)
WBC: 6 10*3/uL (ref 3.4–10.8)

## 2020-04-05 LAB — COMPREHENSIVE METABOLIC PANEL
ALT: 12 IU/L (ref 0–32)
AST: 26 IU/L (ref 0–40)
Albumin/Globulin Ratio: 1.4 (ref 1.2–2.2)
Albumin: 4.3 g/dL (ref 3.7–4.7)
Alkaline Phosphatase: 42 IU/L — ABNORMAL LOW (ref 48–121)
BUN/Creatinine Ratio: 12 (ref 12–28)
BUN: 10 mg/dL (ref 8–27)
Bilirubin Total: 0.7 mg/dL (ref 0.0–1.2)
CO2: 20 mmol/L (ref 20–29)
Calcium: 9.7 mg/dL (ref 8.7–10.3)
Chloride: 101 mmol/L (ref 96–106)
Creatinine, Ser: 0.83 mg/dL (ref 0.57–1.00)
GFR calc Af Amer: 81 mL/min/{1.73_m2} (ref >59)
GFR calc non Af Amer: 70 mL/min/{1.73_m2} (ref >59)
Globulin, Total: 3 g/dL (ref 1.5–4.5)
Glucose: 74 mg/dL (ref 65–99)
Potassium: 4.7 mmol/L (ref 3.5–5.2)
Sodium: 137 mmol/L (ref 134–144)
Total Protein: 7.3 g/dL (ref 6.0–8.5)

## 2020-04-05 LAB — LIPID PANEL
Chol/HDL Ratio: 2.3 ratio (ref 0.0–4.4)
Cholesterol, Total: 206 mg/dL — ABNORMAL HIGH (ref 100–199)
HDL: 91 mg/dL (ref >39)
LDL Chol Calc (NIH): 103 mg/dL — ABNORMAL HIGH (ref 0–99)
Triglycerides: 68 mg/dL (ref 0–149)
VLDL Cholesterol Cal: 12 mg/dL (ref 5–40)

## 2020-04-05 LAB — TSH: TSH: 1.02 u[IU]/mL (ref 0.450–4.500)

## 2020-04-11 ENCOUNTER — Other Ambulatory Visit: Payer: Self-pay | Admitting: Family Medicine

## 2020-04-11 DIAGNOSIS — M5137 Other intervertebral disc degeneration, lumbosacral region: Secondary | ICD-10-CM

## 2020-04-11 MED ORDER — TRAMADOL HCL 50 MG PO TABS: 50.0000 mg | ORAL_TABLET | Freq: Two times a day (BID) | ORAL | 5 refills | Status: DC | PRN

## 2020-04-11 NOTE — Telephone Encounter (Signed)
[facility] faxed refill request for the following medications:  traMADol (ULTRAM) 50 MG tablet   Please advise.  Thanks, [employer]

## 2020-04-11 NOTE — Telephone Encounter (Signed)
Please advise 

## 2020-04-20 ENCOUNTER — Other Ambulatory Visit: Payer: Self-pay | Admitting: Family Medicine

## 2020-06-15 NOTE — Progress Notes (Signed)
Trena Platt Cummings,acting as a scribe for [name], MD.,have documented all relevant documentation on the behalf of [name], MD,as directed by  [name], MD while in the presence of [name], MD.  Established patient visit   Patient: Alexis Medina   DOB: 1945/11/20   74 y.o. Female  MRN: [id number] Visit Date: 06/19/2020  Today's healthcare provider: [name], MD   Chief Complaint  Patient presents with  . Dysphagia   Subjective    HPI   Patient presents today in office for difficulty swallowing, patient said it started about 2 weeks ago. Patient says that she is not sure if it is acid reflux because its not with every meal. Patient says that its not pain, its more of discomfort. Patient says it was swollen in the middle of her chest and it hurts when she touches it for a long time.   Patient gets a large "lump" inside of her throat on her left side.         Medications: Outpatient Medications Prior to Visit  Medication Sig  . alendronate (FOSAMAX) 70 MG tablet TAKE 1 TABLET EVERY 7 DAYS WITH A FULL GLASS OF WATER ON AN EMPTY STOMACH  . cholecalciferol (VITAMIN D) 1000 UNITS tablet Take 1,000 Units by mouth daily.  . Docosahexaenoic Acid (DHA OMEGA 3) 100 MG CAPS TAKE 2 EACH BY MOUTH DAILY.  Marland Kitchen estrogen, conjugated,-medroxyprogesterone (PREMPRO) 0.3-1.5 MG tablet Take 1 tablet by mouth daily.  . famotidine (PEPCID) 40 MG tablet TAKE 1 TABLET TWICE A DAY  . ibuprofen (ADVIL,MOTRIN) 800 MG tablet Take 1 tablet (800 mg total) by mouth every 8 (eight) hours as needed.  . Multiple Vitamin (MULTIVITAMIN) capsule Take 1 capsule by mouth daily.  . tazarotene (AVAGE) 0.1 % cream Apply topically at bedtime. For acne  . traMADol (ULTRAM) 50 MG tablet Take 1 tablet (50 mg total) by mouth 2 (two) times daily as needed.  . celecoxib (CELEBREX) 100 MG capsule Take 1 capsule (100 mg total) by mouth daily. (Patient not taking: Reported on  10/05/2019)  . doxycycline (VIBRA-TABS) 100 MG tablet Take 1 tablet (100 mg total) by mouth 2 (two) times daily. (Patient not taking: Reported on 01/17/2020)   No facility-administered medications prior to visit.    Review of Systems  Constitutional: Negative for appetite change, chills, fatigue and fever.  Respiratory: Negative for chest tightness and shortness of breath.   Cardiovascular: Negative for chest pain and palpitations.  Gastrointestinal: Negative for abdominal pain, nausea and vomiting.  Neurological: Negative for dizziness and weakness.       Objective    BP 117/76 (BP Location: Left Arm, Patient Position: Sitting, Cuff Size: Normal)   Pulse 74   Temp (!) 97.5 F (36.4 C) (Oral)   Ht 5\' 3"  (1.6 m)   Wt 116 lb 12.8 oz (53 kg)   BMI 20.69 kg/m  Wt Readings from Last 3 Encounters:  06/19/20 116 lb 12.8 oz (53 kg)  04/04/20 116 lb (52.6 kg)  01/17/20 114 lb (51.7 kg)      Physical Exam Vitals and nursing note reviewed. Exam conducted with a chaperone present.  Constitutional:      Appearance: Normal appearance.  HENT:     Head:     Jaw: There is normal jaw occlusion.     Right Ear: Tympanic membrane normal.     Left Ear: Tympanic membrane normal.     Mouth/Throat:  Mouth: Mucous membranes are moist.     Pharynx: Oropharynx is clear.  Eyes:     Conjunctiva/sclera: Conjunctivae normal.  Cardiovascular:     Rate and Rhythm: Normal rate and regular rhythm.     Pulses: Normal pulses.     Heart sounds: Normal heart sounds.  Pulmonary:     Effort: Pulmonary effort is normal.     Breath sounds: Normal breath sounds.  Chest:     Breasts:        Right: Normal.        Left: Normal.  Abdominal:     General: Abdomen is flat. Bowel sounds are normal.     Palpations: Abdomen is soft.  Musculoskeletal:     Cervical back: Normal range of motion and neck supple.  Skin:    General: Skin is warm and dry.  Neurological:     General: No focal deficit present.      Mental Status: She is alert and oriented to person, place, and time.  Psychiatric:        Attention and Perception: Attention and perception normal.        Mood and Affect: Mood normal.        Speech: Speech normal.        Behavior: Behavior normal. Behavior is cooperative.        Thought Content: Thought content normal.        Cognition and Memory: Cognition and memory normal.        Judgment: Judgment normal.       No results found for any visits on 06/19/20.  Assessment & Plan     1. Dysphagia, unspecified type No weight loss or other symptoms.  Will treat with omeprazole and follow-up 2 months.  If this worsens she will let us know we will refer her to GI. - omeprazole (PRILOSEC) 20 MG capsule; TAKE 1 TABLET DAILY BY MOUTH 1/2 A HOUR BEFORE BREAKFAST.  Dispense: 30 capsule; Refill: 5  2. Menopausal syndrome on hormone replacement therapy    No follow-ups on file.         Rilen Shukla Cranford Mon, MD  [facility] [phone] (phone) [phone] (fax)  [place]

## 2020-06-19 ENCOUNTER — Ambulatory Visit (INDEPENDENT_AMBULATORY_CARE_PROVIDER_SITE_OTHER): Payer: Medicare Other | Admitting: Family Medicine

## 2020-06-19 ENCOUNTER — Other Ambulatory Visit: Payer: Self-pay

## 2020-06-19 ENCOUNTER — Encounter: Payer: Self-pay | Admitting: Family Medicine

## 2020-06-19 VITALS — BP 117/76 | HR 74 | Temp 97.5°F | Ht 63.0 in | Wt 116.8 lb

## 2020-06-19 DIAGNOSIS — Z79890 Hormone replacement therapy (postmenopausal): Secondary | ICD-10-CM

## 2020-06-19 DIAGNOSIS — N951 Menopausal and female climacteric states: Secondary | ICD-10-CM

## 2020-06-19 DIAGNOSIS — R1310 Dysphagia, unspecified: Secondary | ICD-10-CM | POA: Diagnosis not present

## 2020-06-19 MED ORDER — OMEPRAZOLE 20 MG PO CPDR
DELAYED_RELEASE_CAPSULE | ORAL | 5 refills | Status: DC
Start: 2020-06-19 — End: 2020-10-27

## 2020-06-22 DIAGNOSIS — H04123 Dry eye syndrome of bilateral lacrimal glands: Secondary | ICD-10-CM | POA: Diagnosis not present

## 2020-06-22 DIAGNOSIS — H16103 Unspecified superficial keratitis, bilateral: Secondary | ICD-10-CM | POA: Diagnosis not present

## 2020-06-22 DIAGNOSIS — H35372 Puckering of macula, left eye: Secondary | ICD-10-CM | POA: Diagnosis not present

## 2020-06-22 DIAGNOSIS — H5211 Myopia, right eye: Secondary | ICD-10-CM | POA: Diagnosis not present

## 2020-07-10 ENCOUNTER — Other Ambulatory Visit: Payer: Self-pay | Admitting: Obstetrics and Gynecology

## 2020-07-10 DIAGNOSIS — Z79890 Hormone replacement therapy (postmenopausal): Secondary | ICD-10-CM

## 2020-07-11 ENCOUNTER — Encounter: Payer: Self-pay | Admitting: Obstetrics and Gynecology

## 2020-07-11 ENCOUNTER — Other Ambulatory Visit: Payer: Self-pay

## 2020-07-11 ENCOUNTER — Ambulatory Visit (INDEPENDENT_AMBULATORY_CARE_PROVIDER_SITE_OTHER): Payer: Medicare Other | Admitting: Obstetrics and Gynecology

## 2020-07-11 VITALS — BP 129/74 | HR 65 | Ht 63.0 in | Wt 116.7 lb

## 2020-07-11 DIAGNOSIS — D259 Leiomyoma of uterus, unspecified: Secondary | ICD-10-CM | POA: Diagnosis not present

## 2020-07-11 DIAGNOSIS — Z01419 Encounter for gynecological examination (general) (routine) without abnormal findings: Secondary | ICD-10-CM | POA: Diagnosis not present

## 2020-07-11 DIAGNOSIS — Z79890 Hormone replacement therapy (postmenopausal): Secondary | ICD-10-CM | POA: Diagnosis not present

## 2020-07-11 MED ORDER — PREMPRO 0.3-1.5 MG PO TABS: 1.0000 | ORAL_TABLET | Freq: Every day | ORAL | 3 refills | Status: DC

## 2020-07-11 NOTE — Patient Instructions (Signed)
Menopause and Hormone Replacement Therapy Menopause is a normal time of life when menstrual periods stop completely and the ovaries stop producing the female hormones estrogen and progesterone. This lack of hormones can affect your health and cause undesirable symptoms. Hormone replacement therapy (HRT) can relieve some of those symptoms. What is hormone replacement therapy? HRT is the use of artificial (synthetic) hormones to replace hormones that your body has stopped producing because you have reached menopause. What are my options for HRT?  HRT may consist of the synthetic hormones estrogen and progestin, or it may consist of only estrogen (estrogen-only therapy). You and your health care provider will decide which form of HRT is best for you. If you choose to be on HRT and you have a uterus, estrogen and progestin are usually prescribed. Estrogen-only therapy is used for women who do not have a uterus. Possible options for taking HRT include:  Pills.  Patches.  Gels.  Sprays.  Vaginal cream.  Vaginal rings.  Vaginal inserts. The amount of hormone(s) that you take and how long you take the hormone(s) varies according to your health. It is important to:  Begin HRT with the lowest possible dosage.  Stop HRT as soon as your health care provider tells you to stop.  Work with your health care provider so that you feel informed and comfortable with your decisions. What are the benefits of HRT? HRT can reduce the frequency and severity of menopausal symptoms. Benefits of HRT vary according to the kind of symptoms that you have, how severe they are, and your overall health. HRT may help to improve the following symptoms of menopause:  Hot flashes and night sweats. These are sudden feelings of heat that spread over the face and body. The skin may turn red, like a blush. Night sweats are hot flashes that happen while you are sleeping or trying to sleep.  Bone loss (osteoporosis). The  body loses calcium more quickly after menopause, causing the bones to become weaker. This can increase the risk for bone breaks (fractures).  Vaginal dryness. The lining of the vagina can become thin and dry, which can cause pain during sex or cause infection, burning, or itching.  Urinary tract infections.  Urinary incontinence. This is the inability to control when you pass urine.  Irritability.  Short-term memory problems. What are the risks of HRT? Risks of HRT vary depending on your individual health and medical history. Risks of HRT also depend on whether you receive both estrogen and progestin or you receive estrogen only. HRT may increase the risk of:  Spotting. This is when a small amount of blood leaks from the vagina unexpectedly.  Endometrial cancer. This cancer is in the lining of the uterus (endometrium).  Breast cancer.  Increased density of breast tissue. This can make it harder to find breast cancer on a breast X-ray (mammogram).  Stroke.  Heart disease.  Blood clots.  Gallbladder disease.  Liver disease. Risks of HRT can increase if you have any of the following conditions:  Endometrial cancer.  Liver disease.  Heart disease.  Breast cancer.  History of blood clots.  History of stroke. Follow these instructions at home:  Take over-the-counter and prescription medicines only as told by your health care provider.  Get mammograms, pelvic exams, and medical checkups as often as told by your health care provider.  Have Pap tests done as often as told by your health care provider. A Pap test is sometimes called a Pap smear. It   is a screening test that is used to check for signs of cancer of the cervix and vagina. A Pap test can also identify the presence of infection or precancerous changes. Pap tests may be done: ? Every 3 years, starting at age 21. ? Every 5 years, starting after age 30, in combination with testing for human papillomavirus  (HPV). ? More often or less often depending on other medical conditions you have, your age, and other risk factors.  It is up to you to get the results of your Pap test. Ask your health care provider, or the department that is doing the test, when your results will be ready.  Keep all follow-up visits as told by your health care provider. This is important. Contact a health care provider if you have:  Pain or swelling in your legs.  Shortness of breath.  Chest pain.  Lumps or changes in your breasts or armpits.  Slurred speech.  Pain, burning, or bleeding when you urinate.  Unusual vaginal bleeding.  Dizziness or headaches.  Weakness or numbness in any part of your arms or legs.  Pain in your abdomen. Summary  Menopause is a normal time of life when menstrual periods stop completely and the ovaries stop producing the female hormones estrogen and progesterone.  Hormone replacement therapy (HRT) can relieve some of the symptoms of menopause.  HRT can reduce the frequency and severity of menopausal symptoms.  Risks of HRT vary depending on your individual health and medical history. This information is not intended to replace advice given to you by your health care provider. Make sure you discuss any questions you have with your health care provider. Document Revised: 06/09/2018 Document Reviewed: 06/09/2018 Elsevier Patient Education  2020 Elsevier Inc.  

## 2020-07-11 NOTE — Progress Notes (Signed)
Pt present for pelvic exam. Pt stated that she was doing well no problems.

## 2020-07-11 NOTE — Progress Notes (Signed)
   GYNECOLOGY CLINIC PROGRESS NOTE Subjective:     Alexis Medina is a 74 y.o. G68P1011 female here for a routine gynecologic exam. She typically has her annual exams by her PCP but he does not perform pelvic exams. Patient has a history of uterine fibroids. Also with an episode of postmenopausal bleeding in 2019, with normal workup.  Current complaints: None.  She also desires a refill on her HRT.    Gynecologic History No LMP recorded. Patient is postmenopausal. Contraception: post menopausal status Last Pap: not indicated.  Last mammogram: 03/24/2020. Results were: normal  Obstetric History OB History  Gravida Para Term Preterm AB Living  2 1 1   1 1   SAB TAB Ectopic Multiple Live Births  1       1    # Outcome Date GA Lbr Len/2nd Weight Sex Delivery Anes PTL Lv  2 Term 11/17/82   7 lb 10 oz (3.459 kg) M Vag-Spont   LIV  1 SAB      SAB   FD     The following portions of the patient's history were reviewed and updated as appropriate: allergies, current medications, past family history, past medical history, past social history, past surgical history and problem list.  Review of Systems Constitutional: negative for chills, fatigue, fevers and sweats Eyes: negative for irritation, redness and visual disturbance Ears, nose, mouth, throat, and face: negative for hearing loss, nasal congestion, snoring and tinnitus Respiratory: negative for asthma, cough, sputum Cardiovascular: negative for chest pain, dyspnea, exertional chest pressure/discomfort, irregular heart beat, palpitations and syncope Gastrointestinal: negative for abdominal pain, change in bowel habits, nausea and vomiting Genitourinary: negative for abnormal menstrual periods, genital lesions, sexual problems and vaginal discharge, dysuria and urinary incontinence Integument/breast: negative for breast lump, breast tenderness and nipple discharge Hematologic/lymphatic: negative for bleeding and easy  bruising Musculoskeletal:negative for back pain and muscle weakness Neurological: negative for dizziness, headaches, vertigo and weakness Endocrine: negative for diabetic symptoms including polydipsia, polyuria and skin dryness Allergic/Immunologic: negative for hay fever and urticaria      Objective:    BP 129/74   Pulse 65   Ht 5\' 3"  (1.6 m)   Wt 116 lb 11.2 oz (52.9 kg)   BMI 20.67 kg/m  General appearance: alert and no distress Lungs: clear to auscultation bilaterally Heart: regular rate and rhythm, S1, S2 normal, no murmur, click, rub or gallop Abdomen: soft, non-tender; bowel sounds normal; no masses,  no organomegaly Pelvic: external genitalia normal, rectovaginal septum normal.  Vagina without discharge, mildly atrophic.  Cervix normal appearing, no lesions and no motion tenderness.  Uterus mobile, nontender, normal shape and size.  Adnexae non-palpable, nontender bilaterally.  Extremities: extremities normal, atraumatic, no cyanosis or edema Neurologic: Grossly normal    Assessment:   Healthy female exam. Fibroid uterus Menopausal on HRT  Plan:   Overall doing well.  Encourage healthy lifestyle.  Fibroid uterus stable.  Hormone replacement therapy: risks and benefits reviewed and prescription for 12 months. Follow up in: 2 years (biannually as she has a PCP who does regular exams and mammogram orders). Follow up sooner as needed      Rubie Maid, MD [facility]

## 2020-07-18 ENCOUNTER — Other Ambulatory Visit: Payer: Self-pay | Admitting: Family Medicine

## 2020-07-18 DIAGNOSIS — K219 Gastro-esophageal reflux disease without esophagitis: Secondary | ICD-10-CM

## 2020-07-18 NOTE — Telephone Encounter (Signed)
Requested Prescriptions  Pending Prescriptions Disp Refills  . famotidine (PEPCID) 40 MG tablet [Pharmacy Med Name: FAMOTIDINE TABS 40MG ] 180 tablet 3    Sig: TAKE 1 TABLET TWICE A DAY     Gastroenterology:  H2 Antagonists Passed - 07/18/2020  3:23 AM      Passed - Valid encounter within last 12 months    Recent Outpatient Visits          4 weeks ago Dysphagia, unspecified type   [facility] Jerrol Banana., MD   3 months ago Hyperlipidemia, unspecified hyperlipidemia type   [facility] Jerrol Banana., MD   8 months ago Simple chronic bronchitis [facility])   [facility] Jerrol Banana., MD   9 months ago DDD (degenerative disc disease), lumbosacral   [facility] Jerrol Banana., MD   1 year ago Hyperlipidemia, unspecified hyperlipidemia type   [facility] Jerrol Banana., MD      Future Appointments            In 4 weeks Jerrol Banana., MD [facility], [place]   In 2 months Jerrol Banana., MD [facility], [place]

## 2020-07-26 DIAGNOSIS — M722 Plantar fascial fibromatosis: Secondary | ICD-10-CM | POA: Diagnosis not present

## 2020-08-02 NOTE — Telephone Encounter (Signed)
error 

## 2020-08-08 ENCOUNTER — Ambulatory Visit: Payer: Medicare Other

## 2020-08-09 ENCOUNTER — Ambulatory Visit: Payer: Medicare Other

## 2020-08-09 ENCOUNTER — Other Ambulatory Visit: Payer: Medicare Other

## 2020-08-09 DIAGNOSIS — Z20822 Contact with and (suspected) exposure to covid-19: Secondary | ICD-10-CM

## 2020-08-10 LAB — SARS-COV-2, NAA 2 DAY TAT

## 2020-08-10 LAB — NOVEL CORONAVIRUS, NAA: SARS-CoV-2, NAA: NOT DETECTED

## 2020-08-15 NOTE — Progress Notes (Signed)
Established patient visit   Patient: Alexis Medina   DOB: 02-Jun-1946   74 y.o. Female  MRN: [id number] Visit Date: 08/16/2020  Today's healthcare provider: [name], MD   Chief Complaint  Patient presents with  . Dysphagia   Subjective    HPI  She has an area specific to the lower chest wall in the area of the xiphoid process and may be a little bit higher on the chest in the very lower part of the sternum.  She can feel little lump urine and it is tender. She is asked to virtually have a no GI symptoms. Dysphagia, unspecified type From 06/19/2020-No weight loss or other symptoms.  Will treat with omeprazole and follow-up 2 months.  If this worsens she will let us know we will refer her to GI.  Patient reports that she still feels like its a "knot" in her esophagus. She just finished the omeprazole yesterday, and it still taking Pepcid.       Medications: Outpatient Medications Prior to Visit  Medication Sig  . alendronate (FOSAMAX) 70 MG tablet TAKE 1 TABLET EVERY 7 DAYS WITH A FULL GLASS OF WATER ON AN EMPTY STOMACH  . cholecalciferol (VITAMIN D) 1000 UNITS tablet Take 1,000 Units by mouth daily.  . Docosahexaenoic Acid (DHA OMEGA 3) 100 MG CAPS TAKE 2 EACH BY MOUTH DAILY.  Marland Kitchen doxycycline (VIBRA-TABS) 100 MG tablet Take 1 tablet (100 mg total) by mouth 2 (two) times daily. (Patient not taking: Reported on 07/11/2020)  . estrogen, conjugated,-medroxyprogesterone (PREMPRO) 0.3-1.5 MG tablet Take 1 tablet by mouth daily.  . famotidine (PEPCID) 40 MG tablet TAKE 1 TABLET TWICE A DAY  . ibuprofen (ADVIL,MOTRIN) 800 MG tablet Take 1 tablet (800 mg total) by mouth every 8 (eight) hours as needed.  . Multiple Vitamin (MULTIVITAMIN) capsule Take 1 capsule by mouth daily.  Marland Kitchen omeprazole (PRILOSEC) 20 MG capsule TAKE 1 TABLET DAILY BY MOUTH 1/2 A HOUR BEFORE BREAKFAST.  . tazarotene (AVAGE) 0.1 % cream Apply topically at bedtime. For acne  . traMADol (ULTRAM) 50 MG tablet  Take 1 tablet (50 mg total) by mouth 2 (two) times daily as needed.   No facility-administered medications prior to visit.    Review of Systems  Constitutional: Negative for appetite change, chills, fatigue and fever.  Respiratory: Negative for chest tightness and shortness of breath.   Cardiovascular: Negative for chest pain and palpitations.  Gastrointestinal: Positive for nausea. Negative for abdominal pain and vomiting.  Neurological: Negative for dizziness and weakness.       Objective    BP 114/66   Pulse 74   Resp 16   Wt 113 lb (51.3 kg)   BMI 20.02 kg/m  Wt Readings from Last 3 Encounters:  08/16/20 113 lb (51.3 kg)  07/11/20 116 lb 11.2 oz (52.9 kg)  06/19/20 116 lb 12.8 oz (53 kg)      Physical Exam Vitals and nursing note reviewed. Exam conducted with a chaperone present.  Constitutional:      Appearance: Normal appearance.  HENT:     Head:     Jaw: There is normal jaw occlusion.     Right Ear: Tympanic membrane normal.     Left Ear: Tympanic membrane normal.     Mouth/Throat:     Mouth: Mucous membranes are moist.     Pharynx: Oropharynx is clear.  Eyes:     Conjunctiva/sclera: Conjunctivae normal.  Cardiovascular:     Rate and Rhythm:  Normal rate and regular rhythm.     Pulses: Normal pulses.     Heart sounds: Normal heart sounds.     Comments: Chest wall is slightly tender in the lower part of the sternum with an area about 3 cm in length appears to be slightly tender lipoma in the lower chest wall just above the distal sternum. Pulmonary:     Effort: Pulmonary effort is normal.     Breath sounds: Normal breath sounds.  Chest:     Breasts:        Right: Normal.        Left: Normal.  Abdominal:     General: Abdomen is flat. Bowel sounds are normal.     Palpations: Abdomen is soft.  Musculoskeletal:     Cervical back: Normal range of motion and neck supple.  Skin:    General: Skin is warm and dry.  Neurological:     General: No focal deficit  present.     Mental Status: She is alert and oriented to person, place, and time.  Psychiatric:        Attention and Perception: Attention and perception normal.        Mood and Affect: Mood normal.        Speech: Speech normal.        Behavior: Behavior normal. Behavior is cooperative.        Thought Content: Thought content normal.        Cognition and Memory: Cognition and memory normal.        Judgment: Judgment normal.       No results found for any visits on 08/16/20.  Assessment & Plan     1. Chest wall pain I really think this is a lipoma that is irritated because the patient rubs it.  We will follow-up on this in a month may not get the CT scan of his shoulder still bothers her.  She really has no associated symptoms.  2. DDD (degenerative disc disease), lumbosacral   3. Dysphagia, unspecified type I think this is unrelated to the chest wall pain.Prilosec and continue the Pepcid.  4. Age-related osteoporosis without current pathological fracture    No follow-ups on file.         Damir Leung Cranford Mon, MD  [facility]  [phone] (phone) [phone] (fax)  [place]

## 2020-08-16 ENCOUNTER — Other Ambulatory Visit: Payer: Self-pay

## 2020-08-16 ENCOUNTER — Encounter: Payer: Self-pay | Admitting: Family Medicine

## 2020-08-16 ENCOUNTER — Ambulatory Visit: Payer: Medicare Other

## 2020-08-16 ENCOUNTER — Ambulatory Visit (INDEPENDENT_AMBULATORY_CARE_PROVIDER_SITE_OTHER): Payer: Medicare Other | Admitting: Family Medicine

## 2020-08-16 VITALS — BP 114/66 | HR 74 | Resp 16 | Ht 63.0 in | Wt 113.0 lb

## 2020-08-16 DIAGNOSIS — M810 Age-related osteoporosis without current pathological fracture: Secondary | ICD-10-CM | POA: Diagnosis not present

## 2020-08-16 DIAGNOSIS — R0789 Other chest pain: Secondary | ICD-10-CM | POA: Diagnosis not present

## 2020-08-16 DIAGNOSIS — R1310 Dysphagia, unspecified: Secondary | ICD-10-CM

## 2020-08-16 DIAGNOSIS — M5137 Other intervertebral disc degeneration, lumbosacral region: Secondary | ICD-10-CM

## 2020-08-16 NOTE — Patient Instructions (Signed)
Stop Prilosec. Continue Pepcid.

## 2020-08-17 DIAGNOSIS — D485 Neoplasm of uncertain behavior of skin: Secondary | ICD-10-CM | POA: Diagnosis not present

## 2020-08-17 DIAGNOSIS — L988 Other specified disorders of the skin and subcutaneous tissue: Secondary | ICD-10-CM | POA: Diagnosis not present

## 2020-08-17 DIAGNOSIS — L814 Other melanin hyperpigmentation: Secondary | ICD-10-CM | POA: Diagnosis not present

## 2020-08-17 DIAGNOSIS — L568 Other specified acute skin changes due to ultraviolet radiation: Secondary | ICD-10-CM | POA: Diagnosis not present

## 2020-08-17 DIAGNOSIS — L821 Other seborrheic keratosis: Secondary | ICD-10-CM | POA: Diagnosis not present

## 2020-08-18 ENCOUNTER — Ambulatory Visit: Payer: Medicare Other

## 2020-09-21 ENCOUNTER — Other Ambulatory Visit: Payer: Self-pay

## 2020-09-21 ENCOUNTER — Ambulatory Visit (INDEPENDENT_AMBULATORY_CARE_PROVIDER_SITE_OTHER): Payer: Medicare Other | Admitting: Family Medicine

## 2020-09-21 ENCOUNTER — Ambulatory Visit: Payer: Medicare Other | Admitting: Family Medicine

## 2020-09-21 ENCOUNTER — Encounter: Payer: Self-pay | Admitting: Family Medicine

## 2020-09-21 VITALS — BP 100/73 | HR 60 | Temp 97.9°F | Resp 16 | Ht 63.0 in | Wt 113.0 lb

## 2020-09-21 DIAGNOSIS — D171 Benign lipomatous neoplasm of skin and subcutaneous tissue of trunk: Secondary | ICD-10-CM | POA: Diagnosis not present

## 2020-09-21 DIAGNOSIS — M5137 Other intervertebral disc degeneration, lumbosacral region: Secondary | ICD-10-CM | POA: Diagnosis not present

## 2020-09-21 DIAGNOSIS — Z23 Encounter for immunization: Secondary | ICD-10-CM

## 2020-09-21 DIAGNOSIS — R1310 Dysphagia, unspecified: Secondary | ICD-10-CM | POA: Diagnosis not present

## 2020-09-21 NOTE — Progress Notes (Signed)
I,[name],acting as a scribe for [name], MD.,have documented all relevant documentation on the behalf of [name], MD,as directed by  [name], MD while in the presence of [name], MD.   Established patient visit   Patient: Alexis Medina   DOB: 05-21-1946   74 y.o. Female  MRN: [id number] Visit Date: 09/21/2020  Today's healthcare provider: [name], MD   Chief Complaint  Patient presents with  . Follow-up   Subjective    HPI  She continues to have some mild symptoms consistent with very mild dysphagia.  No weight loss or alarm symptoms.  She is on omeprazole daily. Chest wall pain From 08/16/2020-I really think this is a lipoma that is irritated because the patient rubs it.  We will follow-up on this in a month may not get the CT scan of his shoulder still bothers her.  She really has no associated symptoms.  Dysphagia, unspecified type From 08/16/2020-I think this is unrelated to the chest wall pain.Prilosec and continue the Pepcid.      Medications: Outpatient Medications Prior to Visit  Medication Sig  . alendronate (FOSAMAX) 70 MG tablet TAKE 1 TABLET EVERY 7 DAYS WITH A FULL GLASS OF WATER ON AN EMPTY STOMACH  . cholecalciferol (VITAMIN D) 1000 UNITS tablet Take 1,000 Units by mouth daily.  . Docosahexaenoic Acid (DHA OMEGA 3) 100 MG CAPS TAKE 2 EACH BY MOUTH DAILY.  Marland Kitchen estrogen, conjugated,-medroxyprogesterone (PREMPRO) 0.3-1.5 MG tablet Take 1 tablet by mouth daily.  . famotidine (PEPCID) 40 MG tablet TAKE 1 TABLET TWICE A DAY  . ibuprofen (ADVIL,MOTRIN) 800 MG tablet Take 1 tablet (800 mg total) by mouth every 8 (eight) hours as needed.  . Multiple Vitamin (MULTIVITAMIN) capsule Take 1 capsule by mouth daily.  Marland Kitchen omeprazole (PRILOSEC) 20 MG capsule TAKE 1 TABLET DAILY BY MOUTH 1/2 A HOUR BEFORE BREAKFAST.  . tazarotene (AVAGE) 0.1 % cream Apply topically at bedtime. For acne  . traMADol (ULTRAM) 50 MG tablet  Take 1 tablet (50 mg total) by mouth 2 (two) times daily as needed.  . doxycycline (VIBRA-TABS) 100 MG tablet Take 1 tablet (100 mg total) by mouth 2 (two) times daily. (Patient not taking: Reported on 07/11/2020)   No facility-administered medications prior to visit.    Review of Systems  Constitutional: Negative for appetite change, chills, fatigue and fever.  Respiratory: Negative for chest tightness and shortness of breath.   Cardiovascular: Negative for chest pain and palpitations.  Gastrointestinal: Negative for abdominal pain, nausea and vomiting.  Neurological: Negative for dizziness and weakness.       Objective    BP 100/73 (BP Location: Left Arm, Patient Position: Sitting, Cuff Size: Normal)   Pulse 60   Temp 97.9 F (36.6 C) (Oral)   Resp 16   Ht 5\' 3"  (1.6 m)   Wt 113 lb (51.3 kg)   SpO2 100%   BMI 20.02 kg/m  BP Readings from Last 3 Encounters:  09/21/20 100/73  08/16/20 114/66  07/11/20 129/74   Wt Readings from Last 3 Encounters:  09/21/20 113 lb (51.3 kg)  08/16/20 113 lb (51.3 kg)  07/11/20 116 lb 11.2 oz (52.9 kg)      Physical Exam Vitals and nursing note reviewed. Exam conducted with a chaperone present.  Constitutional:      Appearance: Normal appearance.  HENT:     Head:     Jaw: There is normal jaw occlusion.  Right Ear: Tympanic membrane normal.     Left Ear: Tympanic membrane normal.     Mouth/Throat:     Mouth: Mucous membranes are moist.     Pharynx: Oropharynx is clear.  Eyes:     Conjunctiva/sclera: Conjunctivae normal.  Cardiovascular:     Rate and Rhythm: Normal rate and regular rhythm.     Pulses: Normal pulses.     Heart sounds: Normal heart sounds.     Comments: Chest wall is slightly tender in the lower part of the sternum with an area about 3 cm in length appears to be slightly tender lipoma in the lower chest wall just above the distal sternum. Pulmonary:     Effort: Pulmonary effort is normal.     Breath sounds:  Normal breath sounds.  Chest:     Breasts:        Right: Normal.        Left: Normal.  Abdominal:     General: Abdomen is flat. Bowel sounds are normal.     Palpations: Abdomen is soft.  Musculoskeletal:     Cervical back: Normal range of motion and neck supple.  Skin:    General: Skin is warm and dry.  Neurological:     General: No focal deficit present.     Mental Status: She is alert and oriented to person, place, and time.  Psychiatric:        Attention and Perception: Attention and perception normal.        Mood and Affect: Mood normal.        Speech: Speech normal.        Behavior: Behavior normal. Behavior is cooperative.        Thought Content: Thought content normal.        Cognition and Memory: Cognition and memory normal.        Judgment: Judgment normal.       No results found for any visits on 09/21/20.  Assessment & Plan     1. Dysphagia, unspecified type Refer to GI.  Patient does continue to be bothered by the symptoms. - Ambulatory referral to Gastroenterology  2. Encounter for immunization  - [employer] Vaccine  3. Lipoma of torso I think she has a lipoma over her lower sternum.  No work-up and less GI thinks that CT scan will be appropriate.  4. DDD (degenerative disc disease), lumbosacral    Return in about 6 months (around 03/22/2021).         Harue Pribble Cranford Mon, MD  [facility] [phone] (phone) [phone] (fax)  [place]

## 2020-10-04 ENCOUNTER — Ambulatory Visit: Payer: Self-pay | Admitting: Family Medicine

## 2020-10-16 ENCOUNTER — Other Ambulatory Visit: Payer: Medicare Other

## 2020-10-16 DIAGNOSIS — Z20822 Contact with and (suspected) exposure to covid-19: Secondary | ICD-10-CM

## 2020-10-18 LAB — SARS-COV-2, NAA 2 DAY TAT

## 2020-10-18 LAB — NOVEL CORONAVIRUS, NAA: SARS-CoV-2, NAA: DETECTED — AB

## 2020-10-23 ENCOUNTER — Other Ambulatory Visit: Payer: Self-pay | Admitting: Family Medicine

## 2020-10-23 DIAGNOSIS — M5137 Other intervertebral disc degeneration, lumbosacral region: Secondary | ICD-10-CM

## 2020-10-23 NOTE — Telephone Encounter (Signed)
[facility] faxed refill request for the following medications:  traMADol (ULTRAM) 50 MG tablet   Please advise.  

## 2020-10-25 MED ORDER — TRAMADOL HCL 50 MG PO TABS: 50.0000 mg | ORAL_TABLET | Freq: Two times a day (BID) | ORAL | 5 refills | Status: DC | PRN

## 2020-10-27 ENCOUNTER — Ambulatory Visit (INDEPENDENT_AMBULATORY_CARE_PROVIDER_SITE_OTHER): Payer: Medicare Other | Admitting: Gastroenterology

## 2020-10-27 ENCOUNTER — Encounter: Payer: Self-pay | Admitting: Gastroenterology

## 2020-10-27 VITALS — BP 120/60 | HR 64 | Ht 62.5 in | Wt 110.1 lb

## 2020-10-27 DIAGNOSIS — R1310 Dysphagia, unspecified: Secondary | ICD-10-CM

## 2020-10-27 DIAGNOSIS — R0789 Other chest pain: Secondary | ICD-10-CM | POA: Diagnosis not present

## 2020-10-27 DIAGNOSIS — K219 Gastro-esophageal reflux disease without esophagitis: Secondary | ICD-10-CM | POA: Diagnosis not present

## 2020-10-27 DIAGNOSIS — R6881 Early satiety: Secondary | ICD-10-CM | POA: Diagnosis not present

## 2020-10-27 MED ORDER — FAMOTIDINE 20 MG PO TABS
20.0000 mg | ORAL_TABLET | Freq: Two times a day (BID) | ORAL | 1 refills | Status: DC
Start: 2020-10-27 — End: 2021-04-09

## 2020-10-27 NOTE — Progress Notes (Signed)
HPI :  75 year old female with a history of hyperlipidemia, osteopenia, chest wall pain, GERD, referred by Dr. Miguel Aschoff, MD for chest wall pain, history of reflux, difficulty swallowing.  Patient states she has developed a lump along her chest wall about 6 months ago.  It is soft but tender at times and she notices herself pushing on it.  Seems to be stable in size since she has noticed it, does not appear to be growing.  She states she was told this is probably a benign lipoma.  Interestingly during this time over the past 6 months she feels that she needs to sit up straight when she swallows, otherwise " things do not go down well".  She denies a focal point of dysphagia and swallows fine if she is sitting straight up, however if leaning over this will bother her eating.  She is also noticed some early satiety in the past few months and her appetite is down in general.  She thinks she may have lost about 6 pounds over this timeframe.  She denies any odynophagia.  She states pills can get caught occasionally when she swallows although that is a longstanding issue.    She denies any obvious pyrosis or regurgitation that bothers her, however states she was developing chest pains that were bothering her at nighttime during her sleep a year or 2 ago.  She was placed on Pepcid and this resolved those symptoms so she has continued it since that time.  She states she was on Prilosec for about 3 weeks given her recent symptoms and states that did not make any change.  Given her osteopenia she did not want to be on long-term PPI.  She has been on Pepcid 40 mg twice daily for the past year or so.  She denies any nausea or vomiting otherwise.  No abdominal pains.  She denies any changes in her bowels or blood in her stools.  She has never had a prior EGD.  No family history of colon cancer, esophageal cancer, or stomach cancer.  She denies any NSAID use routinely.  She thinks she had a colonoscopy in 2014 in  [place], cannot recall the results, thinks it was normal but does have remote history of polyps being removed.  Of note she tested positive for COVID on 12/27.  She states she had a mild course and felt mostly fatigue with some rhinorrhea but nothing significant.  She is fully recovered and is asymptomatic.   Past Medical History:  Diagnosis Date  . Concussion   . GERD (gastroesophageal reflux disease)   . Hormone replacement therapy   . Hyperlipidemia   . Osteopenia      Past Surgical History:  Procedure Laterality Date  . TONSILLECTOMY    . TUBAL LIGATION     Family History  Problem Relation Age of Onset  . Heart disease Father   . Alzheimer's disease Mother   . Heart disease Mother   . Heart disease Brother   . Hypertension Brother   . Breast cancer Cousin   . Healthy Son    Social History   Tobacco Use  . Smoking status: Never Smoker  . Smokeless tobacco: Never Used  Vaping Use  . Vaping Use: Never used  Substance Use Topics  . Alcohol use: No  . Drug use: No   Current Outpatient Medications  Medication Sig Dispense Refill  . alendronate (FOSAMAX) 70 MG tablet TAKE 1 TABLET EVERY 7 DAYS WITH A FULL GLASS  OF WATER ON AN EMPTY STOMACH 12 tablet 3  . cholecalciferol (VITAMIN D) 1000 UNITS tablet Take 1,000 Units by mouth daily.    . Docosahexaenoic Acid (DHA OMEGA 3) 100 MG CAPS TAKE 2 EACH BY MOUTH DAILY. 60 capsule 12  . estrogen, conjugated,-medroxyprogesterone (PREMPRO) 0.3-1.5 MG tablet Take 1 tablet by mouth daily. 90 tablet 3  . famotidine (PEPCID) 20 MG tablet Take 1 tablet (20 mg total) by mouth 2 (two) times daily. 180 tablet 1  . ibuprofen (ADVIL,MOTRIN) 800 MG tablet Take 1 tablet (800 mg total) by mouth every 8 (eight) hours as needed. 90 tablet 3  . Multiple Vitamin (MULTIVITAMIN) capsule Take 1 capsule by mouth daily.    . tazarotene (AVAGE) 0.1 % cream Apply topically at bedtime. For acne    . traMADol (ULTRAM) 50 MG tablet Take 1 tablet (50 mg  total) by mouth 2 (two) times daily as needed. 135 tablet 5   No current facility-administered medications for this visit.   No Known Allergies   Review of Systems: All systems reviewed and negative except where noted in HPI.   Lab Results  Component Value Date   WBC 6.0 04/04/2020   HGB 13.1 04/04/2020   HCT 38.6 04/04/2020   MCV 91 04/04/2020   PLT 232 04/04/2020    Lab Results  Component Value Date   CREATININE 0.83 04/04/2020   BUN 10 04/04/2020   NA 137 04/04/2020   K 4.7 04/04/2020   CL 101 04/04/2020   CO2 20 04/04/2020    Lab Results  Component Value Date   ALT 12 04/04/2020   AST 26 04/04/2020   ALKPHOS 42 (L) 04/04/2020   BILITOT 0.7 04/04/2020     Physical Exam: BP 120/60 (BP Location: Left Arm, Patient Position: Sitting, Cuff Size: Normal)   Pulse 64   Ht 5' 2.5" (1.588 m)   Wt 110 lb 2 oz (50 kg)   BMI 19.82 kg/m  Constitutional: Pleasant,well-developed, female in no acute distress. HEENT: Normocephalic and atraumatic. Conjunctivae are normal. No scleral icterus. Neck supple.  Cardiovascular: Normal rate, regular rhythm.  Pulmonary/chest: Effort normal and breath sounds normal.  Chest wall - CMA Tia Alert as standby - small soft tissue nodule on distal sternum - ? lipoma, tenderness to palpation Abdominal: Soft, nondistended, nontender. There are no masses palpable.  Extremities: no edema Lymphadenopathy: No cervical adenopathy noted. Neurological: Alert and oriented to person place and time. Skin: Skin is warm and dry. No rashes noted. Psychiatric: Normal mood and affect. Behavior is normal.   ASSESSMENT AND PLAN: 75 year old female here for new patient assessment of the following:  GERD / Altered swallowing / Early satiety - as above, the patient has had chest discomfort bothering her mostly at nighttime and resolved with Pepcid over the past year or 2.  She denies any more typical pyrosis or regurgitation that bothers her.  She has had  some altered swallowing since she has developed this soft tissue nodule on her distal sternum over the past 6 months.  If she sits straight up she has no problems but does endorse some dysphagia if not sitting completely upright.  She has also had some early satiety, decreased appetite, and about 6 pounds of weight loss in recent months.  We discussed her symptoms.  The chest wall nodule seems small and would be unlikely to be related to her esophagus based on her exam today, and while this appears tender to the touch, unclear why that would be  associated with swallowing difficulty.  In light of her history of reflux, change in swallowing, early satiety, I am recommending an upper endoscopy to clear her upper GI tract and further evaluate the symptoms.  We discussed EGD and anesthesia, risk benefits of each, and she wanted to proceed.  Further recommendations pending that result.  In the interim agree that we should avoid PPIs if possible given her osteopenia and increased risk for fracture.  Using Pepcid monotherapy is reasonable as long as that continues to control her symptoms.  I do think we should try to reduce her dose and see if a lower dose of Pepcid is able to provide long-term control, and the slight we will switch to 20 mg twice daily.  She was agreeable to this.  Further recommendations pending her course and results.  Chest wall tenderness / nodule - as above, this appears to be a small benign lesion at least on exam, unclear however how  this would be related to her swallowing change.  We will await her EGD first.  If negative may consider ultrasound of the chest wall to clarify if this is a lipoma or CT scan of the chest.  She agreed  Of note, we will reach out to [place] office to get records from her last colonoscopy to clarify findings.  She agreed  Gate Cellar, MD [facility]  CC: Jerrol Banana.,*

## 2020-10-27 NOTE — Patient Instructions (Addendum)
If you are age 75 or older, your body mass index should be between 23-30. Your Body mass index is 19.82 kg/m. If this is out of the aforementioned range listed, please consider follow up with your Primary Care Provider.  If you are age 23 or younger, your body mass index should be between 19-25. Your Body mass index is 19.82 kg/m. If this is out of the aformentioned range listed, please consider follow up with your Primary Care Provider.   You have been scheduled for an endoscopy. Please follow written instructions given to you at your visit today. If you use inhalers (even only as needed), please bring them with you on the day of your procedure.   We have sent the following medications to your pharmacy for you to pick up at your convenience:: Decrease to Pepcid 20 mg twice a day.   We will request your colonoscopy report from Dr. Allen Norris of [place].    Thank you for entrusting me with your care and for choosing [facility], Dr. Mills Cellar

## 2020-11-01 ENCOUNTER — Other Ambulatory Visit: Payer: Self-pay

## 2020-11-01 ENCOUNTER — Other Ambulatory Visit: Payer: Self-pay | Admitting: Gastroenterology

## 2020-11-01 ENCOUNTER — Encounter: Payer: Self-pay | Admitting: Gastroenterology

## 2020-11-01 ENCOUNTER — Ambulatory Visit (AMBULATORY_SURGERY_CENTER): Payer: Medicare Other | Admitting: Gastroenterology

## 2020-11-01 VITALS — BP 134/69 | HR 48 | Temp 97.7°F | Resp 16 | Ht 62.0 in | Wt 110.0 lb

## 2020-11-01 DIAGNOSIS — K219 Gastro-esophageal reflux disease without esophagitis: Secondary | ICD-10-CM

## 2020-11-01 DIAGNOSIS — K2970 Gastritis, unspecified, without bleeding: Secondary | ICD-10-CM | POA: Diagnosis not present

## 2020-11-01 DIAGNOSIS — R1310 Dysphagia, unspecified: Secondary | ICD-10-CM | POA: Diagnosis not present

## 2020-11-01 DIAGNOSIS — K2950 Unspecified chronic gastritis without bleeding: Secondary | ICD-10-CM | POA: Diagnosis not present

## 2020-11-01 DIAGNOSIS — R6881 Early satiety: Secondary | ICD-10-CM

## 2020-11-01 MED ORDER — SODIUM CHLORIDE 0.9 % IV SOLN
500.0000 mL | INTRAVENOUS | Status: DC
Start: 2020-11-01 — End: 2020-11-01

## 2020-11-01 NOTE — Progress Notes (Signed)
V/s SH  

## 2020-11-01 NOTE — Progress Notes (Signed)
1558 Robinul 0.1 mg IV given due large amount of secretions upon assessment.  MD made aware, vss  

## 2020-11-01 NOTE — Progress Notes (Unsigned)
Called to room to assist during endoscopic procedure.  Patient ID and intended procedure confirmed with present staff. Received instructions for my participation in the procedure from the performing physician.  

## 2020-11-01 NOTE — Op Note (Signed)
[name] Patient Name: Alexis Medina Procedure Date: 11/01/2020 3:54 PM MRN: [id number] Endoscopist: Remo Lipps P. Havery Moros , MD Age: 75 Referring MD:  Date of Birth: [birth year] Gender: Female Account #: [id number] Procedure:                Upper GI endoscopy Indications:              Dysphagia, Follow-up of gastro-esophageal reflux                            disease - on pepcid twice daily, Early satiety Medicines:                Monitored Anesthesia Care Procedure:                Pre-Anesthesia Assessment:                           - Prior to the procedure, a History and Physical                            was performed, and patient medications and                            allergies were reviewed. The patient's tolerance of                            previous anesthesia was also reviewed. The risks                            and benefits of the procedure and the sedation                            options and risks were discussed with the patient.                            All questions were answered, and informed consent                            was obtained. Prior Anticoagulants: The patient has                            taken no previous anticoagulant or antiplatelet                            agents. ASA Grade Assessment: II - A patient with                            mild systemic disease. After reviewing the risks                            and benefits, the patient was deemed in                            satisfactory condition to undergo the procedure.  After obtaining informed consent, the endoscope was                            passed under direct vision. Throughout the                            procedure, the patient's blood pressure, pulse, and                            oxygen saturations were monitored continuously. The                            Endoscope was introduced through the mouth, and                             advanced to the second part of duodenum. The upper                            GI endoscopy was accomplished without difficulty.                            The patient tolerated the procedure well. Scope In: Scope Out: Findings:                 Esophagogastric landmarks were identified: the                            Z-line was found at 37 cm, the gastroesophageal                            junction was found at 37 cm and the upper extent of                            the gastric folds was found at 37 cm from the                            incisors.                           The exam of the esophagus was otherwise normal. No                            obvious stenosis / stricture appreciated. No                            inflammatory changes.                           A guidewire was placed and the scope was withdrawn.                            Empiric dilation was performed in the entire  esophagus with a Savary dilator with mild                            resistance at 17 mm. Relook endoscopy showed no                            mucosal wrents.                           The entire examined stomach was normal. Biopsies                            were taken with a cold forceps for Helicobacter                            pylori testing.                           The duodenal bulb and second portion of the                            duodenum were normal. Complications:            No immediate complications. Estimated blood loss:                            Minimal. Estimated Blood Loss:     Estimated blood loss was minimal. Impression:               - Esophagogastric landmarks identified.                           - Normal esophagus otherwise - empiric dilation                            performed to 66mm as above.                           - Normal stomach. Biopsied to rule out H pylori.                           - Normal duodenal bulb and second portion of the                             duodenum. Recommendation:           - Patient has a contact number available for                            emergencies. The signs and symptoms of potential                            delayed complications were discussed with the                            patient. Return to normal activities tomorrow.  Written discharge instructions were provided to the                            patient.                           - Resume previous diet, await course post dilation.                           - Continue present medications - pepcid twice daily.                           - Await pathology results with further                            recommendations. Remo Lipps P. Yesika Rispoli, MD 11/01/2020 4:23:10 PM This report has been signed electronically.

## 2020-11-01 NOTE — Progress Notes (Signed)
Report given to PACU, vss 

## 2020-11-01 NOTE — Patient Instructions (Signed)
Thank you for allowing [place] to care for you today.  Resume medications today.  Continue Pepcid twice daily.  Resume previous diet tomorrow.  Soft diet for the remainder of the day.  Handout given.  Resume normal activities tomorrow.  Await biopsy results, approximately one week.  We will reach out to discuss findings.    YOU HAD AN ENDOSCOPIC PROCEDURE TODAY AT [facility]:   Refer to the procedure report that was given to you for any specific questions about what was found during the examination.  If the procedure report does not answer your questions, please call your gastroenterologist to clarify.  If you requested that your care partner not be given the details of your procedure findings, then the procedure report has been included in a sealed envelope for you to review at your convenience later.  YOU SHOULD EXPECT: Some feelings of bloating in the abdomen. Passage of more gas than usual.  Walking can help get rid of the air that was put into your GI tract during the procedure and reduce the bloating. If you had a lower endoscopy (such as a colonoscopy or flexible sigmoidoscopy) you may notice spotting of blood in your stool or on the toilet paper. If you underwent a bowel prep for your procedure, you may not have a normal bowel movement for a few days.  Please Note:  You might notice some irritation and congestion in your nose or some drainage.  This is from the oxygen used during your procedure.  There is no need for concern and it should clear up in a day or so.  SYMPTOMS TO REPORT IMMEDIATELY:    Following upper endoscopy (EGD)  Vomiting of blood or coffee ground material  New chest pain or pain under the shoulder blades  Painful or persistently difficult swallowing  New shortness of breath  Fever of 100F or higher  Black, tarry-looking stools  For urgent or emergent issues, a gastroenterologist can be reached at any hour by calling [phone]. Do not use  MyChart messaging for urgent concerns.    DIET:  We do recommend a small meal at first, but then you may proceed to your regular diet.  Drink plenty of fluids but you should avoid alcoholic beverages for 24 hours.  ACTIVITY:  You should plan to take it easy for the rest of today and you should NOT DRIVE or use heavy machinery until tomorrow (because of the sedation medicines used during the test).    FOLLOW UP: Our staff will call the number listed on your records 48-72 hours following your procedure to check on you and address any questions or concerns that you may have regarding the information given to you following your procedure. If we do not reach you, we will leave a message.  We will attempt to reach you two times.  During this call, we will ask if you have developed any symptoms of COVID 19. If you develop any symptoms (ie: fever, flu-like symptoms, shortness of breath, cough etc.) before then, please call [phone].  If you test positive for Covid 19 in the 2 weeks post procedure, please call and report this information to [place].    If any biopsies were taken you will be contacted by phone or by letter within the next 1-3 weeks.  Please call us at [phone] if you have not heard about the biopsies in 3 weeks.    SIGNATURES/CONFIDENTIALITY: You and/or your care partner have signed paperwork which will  be entered into your electronic medical record.  These signatures attest to the fact that that the information above on your After Visit Summary has been reviewed and is understood.  Full responsibility of the confidentiality of this discharge information lies with you and/or your care-partner. 

## 2020-11-02 ENCOUNTER — Encounter: Payer: Self-pay | Admitting: Family Medicine

## 2020-11-02 ENCOUNTER — Ambulatory Visit (INDEPENDENT_AMBULATORY_CARE_PROVIDER_SITE_OTHER): Payer: Medicare Other | Admitting: Family Medicine

## 2020-11-02 ENCOUNTER — Other Ambulatory Visit: Payer: Self-pay

## 2020-11-02 ENCOUNTER — Ambulatory Visit
Admission: RE | Admit: 2020-11-02 | Discharge: 2020-11-02 | Disposition: A | Discharge status: Home / Self Care | Payer: Medicare Other | Attending: Family Medicine | Admitting: Family Medicine

## 2020-11-02 ENCOUNTER — Ambulatory Visit
Admission: RE | Admit: 2020-11-02 | Discharge: 2020-11-02 | Disposition: A | Discharge status: Home / Self Care | Payer: Medicare Other | Source: Ambulatory Visit | Attending: Family Medicine | Admitting: Family Medicine

## 2020-11-02 VITALS — BP 128/81 | HR 78 | Temp 99.6°F | Resp 16 | Wt 111.2 lb

## 2020-11-02 DIAGNOSIS — M79671 Pain in right foot: Secondary | ICD-10-CM

## 2020-11-02 DIAGNOSIS — M25559 Pain in unspecified hip: Secondary | ICD-10-CM | POA: Diagnosis not present

## 2020-11-02 DIAGNOSIS — M5440 Lumbago with sciatica, unspecified side: Secondary | ICD-10-CM

## 2020-11-02 DIAGNOSIS — M25551 Pain in right hip: Secondary | ICD-10-CM | POA: Diagnosis not present

## 2020-11-02 DIAGNOSIS — M7989 Other specified soft tissue disorders: Secondary | ICD-10-CM | POA: Diagnosis not present

## 2020-11-02 DIAGNOSIS — M545 Low back pain, unspecified: Secondary | ICD-10-CM | POA: Diagnosis not present

## 2020-11-02 DIAGNOSIS — M25552 Pain in left hip: Secondary | ICD-10-CM | POA: Diagnosis not present

## 2020-11-02 MED ORDER — CYCLOBENZAPRINE HCL 5 MG PO TABS: 5.0000 mg | ORAL_TABLET | Freq: Every day | ORAL | 1 refills | Status: DC

## 2020-11-02 NOTE — Progress Notes (Signed)
Established patient visit   Patient: Alexis Medina   DOB: 28-Jan-1946   75 y.o. Female  MRN: [id number] Visit Date: 11/02/2020  Today's healthcare provider: [name], MD   Chief Complaint  Patient presents with  . Back Pain  . Hip Pain   Subjective    Back Pain This is a chronic (2 months ago) problem. The problem has been gradually worsening since onset. The pain is present in the lumbar spine (worse on left side). The quality of the pain is described as shooting. Radiates to: occasionally radiates down her leg. The pain is severe. The symptoms are aggravated by sitting. Stiffness is present in the morning. Pertinent negatives include no abdominal pain, chest pain, fever or weakness. Treatments tried: Tramadol and Tylenol.  Hip Pain  Incident onset: 2 months ago. There was no injury mechanism. The pain is present in the left hip and right hip. The pain has been worsening since onset. Exacerbated by: sitting and laying down. She has tried acetaminophen (also Tramadol) for the symptoms. The treatment provided mild relief.    Patient complains of early morning stiffness in her lower back.  She states it takes an hour for her to stand up straight in the morning.  She also complains of chronic right foot pain.  Seems to go into her right hip.      Medications: Outpatient Medications Prior to Visit  Medication Sig  . alendronate (FOSAMAX) 70 MG tablet TAKE 1 TABLET EVERY 7 DAYS WITH A FULL GLASS OF WATER ON AN EMPTY STOMACH  . cholecalciferol (VITAMIN D) 1000 UNITS tablet Take 1,000 Units by mouth daily.  . Docosahexaenoic Acid (DHA OMEGA 3) 100 MG CAPS TAKE 2 EACH BY MOUTH DAILY.  Marland Kitchen estrogen, conjugated,-medroxyprogesterone (PREMPRO) 0.3-1.5 MG tablet Take 1 tablet by mouth daily.  . famotidine (PEPCID) 20 MG tablet Take 1 tablet (20 mg total) by mouth 2 (two) times daily.  Marland Kitchen ibuprofen (ADVIL,MOTRIN) 800 MG tablet Take 1 tablet (800 mg total) by mouth every 8 (eight)  hours as needed.  . Multiple Vitamin (MULTIVITAMIN) capsule Take 1 capsule by mouth daily.  . RESTASIS 0.05 % ophthalmic emulsion   . tazarotene (AVAGE) 0.1 % cream Apply topically at bedtime. For acne  . traMADol (ULTRAM) 50 MG tablet Take 1 tablet (50 mg total) by mouth 2 (two) times daily as needed.   No facility-administered medications prior to visit.    Review of Systems  Constitutional: Negative for appetite change, chills, fatigue and fever.  Respiratory: Negative for chest tightness and shortness of breath.   Cardiovascular: Negative for chest pain and palpitations.  Gastrointestinal: Negative for abdominal pain, nausea and vomiting.  Musculoskeletal: Positive for back pain.  Neurological: Negative for dizziness and weakness.       Objective    BP 128/81 (BP Location: Left Arm, Patient Position: Sitting, Cuff Size: Normal)   Pulse 78   Temp 99.6 F (37.6 C) (Temporal)   Resp 16   Wt 111 lb 3.2 oz (50.4 kg)   BMI 20.34 kg/m  BP Readings from Last 3 Encounters:  11/02/20 128/81  11/01/20 134/69  10/27/20 120/60   Wt Readings from Last 3 Encounters:  11/02/20 111 lb 3.2 oz (50.4 kg)  11/01/20 110 lb (49.9 kg)  10/27/20 110 lb 2 oz (50 kg)      Physical Exam Vitals reviewed.  Constitutional:      Appearance: Normal appearance.  HENT:     Right Ear:  External ear normal.     Left Ear: External ear normal.     Nose: Nose normal.     Mouth/Throat:     Pharynx: Oropharynx is clear.  Eyes:     Conjunctiva/sclera: Conjunctivae normal.  Cardiovascular:     Rate and Rhythm: Normal rate.     Heart sounds: Normal heart sounds.  Pulmonary:     Effort: Pulmonary effort is normal.  Abdominal:     Palpations: Abdomen is soft.  Musculoskeletal:     Cervical back: No muscular tenderness.     Comments: Some scoliosis noted of thoracic or lumbar spine.  Neurological:     General: No focal deficit present.     Mental Status: She is alert and oriented to person,  place, and time. Mental status is at baseline.     Cranial Nerves: No cranial nerve deficit.     Motor: No weakness.     Coordination: Coordination normal.     Gait: Gait normal.  Psychiatric:        Mood and Affect: Mood normal.        Thought Content: Thought content normal.        Judgment: Judgment normal.       No results found for any visits on 11/02/20.  Assessment & Plan     1. Right foot pain Obtain x-ray.  May need podiatry referral. - DG Foot Complete Right; Future - Sed Rate (ESR) - C-reactive protein  2. Low back pain with sciatica, sciatica laterality unspecified, unspecified back pain laterality, unspecified chronicity Patient has scoliosis which is probably the source of her pain.  Could be polymyalgia rheumatica.  At this time try turmeric daily and cyclobenzaprine at bedtime.  Follow-up in 1 to 2 months.  Consider physical therapy referral. - DG Lumbar Spine Complete; Future - cyclobenzaprine (FLEXERIL) 5 MG tablet; Take 1 tablet (5 mg total) by mouth at bedtime.  Dispense: 30 tablet; Refill: 1 - Sed Rate (ESR) - C-reactive protein  3. Hip pain Consider orthopedic referral. - cyclobenzaprine (FLEXERIL) 5 MG tablet; Take 1 tablet (5 mg total) by mouth at bedtime.  Dispense: 30 tablet; Refill: 1 - Sed Rate (ESR) - C-reactive protein - DG HIP UNILAT W OR W/O PELVIS 2-3 VIEWS LEFT; Future - DG HIP UNILAT W OR W/O PELVIS 2-3 VIEWS RIGHT; Future  Return in about 1 month (around 12/03/2020).      I, [name], MD, have reviewed all documentation for this visit. The documentation on 11/11/20 for the exam, diagnosis, procedures, and orders are all accurate and complete.    Richard Cranford Mon, MD  [facility] [phone] (phone) [phone] (fax)  [place]

## 2020-11-02 NOTE — Patient Instructions (Signed)
Recommend trying Turmeric daily

## 2020-11-03 ENCOUNTER — Telehealth: Payer: Self-pay | Admitting: *Deleted

## 2020-11-03 LAB — SEDIMENTATION RATE: Sed Rate: 34 mm/hr (ref 0–40)

## 2020-11-03 LAB — C-REACTIVE PROTEIN: CRP: <1 mg/L (ref 0–10)

## 2020-11-03 NOTE — Telephone Encounter (Signed)
1. Have you developed a fever since your procedure? no  2.   Have you had an respiratory symptoms (SOB or cough) since your procedure? no  3.   Have you tested positive for COVID 19 since your procedure no  4.   Have you had any family members/close contacts diagnosed with the COVID 19 since your procedure?  no   If yes to any of these questions please route to Joylene John, RN and Joella Prince, RN Follow up Call-  Call back number 11/01/2020  Post procedure Call Back phone  # [phone]  Permission to leave phone message Yes  Some recent data might be hidden     Patient questions:  Do you have a fever, pain , or abdominal swelling? No. Pain Score  0 *  Have you tolerated food without any problems? Yes.    Have you been able to return to your normal activities? Yes.    Do you have any questions about your discharge instructions: Diet   No. Medications  No. Follow up visit  No.  Do you have questions or concerns about your Care? No.  Actions: * If pain score is 4 or above: No action needed, pain <4.

## 2020-11-07 ENCOUNTER — Ambulatory Visit: Payer: Medicare Other | Admitting: Family Medicine

## 2020-11-15 ENCOUNTER — Other Ambulatory Visit: Payer: Self-pay

## 2020-11-15 DIAGNOSIS — R0789 Other chest pain: Secondary | ICD-10-CM

## 2020-11-15 DIAGNOSIS — R0782 Intercostal pain: Secondary | ICD-10-CM

## 2020-11-15 DIAGNOSIS — Z8619 Personal history of other infectious and parasitic diseases: Secondary | ICD-10-CM

## 2020-11-21 ENCOUNTER — Encounter: Payer: Self-pay | Admitting: Family Medicine

## 2020-11-21 ENCOUNTER — Other Ambulatory Visit: Payer: Self-pay

## 2020-11-21 ENCOUNTER — Ambulatory Visit (INDEPENDENT_AMBULATORY_CARE_PROVIDER_SITE_OTHER): Payer: Medicare Other | Admitting: Family Medicine

## 2020-11-21 VITALS — BP 112/73 | HR 77 | Temp 98.0°F | Resp 16 | Ht 62.0 in | Wt 110.0 lb

## 2020-11-21 DIAGNOSIS — M5440 Lumbago with sciatica, unspecified side: Secondary | ICD-10-CM | POA: Diagnosis not present

## 2020-11-21 DIAGNOSIS — M5137 Other intervertebral disc degeneration, lumbosacral region: Secondary | ICD-10-CM | POA: Diagnosis not present

## 2020-11-21 MED ORDER — PREDNISONE 20 MG PO TABS
20.0000 mg | ORAL_TABLET | Freq: Every day | ORAL | 0 refills | Status: AC
Start: 2020-11-21 — End: 2020-11-26

## 2020-11-21 NOTE — Progress Notes (Signed)
Established patient visit   Patient: Alexis Medina   DOB: 03/03/1946   75 y.o. Female  MRN: [id number] Visit Date: 11/21/2020  Today's healthcare provider: [name], MD   Chief Complaint  Patient presents with  . Back Pain   Subjective    HPI  Patient has continuing low back pain.  Lower back and goes down the left greater than right leg.  She wishes to see a specialist for this. Low back pain with sciatica, sciatica laterality unspecified, unspecified back pain laterality, unspecified chronicity From 11/02/20-Patient has scoliosis which is probably the source of her pain.  Could be polymyalgia rheumatica.  At this time try turmeric daily and cyclobenzaprine at bedtime.  Follow-up in 1 to 2 months.  Consider physical therapy referral. X-ray of Lumbar Spine obtained. Patient reports that the pain is worse going from sitting to standing position. She reports that the cyclobenzaprine has helped, but not much.     Hip pain From 11/02/2020-Consider orthopedic referral. X-ray of hips obtained.      Medications: Outpatient Medications Prior to Visit  Medication Sig  . cholecalciferol (VITAMIN D) 1000 UNITS tablet Take 1,000 Units by mouth daily.  . cyclobenzaprine (FLEXERIL) 5 MG tablet Take 1 tablet (5 mg total) by mouth at bedtime.  . Docosahexaenoic Acid (DHA OMEGA 3) 100 MG CAPS TAKE 2 EACH BY MOUTH DAILY.  Marland Kitchen estrogen, conjugated,-medroxyprogesterone (PREMPRO) 0.3-1.5 MG tablet Take 1 tablet by mouth daily.  . famotidine (PEPCID) 20 MG tablet Take 1 tablet (20 mg total) by mouth 2 (two) times daily.  Marland Kitchen ibuprofen (ADVIL,MOTRIN) 800 MG tablet Take 1 tablet (800 mg total) by mouth every 8 (eight) hours as needed.  . Multiple Vitamin (MULTIVITAMIN) capsule Take 1 capsule by mouth daily.  . RESTASIS 0.05 % ophthalmic emulsion   . tazarotene (AVAGE) 0.1 % cream Apply topically at bedtime. For acne  . traMADol (ULTRAM) 50 MG tablet Take 1 tablet (50 mg total) by mouth 2  (two) times daily as needed.  Marland Kitchen alendronate (FOSAMAX) 70 MG tablet TAKE 1 TABLET EVERY 7 DAYS WITH A FULL GLASS OF WATER ON AN EMPTY STOMACH   No facility-administered medications prior to visit.    Review of Systems  Constitutional: Negative.   Respiratory: Negative.   Cardiovascular: Negative.   Musculoskeletal: Positive for arthralgias and back pain.        Objective    BP 112/73   Pulse 77   Temp 98 F (36.7 C)   Resp 16   Ht 5\' 2"  (1.575 m)   Wt 110 lb (49.9 kg)   BMI 20.12 kg/m  Wt Readings from Last 3 Encounters:  11/21/20 110 lb (49.9 kg)  11/02/20 111 lb 3.2 oz (50.4 kg)  11/01/20 110 lb (49.9 kg)       Physical Exam Vitals reviewed.  Constitutional:      Appearance: Normal appearance.  HENT:     Right Ear: External ear normal.     Left Ear: External ear normal.     Nose: Nose normal.     Mouth/Throat:     Pharynx: Oropharynx is clear.  Eyes:     Conjunctiva/sclera: Conjunctivae normal.  Cardiovascular:     Rate and Rhythm: Normal rate.     Heart sounds: Normal heart sounds.  Pulmonary:     Effort: Pulmonary effort is normal.  Abdominal:     Palpations: Abdomen is soft.  Musculoskeletal:     Cervical back: No muscular  tenderness.     Comments: Some scoliosis noted of thoracic or lumbar spine.  Neurological:     General: No focal deficit present.     Mental Status: She is alert and oriented to person, place, and time.     Cranial Nerves: No cranial nerve deficit.     Motor: No weakness.     Coordination: Coordination normal.     Gait: Gait normal.     Comments: Straight leg raise is negative bilaterally and strength is normal.  DTRs are normal.  Psychiatric:        Mood and Affect: Mood normal.        Thought Content: Thought content normal.        Judgment: Judgment normal.       No results found for any visits on 11/21/20.  Assessment & Plan     1. Low back pain with sciatica, sciatica laterality unspecified, unspecified back pain  laterality, unspecified chronicity Try prednisone 20 mg daily for 5 days and refer to ENT Dr. Arnoldo Morale from neurosurgery. - Ambulatory referral to Neurosurgery - Ambulatory referral to Physical Therapy - MR Lumbar Spine W Wo Contrast; Future - predniSONE (DELTASONE) 20 MG tablet; Take 1 tablet (20 mg total) by mouth daily with breakfast for 5 days.  Dispense: 5 tablet; Refill: 0  2. DDD (degenerative disc disease), lumbosacral    No follow-ups on file.      I, [name], MD, have reviewed all documentation for this visit. The documentation on 11/24/20 for the exam, diagnosis, procedures, and orders are all accurate and complete.    Ellar Hakala Cranford Mon, MD  [facility] [phone] (phone) [phone] (fax)  [place]

## 2020-11-23 ENCOUNTER — Ambulatory Visit (HOSPITAL_COMMUNITY)
Admission: RE | Admit: 2020-11-23 | Discharge: 2020-11-23 | Disposition: A | Discharge status: Home / Self Care | Payer: Medicare Other | Source: Ambulatory Visit | Attending: Gastroenterology | Admitting: Gastroenterology

## 2020-11-23 ENCOUNTER — Other Ambulatory Visit: Payer: Self-pay

## 2020-11-23 DIAGNOSIS — R0789 Other chest pain: Secondary | ICD-10-CM | POA: Insufficient documentation

## 2020-11-23 DIAGNOSIS — R0782 Intercostal pain: Secondary | ICD-10-CM | POA: Insufficient documentation

## 2020-11-23 LAB — POCT I-STAT CREATININE: Creatinine, Ser: 0.7 mg/dL (ref 0.44–1.00)

## 2020-11-23 MED ORDER — IOHEXOL 300 MG/ML  SOLN
75.0000 mL | Freq: Once | INTRAMUSCULAR | Status: AC | PRN
  Administered 2020-11-23: 75 mL via INTRAVENOUS

## 2020-11-28 ENCOUNTER — Other Ambulatory Visit: Payer: Medicare Other

## 2020-11-28 DIAGNOSIS — Z8619 Personal history of other infectious and parasitic diseases: Secondary | ICD-10-CM | POA: Diagnosis not present

## 2020-11-30 LAB — H. PYLORI ANTIGEN, STOOL: H pylori Ag, Stl: POSITIVE — AB

## 2020-12-01 ENCOUNTER — Ambulatory Visit
Admission: RE | Admit: 2020-12-01 | Discharge: 2020-12-01 | Disposition: A | Discharge status: Home / Self Care | Payer: Medicare Other | Source: Ambulatory Visit | Attending: Family Medicine | Admitting: Family Medicine

## 2020-12-01 ENCOUNTER — Other Ambulatory Visit: Payer: Self-pay

## 2020-12-01 DIAGNOSIS — M5440 Lumbago with sciatica, unspecified side: Secondary | ICD-10-CM | POA: Insufficient documentation

## 2020-12-01 DIAGNOSIS — A048 Other specified bacterial intestinal infections: Secondary | ICD-10-CM

## 2020-12-01 DIAGNOSIS — M545 Low back pain, unspecified: Secondary | ICD-10-CM | POA: Diagnosis not present

## 2020-12-01 MED ORDER — GADOBUTROL 1 MMOL/ML IV SOLN
5.0000 mL | Freq: Once | INTRAVENOUS | Status: AC | PRN
  Administered 2020-12-01: 5 mL via INTRAVENOUS

## 2020-12-01 MED ORDER — METRONIDAZOLE 500 MG PO TABS: 500.0000 mg | ORAL_TABLET | Freq: Two times a day (BID) | ORAL | 0 refills | Status: AC

## 2020-12-01 MED ORDER — AMOXICILLIN 500 MG PO CAPS: 1000.0000 mg | ORAL_CAPSULE | Freq: Two times a day (BID) | ORAL | 0 refills | Status: AC

## 2020-12-01 MED ORDER — OMEPRAZOLE 40 MG PO CPDR: 40.0000 mg | DELAYED_RELEASE_CAPSULE | Freq: Two times a day (BID) | ORAL | 0 refills | Status: DC

## 2020-12-01 MED ORDER — CLARITHROMYCIN 500 MG PO TABS: 500.0000 mg | ORAL_TABLET | Freq: Two times a day (BID) | ORAL | 0 refills | Status: AC

## 2020-12-04 NOTE — Progress Notes (Deleted)
      Established patient visit   Patient: Alexis Medina   DOB: 1946-06-19   75 y.o. Female  MRN: [id number] Visit Date: 12/05/2020  Today's healthcare provider: [name], MD   No chief complaint on file.  Subjective    HPI  Low back pain with sciatica, sciatica laterality unspecified, unspecified back pain laterality, unspecified chronicity From 11/21/2020-Try prednisone 20 mg daily for 5 days and refer to Dr. Arnoldo Morale from neurosurgery. Referred to Physical Therapy. MRI was also obtained.   {Show patient history (optional):23778::" "}   Medications: Outpatient Medications Prior to Visit  Medication Sig  . alendronate (FOSAMAX) 70 MG tablet TAKE 1 TABLET EVERY 7 DAYS WITH A FULL GLASS OF WATER ON AN EMPTY STOMACH  . amoxicillin (AMOXIL) 500 MG capsule Take 2 capsules (1,000 mg total) by mouth 2 (two) times daily for 14 days.  . cholecalciferol (VITAMIN D) 1000 UNITS tablet Take 1,000 Units by mouth daily.  . clarithromycin (BIAXIN) 500 MG tablet Take 1 tablet (500 mg total) by mouth 2 (two) times daily for 14 days.  . cyclobenzaprine (FLEXERIL) 5 MG tablet Take 1 tablet (5 mg total) by mouth at bedtime.  . Docosahexaenoic Acid (DHA OMEGA 3) 100 MG CAPS TAKE 2 EACH BY MOUTH DAILY.  Marland Kitchen estrogen, conjugated,-medroxyprogesterone (PREMPRO) 0.3-1.5 MG tablet Take 1 tablet by mouth daily.  . famotidine (PEPCID) 20 MG tablet Take 1 tablet (20 mg total) by mouth 2 (two) times daily.  Marland Kitchen ibuprofen (ADVIL,MOTRIN) 800 MG tablet Take 1 tablet (800 mg total) by mouth every 8 (eight) hours as needed.  . metroNIDAZOLE (FLAGYL) 500 MG tablet Take 1 tablet (500 mg total) by mouth 2 (two) times daily for 14 days.  . Multiple Vitamin (MULTIVITAMIN) capsule Take 1 capsule by mouth daily.  Marland Kitchen omeprazole (PRILOSEC) 40 MG capsule Take 1 capsule (40 mg total) by mouth 2 (two) times daily for 14 days.  . RESTASIS 0.05 % ophthalmic emulsion   . tazarotene (AVAGE) 0.1 % cream Apply topically at  bedtime. For acne  . traMADol (ULTRAM) 50 MG tablet Take 1 tablet (50 mg total) by mouth 2 (two) times daily as needed.   No facility-administered medications prior to visit.    Review of Systems  Constitutional: Negative for appetite change, chills, fatigue and fever.  Respiratory: Negative for chest tightness and shortness of breath.   Cardiovascular: Negative for chest pain and palpitations.  Gastrointestinal: Negative for abdominal pain, nausea and vomiting.  Neurological: Negative for dizziness and weakness.    {Labs  Heme  Chem  Endocrine  Serology  Results Review (optional):23779::" "}   Objective    There were no vitals taken for this visit. {Show previous vital signs (optional):23777::" "}   Physical Exam  ***  No results found for any visits on 12/05/20.  Assessment & Plan     ***  No follow-ups on file.      {provider attestation***:1}   [name], MD  [facility] [phone] (phone) [phone] (fax)  [place]

## 2020-12-05 ENCOUNTER — Ambulatory Visit: Payer: Self-pay | Admitting: Family Medicine

## 2020-12-05 DIAGNOSIS — M48062 Spinal stenosis, lumbar region with neurogenic claudication: Secondary | ICD-10-CM | POA: Diagnosis not present

## 2020-12-05 DIAGNOSIS — M5116 Intervertebral disc disorders with radiculopathy, lumbar region: Secondary | ICD-10-CM | POA: Diagnosis not present

## 2020-12-05 DIAGNOSIS — M4316 Spondylolisthesis, lumbar region: Secondary | ICD-10-CM | POA: Diagnosis not present

## 2020-12-14 DIAGNOSIS — M722 Plantar fascial fibromatosis: Secondary | ICD-10-CM | POA: Diagnosis not present

## 2020-12-20 ENCOUNTER — Ambulatory Visit: Payer: Medicare Other | Admitting: Physical Therapy

## 2020-12-25 ENCOUNTER — Encounter: Payer: Medicare Other | Admitting: Physical Therapy

## 2020-12-25 ENCOUNTER — Telehealth: Payer: Self-pay | Admitting: Family Medicine

## 2020-12-27 ENCOUNTER — Encounter: Payer: Medicare Other | Admitting: Physical Therapy

## 2020-12-29 ENCOUNTER — Other Ambulatory Visit: Payer: Self-pay | Admitting: Family Medicine

## 2020-12-29 DIAGNOSIS — M25559 Pain in unspecified hip: Secondary | ICD-10-CM

## 2020-12-29 DIAGNOSIS — M5440 Lumbago with sciatica, unspecified side: Secondary | ICD-10-CM

## 2020-12-29 NOTE — Telephone Encounter (Signed)
Requested medication (s) are due for refill today: yes  Requested medication (s) are on the active medication list: yes  Last refill:  11/29/2020  Future visit scheduled: yes  Notes to clinic:  this refill cannot be delegated    Requested Prescriptions  Pending Prescriptions Disp Refills   cyclobenzaprine (FLEXERIL) 5 MG tablet [Pharmacy Med Name: CYCLOBENZAPRINE 5 MG TABLET] 30 tablet 1    Sig: TAKE 1 TABLET BY MOUTH EVERYDAY AT BEDTIME      Not Delegated - Analgesics:  Muscle Relaxants Failed - 12/29/2020  8:59 AM      Failed - This refill cannot be delegated      Passed - Valid encounter within last 6 months    Recent Outpatient Visits           1 month ago Low back pain with sciatica, sciatica laterality unspecified, unspecified back pain laterality, unspecified chronicity   [facility] Jerrol Banana., MD   1 month ago Right foot pain   [facility] Jerrol Banana., MD   3 months ago Dysphagia, unspecified type   [facility] Jerrol Banana., MD   4 months ago Chest wall pain   [facility] Jerrol Banana., MD   6 months ago Dysphagia, unspecified type   [facility] Jerrol Banana., MD       Future Appointments             In 5 days Jerrol Banana., MD [facility], [place]   In 1 month Jerrol Banana., MD [facility], [place]

## 2021-01-01 ENCOUNTER — Encounter: Payer: Medicare Other | Admitting: Physical Therapy

## 2021-01-03 ENCOUNTER — Ambulatory Visit: Payer: Self-pay | Admitting: Family Medicine

## 2021-01-03 ENCOUNTER — Encounter: Payer: Medicare Other | Admitting: Physical Therapy

## 2021-01-03 DIAGNOSIS — M5126 Other intervertebral disc displacement, lumbar region: Secondary | ICD-10-CM | POA: Diagnosis not present

## 2021-01-03 DIAGNOSIS — M48061 Spinal stenosis, lumbar region without neurogenic claudication: Secondary | ICD-10-CM | POA: Diagnosis not present

## 2021-01-03 DIAGNOSIS — M5116 Intervertebral disc disorders with radiculopathy, lumbar region: Secondary | ICD-10-CM | POA: Diagnosis not present

## 2021-01-08 ENCOUNTER — Encounter: Payer: Medicare Other | Admitting: Physical Therapy

## 2021-01-10 ENCOUNTER — Encounter: Payer: Medicare Other | Admitting: Physical Therapy

## 2021-01-12 ENCOUNTER — Telehealth: Payer: Self-pay

## 2021-01-12 NOTE — Telephone Encounter (Signed)
Lm on vm for patient to return call 

## 2021-01-12 NOTE — Telephone Encounter (Signed)
-----   Message from Marlon Pel, RN sent at 12/01/2020  3:47 PM EST ----- Needs h pylori stool antigen.  See results 2/11- [place]

## 2021-01-12 NOTE — Telephone Encounter (Signed)
Spoke with patient to remind her that she is due for a repeat stool study at this time. She is aware that no appt is necessary. She can stop by the lab in the basement at her convenience between 7:30 AM and 5 PM, Monday through Friday. Patient denies being on any PPIs and is aware that if she was she will need to wait an additional 2 weeks before completing the stool test. Patient verbalized understanding and had no concerns at the end of the call.

## 2021-01-15 ENCOUNTER — Encounter: Payer: Medicare Other | Admitting: Physical Therapy

## 2021-01-16 ENCOUNTER — Other Ambulatory Visit: Payer: Medicare Other

## 2021-01-16 DIAGNOSIS — A048 Other specified bacterial intestinal infections: Secondary | ICD-10-CM | POA: Diagnosis not present

## 2021-01-17 ENCOUNTER — Encounter: Payer: Medicare Other | Admitting: Physical Therapy

## 2021-01-17 LAB — HELICOBACTER PYLORI  SPECIAL ANTIGEN
MICRO NUMBER:: 11705628
SPECIMEN QUALITY: ADEQUATE

## 2021-01-18 NOTE — Progress Notes (Signed)
Subjective:   VIVICA DOBOSZ is a 75 y.o. female who presents for Medicare Annual (Subsequent) preventive examination.  I connected with Olivette Beckmann today by telephone and verified that I am speaking with the correct person using two identifiers. Location patient: home Location provider: work Persons participating in the virtual visit: patient, provider.   I discussed the limitations, risks, security and privacy concerns of performing an evaluation and management service by telephone and the availability of in person appointments. I also discussed with the patient that there may be a patient responsible charge related to this service. The patient expressed understanding and verbally consented to this telephonic visit.    Interactive audio and video telecommunications were attempted between this provider and patient, however failed, due to patient having technical difficulties OR patient did not have access to video capability.  We continued and completed visit with audio only.   Review of Systems    N/A  Cardiac Risk Factors include: advanced age (>62men, >3 women)     Objective:    Today's Vitals   01/22/21 1045  BP: 110/76  Pulse: 68  Temp: 98.2 F (36.8 C)  TempSrc: Oral  SpO2: 97%   There is no height or weight on file to calculate BMI.  Advanced Directives 01/22/2021 01/17/2020 01/13/2019 08/25/2018 01/09/2018 01/02/2017 08/07/2016  Does Patient Have a Medical Advance Directive? Yes Yes Yes Yes Yes Yes No  Type of Paramedic of [place];Living will [place];Living will [place];Living will [place];Living will [place];Living will Living will;Healthcare Power of Attorney -  Copy of [place] in Chart? Yes - validated most recent copy scanned in chart (See row information) No - copy requested No - copy requested - No - copy requested No - copy requested -     Current Medications (verified) Outpatient Encounter Medications as of 01/22/2021  Medication Sig  . alendronate (FOSAMAX) 70 MG tablet TAKE 1 TABLET EVERY 7 DAYS WITH A FULL GLASS OF WATER ON AN EMPTY STOMACH  . cholecalciferol (VITAMIN D) 1000 UNITS tablet Take 1,000 Units by mouth daily.  . cyclobenzaprine (FLEXERIL) 5 MG tablet TAKE 1 TABLET BY MOUTH EVERYDAY AT BEDTIME  . Docosahexaenoic Acid (DHA OMEGA 3) 100 MG CAPS TAKE 2 EACH BY MOUTH DAILY.  Marland Kitchen estrogen, conjugated,-medroxyprogesterone (PREMPRO) 0.3-1.5 MG tablet Take 1 tablet by mouth daily.  . famotidine (PEPCID) 20 MG tablet Take 1 tablet (20 mg total) by mouth 2 (two) times daily.  Marland Kitchen ibuprofen (ADVIL,MOTRIN) 800 MG tablet Take 1 tablet (800 mg total) by mouth every 8 (eight) hours as needed.  . Multiple Vitamin (MULTIVITAMIN) capsule Take 1 capsule by mouth daily.  . RESTASIS 0.05 % ophthalmic emulsion Place 1 drop into both eyes 2 (two) times daily.  . tazarotene (AVAGE) 0.1 % cream Apply topically at bedtime. For acne  . traMADol (ULTRAM) 50 MG tablet Take 1 tablet (50 mg total) by mouth 2 (two) times daily as needed.  Marland Kitchen omeprazole (PRILOSEC) 40 MG capsule Take 1 capsule (40 mg total) by mouth 2 (two) times daily for 14 days. (Patient not taking: Reported on 01/22/2021)   No facility-administered encounter medications on file as of 01/22/2021.    Allergies (verified) Patient has no known allergies.   History: Past Medical History:  Diagnosis Date  . Arthritis   . Cataract   . Concussion   . GERD (gastroesophageal reflux disease)   . Hormone replacement therapy   .  Hyperlipidemia   . Osteopenia   . Osteoporosis    Past Surgical History:  Procedure Laterality Date  . LUMBAR DISC SURGERY    . TONSILLECTOMY    . TUBAL LIGATION     Family History  Problem Relation Age of Onset  . Heart disease Father   . Alzheimer's disease Mother   . Heart disease Mother   . Heart disease Brother   . Hypertension Brother   .  Breast cancer Cousin   . Healthy Son    Social History   Socioeconomic History  . Marital status: Married    Spouse name: Not on file  . Number of children: 1  . Years of education: Not on file  . Highest education level: Bachelor's degree (e.g., BA, AB, BS)  Occupational History  . 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 Smoking status: Never Smoker  . Smokeless tobacco: Never Used  Vaping Use  . Vaping Use: Never used  Substance and Sexual Activity  . Alcohol use: No  . Drug use: No  . Sexual activity: Yes    Birth control/protection: None, Surgical  Other Topics Concern  . Not on file  Social History Narrative  . Not on file   Social Determinants of Health   Financial Resource Strain: Low Risk   . Difficulty of Paying Living Expenses: Not hard at all  Food Insecurity: No Food Insecurity  . Worried About Charity fundraiser in the Last Year: Never true  . Ran Out of Food in the Last Year: Never true  Transportation Needs: No Transportation Needs  . Lack of Transportation (Medical): No  . Lack of Transportation (Non-Medical): No  Physical Activity: Inactive  . Days of Exercise per Week: 0 days  . Minutes of Exercise per Session: 0 min  Stress: No Stress Concern Present  . Feeling of Stress : Not at all  Social Connections: Socially Integrated  . Frequency of Communication with Friends and Family: More than three times a week  . Frequency of Social Gatherings with Friends and Family: Three times a week  . Attends Religious Services: More than 4 times per year  . Active Member of Clubs or Organizations: Yes  . Attends Archivist Meetings: More than 4 times per year  . Marital Status: Married    Tobacco Counseling Counseling given: Not Answered   Clinical Intake:  Pre-visit preparation completed: Yes  Pain : No/denies pain     Nutritional Risks: None Diabetes: No  How often do you need to have someone help you when  you read instructions, pamphlets, or other written materials from your doctor or pharmacy?: 1 - Never  Diabetic? No  Interpreter Needed?: No  Information entered by :: [facility], LPN   Activities of Daily Living In your present state of health, do you have any difficulty performing the following activities: 01/22/2021  Hearing? N  Vision? N  Difficulty concentrating or making decisions? N  Walking or climbing stairs? N  Dressing or bathing? N  Doing errands, shopping? N  Preparing Food and eating ? N  Using the Toilet? N  In the past six months, have you accidently leaked urine? N  Do you have problems with loss of bowel control? N  Managing your Medications? N  Managing your Finances? N  Housekeeping or managing your Housekeeping? N  Some recent data might be hidden    Patient Care Team: Jerrol Banana., MD  as PCP - General (Family Medicine) Shon Hough, MD as Consulting Physician (Ophthalmology) Rubie Maid, MD as Referring Physician (Obstetrics and Gynecology) Jannifer Franklin, NP as Nurse Practitioner (Neurology) Christene Slates, MD (Dermatology) Newman Pies, MD as Consulting Physician (Neurosurgery)  Indicate any recent Medical Services you may have received from other than [facility] providers in the past year (date may be approximate).     Assessment:   This is a routine wellness examination for Janet.  Hearing/Vision screen No exam data present  Dietary issues and exercise activities discussed: Current Exercise Habits: The patient does not participate in regular exercise at present, Exercise limited by: orthopedic condition(s)  Goals    . DIET - INCREASE WATER INTAKE     Recommend increasing water intake to 6-8 8 oz glasses a day.     . Exercise 3x per week (30 min per time)     Recommend to exercise for 3 days a week for at least 30 minutes at a time.       Depression Screen PHQ 2/9 Scores 01/22/2021 01/17/2020 02/25/2019 01/13/2019 12/01/2018  10/26/2018 01/09/2018  PHQ - 2 Score 0 0 0 0 2 0 0    Fall Risk Fall Risk  01/22/2021 01/17/2020 01/13/2019 12/01/2018 10/26/2018  Falls in the past year? 0 0 1 1 1   Comment - - - - -  Number falls in past yr: 0 0 0 0 0  Comment - - tripped over magazine - -  Injury with Fall? 0 0 1 1 1   Follow up - - Falls prevention discussed - -    FALL RISK PREVENTION PERTAINING TO THE HOME:  Any stairs in or around the home? Yes  If so, are there any without handrails? No  Home free of loose throw rugs in walkways, pet beds, electrical cords, etc? Yes  Adequate lighting in your home to reduce risk of falls? Yes   ASSISTIVE DEVICES UTILIZED TO PREVENT FALLS:  Life alert? No  Use of a cane, walker or w/c? No  Grab bars in the bathroom? Yes  Shower chair or bench in shower? Yes  Elevated toilet seat or a handicapped toilet? Yes    Cognitive Function: Normal cognitive status assessed by observation by this Nurse Health Advisor. No abnormalities found.       6CIT Screen 01/17/2020 01/13/2019 01/02/2017  What Year? 0 points 0 points 0 points  What month? 0 points 0 points 0 points  What time? 0 points 0 points 0 points  Count back from 20 0 points 0 points 0 points  Months in reverse 0 points 0 points 0 points  Repeat phrase 0 points 0 points 0 points  Total Score 0 0 0    Immunizations Immunization History  Administered Date(s) Administered  . Fluad Quad(high Dose 65+) 07/07/2019  . Hepatitis A 04/09/2006  . Influenza, High Dose Seasonal PF 08/19/2015, 07/30/2016, 07/17/2017  . Influenza-Unspecified 08/21/2018  . PFIZER(Purple Top)SARS-COV-2 Vaccination 11/25/2019, 12/21/2019, 09/21/2020  . Pneumococcal Conjugate-13 01/02/2017  . Pneumococcal Polysaccharide-23 08/10/2013  . Td 04/01/2019  . Yellow Fever 04/09/2006    TDAP status: Up to date  Flu Vaccine status: Up to date  Pneumococcal vaccine status: Up to date  Covid-19 vaccine status: Completed vaccines  Qualifies for Shingles  Vaccine? Yes   Zostavax completed No   Shingrix Completed?: Yes  Screening Tests Health Maintenance  Topic Date Due  . DEXA SCAN  03/23/2021  . INFLUENZA VACCINE  05/21/2021  . MAMMOGRAM  03/24/2022  .  COLONOSCOPY (Pts 45-12yrs Insurance coverage will need to be confirmed)  06/11/2023  . TETANUS/TDAP  03/31/2029  . COVID-19 Vaccine  Completed  . Hepatitis C Screening  Completed  . PNA vac Low Risk Adult  Completed  . HPV VACCINES  Aged Out    Health Maintenance  There are no preventive care reminders to display for this patient.  Colorectal cancer screening: Type of screening: Colonoscopy. Completed 06/10/13. Repeat every 10 years  Mammogram status: Completed 03/24/20. Repeat every year  Bone Density status: Completed 03/24/19. Results reflect: Bone density results: OSTEOPOROSIS. Repeat every 2 years.  Lung Cancer Screening: (Low Dose CT Chest recommended if Age 67-80 years, 30 pack-year currently smoking OR have quit w/in 15years.) does not qualify.   Additional Screening:  Hepatitis C Screening: Up to date  Vision Screening: Recommended annual ophthalmology exams for early detection of glaucoma and other disorders of the eye. Is the patient up to date with their annual eye exam?  Yes  Who is the provider or what is the name of the office in which the patient attends annual eye exams? Dr Kathrin Penner If pt is not established with a provider, would they like to be referred to a provider to establish care? No .   Dental Screening: Recommended annual dental exams for proper oral hygiene  Community Resource Referral / Chronic Care Management: CRR required this visit?  No   CCM required this visit?  No      Plan:     I have personally reviewed and noted the following in the patient's chart:   . Medical and social history . Use of alcohol, tobacco or illicit drugs  . Current medications and supplements . Functional ability and status . Nutritional status . Physical  activity . Advanced directives . List of other physicians . Hospitalizations, surgeries, and ER visits in previous 12 months . Vitals . Screenings to include cognitive, depression, and falls . Referrals and appointments  In addition, I have reviewed and discussed with patient certain preventive protocols, quality metrics, and best practice recommendations. A written personalized care plan for preventive services as well as general preventive health recommendations were provided to patient.     Ariadne Rissmiller [place], [place]   06/25/6212   Nurse Notes: None.

## 2021-01-22 ENCOUNTER — Other Ambulatory Visit: Payer: Self-pay

## 2021-01-22 ENCOUNTER — Ambulatory Visit (INDEPENDENT_AMBULATORY_CARE_PROVIDER_SITE_OTHER): Payer: Medicare Other

## 2021-01-22 VITALS — BP 110/76 | HR 68 | Temp 98.2°F

## 2021-01-22 DIAGNOSIS — Z0000 Encounter for general adult medical examination without abnormal findings: Secondary | ICD-10-CM

## 2021-01-22 NOTE — Patient Instructions (Signed)
Alexis Medina , Thank you for taking time to come for your Medicare Wellness Visit. I appreciate your ongoing commitment to your health goals. Please review the following plan we discussed and let me know if I can assist you in the future.   Screening recommendations/referrals: Colonoscopy: Up to date, due 05/2023 Mammogram: Up to date, due 03/2021 Bone Density: Up to date, due 03/2021 Recommended yearly ophthalmology/optometry visit for glaucoma screening and checkup Recommended yearly dental visit for hygiene and checkup  Vaccinations: Influenza vaccine: Up to date, due fall 2022 Pneumococcal vaccine: Completed series Tdap vaccine: Up to date, due 03/2029 Shingles vaccine: Shingrix discussed. Please contact your pharmacy for coverage information.     Advanced directives: Currently on file.  Conditions/risks identified: Recommend to exercise for 3 days a week for at least 30 minutes at a time and increase water intake to 6-8 8 oz glasses a day.  Next appointment: 02/19/21 @ 8:40 AM with Dr Rosanna Randy    Preventive Care 75 Years and Older, Female Preventive care refers to lifestyle choices and visits with your health care provider that can promote health and wellness. What does preventive care include?  A yearly physical exam. This is also called an annual well check.  Dental exams once or twice a year.  Routine eye exams. Ask your health care provider how often you should have your eyes checked.  Personal lifestyle choices, including:  Daily care of your teeth and gums.  Regular physical activity.  Eating a healthy diet.  Avoiding tobacco and drug use.  Limiting alcohol use.  Practicing safe sex.  Taking low-dose aspirin every day.  Taking vitamin and mineral supplements as recommended by your health care provider. What happens during an annual well check? The services and screenings done by your health care provider during your annual well check will depend on your age,  overall health, lifestyle risk factors, and family history of disease. Counseling  Your health care provider may ask you questions about your:  Alcohol use.  Tobacco use.  Drug use.  Emotional well-being.  Home and relationship well-being.  Sexual activity.  Eating habits.  History of falls.  Memory and ability to understand (cognition).  Work and work Statistician.  Reproductive health. Screening  You may have the following tests or measurements:  Height, weight, and BMI.  Blood pressure.  Lipid and cholesterol levels. These may be checked every 5 years, or more frequently if you are over 75 years old.  Skin check.  Lung cancer screening. You may have this screening every year starting at age 75 if you have a 30-pack-year history of smoking and currently smoke or have quit within the past 15 years.  Fecal occult blood test (FOBT) of the stool. You may have this test every year starting at age 75.  Flexible sigmoidoscopy or colonoscopy. You may have a sigmoidoscopy every 5 years or a colonoscopy every 10 years starting at age 75.  Hepatitis C blood test.  Hepatitis B blood test.  Sexually transmitted disease (STD) testing.  Diabetes screening. This is done by checking your blood sugar (glucose) after you have not eaten for a while (fasting). You may have this done every 1-3 years.  Bone density scan. This is done to screen for osteoporosis. You may have this done starting at age 75.  Mammogram. This may be done every 1-2 years. Talk to your health care provider about how often you should have regular mammograms. Talk with your health care provider about your test  results, treatment options, and if necessary, the need for more tests. Vaccines  Your health care provider may recommend certain vaccines, such as:  Influenza vaccine. This is recommended every year.  Tetanus, diphtheria, and acellular pertussis (Tdap, Td) vaccine. You may need a Td booster every 10  years.  Zoster vaccine. You may need this after age 53.  Pneumococcal 13-valent conjugate (PCV13) vaccine. One dose is recommended after age 75.  Pneumococcal polysaccharide (PPSV23) vaccine. One dose is recommended after age 75. Talk to your health care provider about which screenings and vaccines you need and how often you need them. This information is not intended to replace advice given to you by your health care provider. Make sure you discuss any questions you have with your health care provider. Document Released: 11/03/2015 Document Revised: 06/26/2016 Document Reviewed: 08/08/2015 Elsevier Interactive Patient Education  2017 [place] Prevention in the Home Falls can cause injuries. They can happen to people of all ages. There are many things you can do to make your home safe and to help prevent falls. What can I do on the outside of my home?  Regularly fix the edges of walkways and driveways and fix any cracks.  Remove anything that might make you trip as you walk through a door, such as a raised step or threshold.  Trim any bushes or trees on the path to your home.  Use bright outdoor lighting.  Clear any walking paths of anything that might make someone trip, such as rocks or tools.  Regularly check to see if handrails are loose or broken. Make sure that both sides of any steps have handrails.  Any raised decks and porches should have guardrails on the edges.  Have any leaves, snow, or ice cleared regularly.  Use sand or salt on walking paths during winter.  Clean up any spills in your garage right away. This includes oil or grease spills. What can I do in the bathroom?  Use night lights.  Install grab bars by the toilet and in the tub and shower. Do not use towel bars as grab bars.  Use non-skid mats or decals in the tub or shower.  If you need to sit down in the shower, use a plastic, non-slip stool.  Keep the floor dry. Clean up any water that  spills on the floor as soon as it happens.  Remove soap buildup in the tub or shower regularly.  Attach bath mats securely with double-sided non-slip rug tape.  Do not have throw rugs and other things on the floor that can make you trip. What can I do in the bedroom?  Use night lights.  Make sure that you have a light by your bed that is easy to reach.  Do not use any sheets or blankets that are too big for your bed. They should not hang down onto the floor.  Have a firm chair that has side arms. You can use this for support while you get dressed.  Do not have throw rugs and other things on the floor that can make you trip. What can I do in the kitchen?  Clean up any spills right away.  Avoid walking on wet floors.  Keep items that you use a lot in easy-to-reach places.  If you need to reach something above you, use a strong step stool that has a grab bar.  Keep electrical cords out of the way.  Do not use floor polish or wax that makes  floors slippery. If you must use wax, use non-skid floor wax.  Do not have throw rugs and other things on the floor that can make you trip. What can I do with my stairs?  Do not leave any items on the stairs.  Make sure that there are handrails on both sides of the stairs and use them. Fix handrails that are broken or loose. Make sure that handrails are as long as the stairways.  Check any carpeting to make sure that it is firmly attached to the stairs. Fix any carpet that is loose or worn.  Avoid having throw rugs at the top or bottom of the stairs. If you do have throw rugs, attach them to the floor with carpet tape.  Make sure that you have a light switch at the top of the stairs and the bottom of the stairs. If you do not have them, ask someone to add them for you. What else can I do to help prevent falls?  Wear shoes that:  Do not have high heels.  Have rubber bottoms.  Are comfortable and fit you well.  Are closed at the  toe. Do not wear sandals.  If you use a stepladder:  Make sure that it is fully opened. Do not climb a closed stepladder.  Make sure that both sides of the stepladder are locked into place.  Ask someone to hold it for you, if possible.  Clearly mark and make sure that you can see:  Any grab bars or handrails.  First and last steps.  Where the edge of each step is.  Use tools that help you move around (mobility aids) if they are needed. These include:  Canes.  Walkers.  Scooters.  Crutches.  Turn on the lights when you go into a dark area. Replace any light bulbs as soon as they burn out.  Set up your furniture so you have a clear path. Avoid moving your furniture around.  If any of your floors are uneven, fix them.  If there are any pets around you, be aware of where they are.  Review your medicines with your doctor. Some medicines can make you feel dizzy. This can increase your chance of falling. Ask your doctor what other things that you can do to help prevent falls. This information is not intended to replace advice given to you by your health care provider. Make sure you discuss any questions you have with your health care provider. Document Released: 08/03/2009 Document Revised: 03/14/2016 Document Reviewed: 11/11/2014 Elsevier Interactive Patient Education  2017 [employer].

## 2021-01-23 ENCOUNTER — Encounter: Payer: Medicare Other | Admitting: Physical Therapy

## 2021-01-25 ENCOUNTER — Encounter: Payer: Medicare Other | Admitting: Physical Therapy

## 2021-01-29 ENCOUNTER — Encounter: Payer: Medicare Other | Admitting: Physical Therapy

## 2021-01-31 ENCOUNTER — Encounter: Payer: Medicare Other | Admitting: Physical Therapy

## 2021-01-31 DIAGNOSIS — Z961 Presence of intraocular lens: Secondary | ICD-10-CM | POA: Diagnosis not present

## 2021-01-31 DIAGNOSIS — H16103 Unspecified superficial keratitis, bilateral: Secondary | ICD-10-CM | POA: Diagnosis not present

## 2021-01-31 DIAGNOSIS — H35372 Puckering of macula, left eye: Secondary | ICD-10-CM | POA: Diagnosis not present

## 2021-01-31 DIAGNOSIS — H04123 Dry eye syndrome of bilateral lacrimal glands: Secondary | ICD-10-CM | POA: Diagnosis not present

## 2021-02-05 ENCOUNTER — Encounter: Payer: Medicare Other | Admitting: Physical Therapy

## 2021-02-07 ENCOUNTER — Encounter: Payer: Medicare Other | Admitting: Physical Therapy

## 2021-02-12 ENCOUNTER — Encounter: Payer: Medicare Other | Admitting: Physical Therapy

## 2021-02-14 ENCOUNTER — Encounter: Payer: Medicare Other | Admitting: Physical Therapy

## 2021-02-16 NOTE — Progress Notes (Signed)
I,Alexis Medina,acting as a scribe for Alexis Durie, MD.,have documented all relevant documentation on the behalf of Alexis Durie, MD,as directed by  Alexis Durie, MD while in the presence of Alexis Durie, MD.   Established patient visit   Patient: Alexis Medina   DOB: 1946-03-04   75 y.o. Female  MRN: [id number] Visit Date: 02/19/2021  Today's healthcare provider: [name], MD   Chief Complaint  Patient presents with  . Follow-up   Subjective    HPI  Patient had microdiscectomy and had immediate pain relief.  She feels well from this. He does complain of some fatigue and waking up tired.  She has occasional chest pain which is not really exertional.  She's concerns about heart disease. Follow up for back pain  The patient was last seen for this 3 months ago. Changes made at last visit include disk surgery with Dr. Arnoldo Morale.  She reports good compliance with treatment. She feels that condition is Improved. She is not having side effects.  She does have worsening kyphoscoliosis as she is aging.  Neurosurgeon said no treatment needed      Medications: Outpatient Medications Prior to Visit  Medication Sig  . alendronate (FOSAMAX) 70 MG tablet TAKE 1 TABLET EVERY 7 DAYS WITH A FULL GLASS OF WATER ON AN EMPTY STOMACH  . cholecalciferol (VITAMIN D) 1000 UNITS tablet Take 1,000 Units by mouth daily.  . cyclobenzaprine (FLEXERIL) 5 MG tablet TAKE 1 TABLET BY MOUTH EVERYDAY AT BEDTIME  . Docosahexaenoic Acid (DHA OMEGA 3) 100 MG CAPS TAKE 2 EACH BY MOUTH DAILY.  Marland Kitchen estrogen, conjugated,-medroxyprogesterone (PREMPRO) 0.3-1.5 MG tablet Take 1 tablet by mouth daily.  . famotidine (PEPCID) 20 MG tablet Take 1 tablet (20 mg total) by mouth 2 (two) times daily.  Marland Kitchen ibuprofen (ADVIL,MOTRIN) 800 MG tablet Take 1 tablet (800 mg total) by mouth every 8 (eight) hours as needed.  . Multiple Vitamin (MULTIVITAMIN) capsule Take 1 capsule by mouth daily.  Marland Kitchen  omeprazole (PRILOSEC) 40 MG capsule Take 1 capsule (40 mg total) by mouth 2 (two) times daily for 14 days. (Patient not taking: Reported on 01/22/2021)  . RESTASIS 0.05 % ophthalmic emulsion Place 1 drop into both eyes 2 (two) times daily.  . tazarotene (AVAGE) 0.1 % cream Apply topically at bedtime. For acne  . traMADol (ULTRAM) 50 MG tablet Take 1 tablet (50 mg total) by mouth 2 (two) times daily as needed.   No facility-administered medications prior to visit.    Review of Systems  Constitutional: Negative for appetite change, chills, fatigue and fever.  Respiratory: Negative for chest tightness and shortness of breath.   Cardiovascular: Negative for chest pain and palpitations.  Gastrointestinal: Negative for abdominal pain, nausea and vomiting.  Neurological: Negative for dizziness and weakness.        Objective    BP 121/79   Pulse 68   Temp (!) 97.4 F (36.3 C)   Resp 16   Ht 5\' 2"  (1.575 m)   Wt 112 lb (50.8 kg)   BMI 20.49 kg/m  BP Readings from Last 3 Encounters:  02/19/21 121/79  01/22/21 110/76  11/21/20 112/73   Wt Readings from Last 3 Encounters:  02/19/21 112 lb (50.8 kg)  11/21/20 110 lb (49.9 kg)  11/02/20 111 lb 3.2 oz (50.4 kg)       Physical Exam    No results found for any visits on 02/19/21.  Assessment & Plan  1. Other fatigue Nonspecific. - CBC w/Diff/Platelet - TSH - Lipid panel - Comprehensive Metabolic Panel (CMET)  2. ASCVD (arteriosclerotic cardiovascular disease) Found on CT scan of the chest a couple months ago.  Patient is concerned so will refer to cardiology for risk assessment. - CBC w/Diff/Platelet - TSH - Lipid panel - Comprehensive Metabolic Panel (CMET) - Ambulatory referral to Cardiology  3. Chest pain, unspecified type Nonspecific for angina.  Cardiology pending. - CBC w/Diff/Platelet - TSH - Lipid panel - Comprehensive Metabolic Panel (CMET) - Ambulatory referral to Cardiology  4. Hyperlipidemia,  unspecified hyperlipidemia type Check lipids and will treat with statin depending on level.  Last LDL was 103 - CBC w/Diff/Platelet - TSH - Lipid panel - Comprehensive Metabolic Panel (CMET)  5. AK (actinic keratosis) For to Derm. - CBC w/Diff/Platelet - TSH - Lipid panel - Comprehensive Metabolic Panel (CMET)   Return in about 6 months (around 08/22/2021).      I, Alexis Durie, MD, have reviewed all documentation for this visit. The documentation on 02/21/21 for the exam, diagnosis, procedures, and orders are all accurate and complete.    Lumina Gitto Cranford Mon, MD  [facility] [phone] (phone) [phone] (fax)  [place]

## 2021-02-19 ENCOUNTER — Ambulatory Visit (INDEPENDENT_AMBULATORY_CARE_PROVIDER_SITE_OTHER): Payer: Medicare Other | Admitting: Family Medicine

## 2021-02-19 ENCOUNTER — Encounter: Payer: Self-pay | Admitting: Family Medicine

## 2021-02-19 ENCOUNTER — Other Ambulatory Visit: Payer: Self-pay

## 2021-02-19 VITALS — BP 121/79 | HR 68 | Temp 97.4°F | Resp 16 | Ht 62.0 in | Wt 112.0 lb

## 2021-02-19 DIAGNOSIS — E785 Hyperlipidemia, unspecified: Secondary | ICD-10-CM

## 2021-02-19 DIAGNOSIS — R079 Chest pain, unspecified: Secondary | ICD-10-CM

## 2021-02-19 DIAGNOSIS — I2510 Atherosclerotic heart disease of native coronary artery without angina pectoris: Secondary | ICD-10-CM

## 2021-02-19 DIAGNOSIS — R5383 Other fatigue: Secondary | ICD-10-CM

## 2021-02-19 DIAGNOSIS — L570 Actinic keratosis: Secondary | ICD-10-CM | POA: Diagnosis not present

## 2021-02-20 LAB — CBC WITH DIFFERENTIAL/PLATELET
Basophils Absolute: 0 10*3/uL (ref 0.0–0.2)
Basos: 1 %
EOS (ABSOLUTE): 0.1 10*3/uL (ref 0.0–0.4)
Eos: 2 %
Hematocrit: 40.2 % (ref 34.0–46.6)
Hemoglobin: 12.8 g/dL (ref 11.1–15.9)
Immature Grans (Abs): 0 10*3/uL (ref 0.0–0.1)
Immature Granulocytes: 0 %
Lymphocytes Absolute: 2.5 10*3/uL (ref 0.7–3.1)
Lymphs: 40 %
MCH: 29.8 pg (ref 26.6–33.0)
MCHC: 31.8 g/dL (ref 31.5–35.7)
MCV: 94 fL (ref 79–97)
Monocytes Absolute: 0.7 10*3/uL (ref 0.1–0.9)
Monocytes: 11 %
Neutrophils Absolute: 2.8 10*3/uL (ref 1.4–7.0)
Neutrophils: 46 %
Platelets: 258 10*3/uL (ref 150–450)
RBC: 4.3 x10E6/uL (ref 3.77–5.28)
RDW: 12.6 % (ref 11.7–15.4)
WBC: 6.1 10*3/uL (ref 3.4–10.8)

## 2021-02-20 LAB — COMPREHENSIVE METABOLIC PANEL
ALT: 12 IU/L (ref 0–32)
AST: 22 IU/L (ref 0–40)
Albumin/Globulin Ratio: 1.4 (ref 1.2–2.2)
Albumin: 4.4 g/dL (ref 3.7–4.7)
Alkaline Phosphatase: 51 IU/L (ref 44–121)
BUN/Creatinine Ratio: 15 (ref 12–28)
BUN: 12 mg/dL (ref 8–27)
Bilirubin Total: 0.6 mg/dL (ref 0.0–1.2)
CO2: 25 mmol/L (ref 20–29)
Calcium: 10.2 mg/dL (ref 8.7–10.3)
Chloride: 100 mmol/L (ref 96–106)
Creatinine, Ser: 0.79 mg/dL (ref 0.57–1.00)
Globulin, Total: 3.1 g/dL (ref 1.5–4.5)
Glucose: 77 mg/dL (ref 65–99)
Potassium: 4.6 mmol/L (ref 3.5–5.2)
Sodium: 138 mmol/L (ref 134–144)
Total Protein: 7.5 g/dL (ref 6.0–8.5)
eGFR: 78 mL/min/{1.73_m2} (ref >59)

## 2021-02-20 LAB — LIPID PANEL
Chol/HDL Ratio: 2.4 ratio (ref 0.0–4.4)
Cholesterol, Total: 212 mg/dL — ABNORMAL HIGH (ref 100–199)
HDL: 88 mg/dL (ref >39)
LDL Chol Calc (NIH): 112 mg/dL — ABNORMAL HIGH (ref 0–99)
Triglycerides: 66 mg/dL (ref 0–149)
VLDL Cholesterol Cal: 12 mg/dL (ref 5–40)

## 2021-02-20 LAB — TSH: TSH: 1.26 u[IU]/mL (ref 0.450–4.500)

## 2021-02-26 DIAGNOSIS — L814 Other melanin hyperpigmentation: Secondary | ICD-10-CM | POA: Diagnosis not present

## 2021-02-26 DIAGNOSIS — L570 Actinic keratosis: Secondary | ICD-10-CM | POA: Diagnosis not present

## 2021-02-28 DIAGNOSIS — M21621 Bunionette of right foot: Secondary | ICD-10-CM | POA: Diagnosis not present

## 2021-03-01 ENCOUNTER — Ambulatory Visit: Payer: Medicare Other | Admitting: Gastroenterology

## 2021-03-14 ENCOUNTER — Encounter: Payer: Self-pay | Admitting: Internal Medicine

## 2021-03-14 ENCOUNTER — Other Ambulatory Visit: Payer: Self-pay

## 2021-03-14 ENCOUNTER — Ambulatory Visit (INDEPENDENT_AMBULATORY_CARE_PROVIDER_SITE_OTHER): Payer: Medicare Other | Admitting: Internal Medicine

## 2021-03-14 VITALS — BP 140/90 | HR 68 | Ht 63.0 in | Wt 113.0 lb

## 2021-03-14 DIAGNOSIS — E7800 Pure hypercholesterolemia, unspecified: Secondary | ICD-10-CM

## 2021-03-14 DIAGNOSIS — I700 Atherosclerosis of aorta: Secondary | ICD-10-CM

## 2021-03-14 DIAGNOSIS — R072 Precordial pain: Secondary | ICD-10-CM

## 2021-03-14 DIAGNOSIS — I2510 Atherosclerotic heart disease of native coronary artery without angina pectoris: Secondary | ICD-10-CM | POA: Diagnosis not present

## 2021-03-14 DIAGNOSIS — I2584 Coronary atherosclerosis due to calcified coronary lesion: Secondary | ICD-10-CM | POA: Diagnosis not present

## 2021-03-14 MED ORDER — ASPIRIN EC 81 MG PO TBEC: DELAYED_RELEASE_TABLET | ORAL | Status: DC

## 2021-03-14 NOTE — Progress Notes (Signed)
New Outpatient Visit Date: 03/14/2021  Referring Provider: Jerrol Banana., MD [id number] [place],  [place] [address]  Chief Complaint: Chest pain  HPI:  Alexis Medina is a 75 y.o. female who is being seen today for the evaluation of chest pain and coronary artery calcification at the request of Dr. Rosanna Randy. She has a history of hyperlipidemia, GERD, and arthritis.  She underwent CT chest in February for evaluation of soft tissue density overlying the sternum as well as chest pain.  The study was notable for aortic atherosclerosis and coronary artery calcification.  No other significant abnormality was observed.  Today, Alexis Medina reports that she has been having heartburn for "a while" with pain in her upper abdomen.  At times, it would radiate to her upper chest.  It is relatively infrequent and does not seem to be related to eating or exertion.  She had an episode a few weeks ago that lasted about 10 minutes and was so severe that she almost called 911.  At this time, she is having episodes about once a week.  The pain typically only lasts a few seconds and feels like squeezing with light pressure.  It is 5-[birth year]/2009 in intensity.  There are no associated symptoms such as shortness of breath, nausea, or diaphoresis.  Alexis Medina denies palpitations, edema, and claudication.  She has not undergone heart testing in the past.  --------------------------------------------------------------------------------------------------  Cardiovascular History & Procedures: Cardiovascular Problems:  Coronary artery calcification  Chest pain  Risk Factors:  Coronary artery calcification/aortic atherosclerosis, hyperlipidemia, family history, and age greater than 27  Cath/PCI:  None  CV Surgery:  None  EP Procedures and Devices:  None  Non-Invasive Evaluation(s):  None  Recent CV Pertinent Labs: Lab Results  Component Value Date   CHOL 212 (H) 02/19/2021   HDL 88  02/19/2021   LDLCALC 112 (H) 02/19/2021   LDLCALC 121 (H) 07/07/2017   TRIG 66 02/19/2021   CHOLHDL 2.4 02/19/2021   CHOLHDL 2.4 07/07/2017   K 4.6 02/19/2021   BUN 12 02/19/2021   CREATININE 0.79 02/19/2021   CREATININE 0.75 07/07/2017    --------------------------------------------------------------------------------------------------  Past Medical History:  Diagnosis Date  . Arthritis   . Cataract   . Concussion   . GERD (gastroesophageal reflux disease)   . Hormone replacement therapy   . Hyperlipidemia   . Osteopenia   . Osteoporosis     Past Surgical History:  Procedure Laterality Date  . LUMBAR DISC SURGERY    . TONSILLECTOMY    . TUBAL LIGATION      Current Meds  Medication Sig  . alendronate (FOSAMAX) 70 MG tablet TAKE 1 TABLET EVERY 7 DAYS WITH A FULL GLASS OF WATER ON AN EMPTY STOMACH  . cholecalciferol (VITAMIN D) 1000 UNITS tablet Take 1,000 Units by mouth daily.  . cyclobenzaprine (FLEXERIL) 5 MG tablet TAKE 1 TABLET BY MOUTH EVERYDAY AT BEDTIME  . Docosahexaenoic Acid (DHA OMEGA 3) 100 MG CAPS TAKE 2 EACH BY MOUTH DAILY.  Marland Kitchen estrogen, conjugated,-medroxyprogesterone (PREMPRO) 0.3-1.5 MG tablet Take 1 tablet by mouth daily.  . famotidine (PEPCID) 20 MG tablet Take 1 tablet (20 mg total) by mouth 2 (two) times daily.  Marland Kitchen ibuprofen (ADVIL,MOTRIN) 800 MG tablet Take 1 tablet (800 mg total) by mouth every 8 (eight) hours as needed.  . meloxicam (MOBIC) 7.5 MG tablet Take 7.5 mg by mouth in the morning and at bedtime.  . Multiple Vitamin (MULTIVITAMIN) capsule Take 1 capsule by  mouth daily.  . RESTASIS 0.05 % ophthalmic emulsion Place 1 drop into both eyes 2 (two) times daily.  . tazarotene (AVAGE) 0.1 % cream Apply topically at bedtime. For acne  . traMADol (ULTRAM) 50 MG tablet Take 1 tablet (50 mg total) by mouth 2 (two) times daily as needed.    Allergies: Patient has no known allergies.  Social History   Tobacco Use  . Smoking status: Never Smoker  .  Smokeless tobacco: Never Used  Vaping Use  . Vaping Use: Never used  Substance Use Topics  . Alcohol use: Not Currently    Comment: glass of wine once a year  . Drug use: No    Family History  Problem Relation Age of Onset  . Heart disease Father   . Alzheimer's disease Mother   . Heart disease Mother   . Heart disease Brother   . Hypertension Brother   . Breast cancer Cousin   . Healthy Son     Review of Systems: A 12-system review of systems was performed and was negative except as noted in the HPI.  --------------------------------------------------------------------------------------------------  Physical Exam: BP 140/90 (BP Location: Right Arm, Patient Position: Sitting, Cuff Size: Normal)   Pulse 68   Ht 5\' 3"  (1.6 m)   Wt 113 lb (51.3 kg)   SpO2 97%   BMI 20.02 kg/m   General: NAD. HEENT: No conjunctival pallor or scleral icterus. Facemask in place. Neck: Supple without lymphadenopathy, thyromegaly, JVD, or HJR. No carotid bruit. Lungs: Normal work of breathing. Clear to auscultation bilaterally without wheezes or crackles. Heart: Regular rate and rhythm without murmurs, rubs, or gallops. Non-displaced PMI. Abd: Bowel sounds present. Soft, NT/ND without hepatosplenomegaly Ext: No lower extremity edema. Radial, PT, and DP pulses are 2+ bilaterally Skin: Warm and dry without rash. Neuro: CNIII-XII intact. Strength and fine-touch sensation intact in upper and lower extremities bilaterally. Psych: Normal mood and affect.  EKG:  Normal sinus rhythm without abnormality.  Lab Results  Component Value Date   WBC 6.1 02/19/2021   HGB 12.8 02/19/2021   HCT 40.2 02/19/2021   MCV 94 02/19/2021   PLT 258 02/19/2021    Lab Results  Component Value Date   NA 138 02/19/2021   K 4.6 02/19/2021   CL 100 02/19/2021   CO2 25 02/19/2021   BUN 12 02/19/2021   CREATININE 0.79 02/19/2021   GLUCOSE 77 02/19/2021   ALT 12 02/19/2021    Lab Results  Component Value  Date   CHOL 212 (H) 02/19/2021   HDL 88 02/19/2021   LDLCALC 112 (H) 02/19/2021   TRIG 66 02/19/2021   CHOLHDL 2.4 02/19/2021     --------------------------------------------------------------------------------------------------  ASSESSMENT AND PLAN: Chest pain, coronary artery calcification, and aortic atherosclerosis: Alexis Medina reports episodic epigastric and chest discomfort that has both typical and atypical features.  It is squeezing and tight in quality but is not exertional and usually only lasts a few seconds (she reports 1 episode lasting up to 10 minutes).  Her physical exam and EKG today are normal.  Cardiac risk factors include coronary artery calcification, aortic atherosclerosis, hyperlipidemia, family history, and age greater than 32.  We have discussed further evaluation options and have agreed to perform an exercise pharmacologic myocardial perfusion stress test, particularly in light of coronary artery calcification and aortic atherosclerosis noted on recent chest CT.  In the meantime, I have encouraged Alexis Medina to begin taking aspirin 81 mg daily.  Hyperlipidemia: Recent lipid panel is notable for  LDL of 112.  In the setting of coronary artery calcification and aortic atherosclerosis, lipid reduction is indicated.  We will defer adding a statin pending results of ischemia testing.  Shared Decision Making/Informed Consent The risks [chest pain, shortness of breath, cardiac arrhythmias, dizziness, blood pressure fluctuations, myocardial infarction, stroke/transient ischemic attack, nausea, vomiting, allergic reaction, radiation exposure, metallic taste sensation and life-threatening complications (estimated to be 1 in 10,000)], benefits (risk stratification, diagnosing coronary artery disease, treatment guidance) and alternatives of a nuclear stress test were discussed in detail with Alexis Medina and she agrees to proceed.  Follow-up: Return to clinic in 1 month.  Nelva Bush, MD 03/14/2021 8:53 AM

## 2021-03-14 NOTE — Patient Instructions (Addendum)
Medication Instructions:  - Your physician has recommended you make the following change in your medication:   1) START aspirin 81 mg- take 1 tablet by mouth once daily   *If you need a refill on your cardiac medications before your next appointment, please call your pharmacy*   Lab Work: - none ordered  If you have labs (blood work) drawn today and your tests are completely normal, you will receive your results only by: Marland Kitchen MyChart Message (if you have MyChart) OR . A paper copy in the mail If you have any lab test that is abnormal or we need to change your treatment, we will call you to review the results.   Testing/Procedures:  1) Exercise Myoview (Cardiac Nuclear): - Your physician has requested that you have an exercise stress myoview.   [place]  Your caregiver has ordered a Stress Test with nuclear imaging. The purpose of this test is to evaluate the blood supply to your heart muscle. This procedure is referred to as a "Non-Invasive Stress Test." This is because other than having an IV started in your vein, nothing is inserted or "invades" your body. Cardiac stress tests are done to find areas of poor blood flow to the heart by determining the extent of coronary artery disease (CAD). Some patients exercise on a treadmill, which naturally increases the blood flow to your heart, while others who are  unable to walk on a treadmill due to physical limitations have a pharmacologic/chemical stress agent called Lexiscan . This medicine will mimic walking on a treadmill by temporarily increasing your coronary blood flow.   Please note: these test may take anywhere between 2-4 hours to complete  PLEASE REPORT TO [place] AT THE FIRST DESK WILL DIRECT YOU WHERE TO GO  Date of Procedure:_____________________________________  Arrival Time for Procedure:______________________________  Instructions regarding medication:   __x__ : You may take all of  your regular medications the morning of your test with enough water to get them down safely   PLEASE NOTIFY THE OFFICE AT LEAST 24 HOURS IN ADVANCE IF YOU ARE UNABLE TO [place].  [phone] AND  PLEASE NOTIFY NUCLEAR MEDICINE AT [facility] AT LEAST 24 HOURS IN ADVANCE IF YOU ARE UNABLE TO KEEP YOUR APPOINTMENT. [phone]  How to prepare for your Myoview test:  1. Do not eat or drink after midnight 2. No caffeine for 24 hours prior to test 3. No smoking 24 hours prior to test. 4. Your medication may be taken with water.  If your doctor stopped a medication because of this test, do not take that medication. 5. Ladies, please do not wear dresses.  Skirts or pants are appropriate. Please wear a short sleeve shirt. 6. No perfume, cologne or lotion. 7. Wear comfortable walking shoes. No heels!   Follow-Up: At [facility], you and your health needs are our priority.  As part of our continuing mission to provide you with exceptional heart care, we have created designated Provider Care Teams.  These Care Teams include your primary Cardiologist (physician) and Advanced Practice Providers (APPs -  Physician Assistants and Nurse Practitioners) who all work together to provide you with the care you need, when you need it.  We recommend signing up for the patient portal called "MyChart".  Sign up information is provided on this After Visit Summary.  MyChart is used to connect with patients for Virtual Visits (Telemedicine).  Patients are able to view lab/test results, encounter notes, upcoming  appointments, etc.  Non-urgent messages can be sent to your provider as well.   To learn more about what you can do with MyChart, go to [email or web address].    Your next appointment:   1 month(s)  The format for your next appointment:   In Person  Provider:   You may see Nelva Bush, MD or one of the following Advanced Practice Providers on your designated Care Team:    Murray Hodgkins, NP  Christell Faith, PA-C  Marrianne Mood, PA-C  Cadence Kathlen Mody, [place]  Laurann Montana, NP    Other Instructions   Cardiac Nuclear Scan A cardiac nuclear scan is a test that is done to check the flow of blood to your heart. It is done when you are resting and when you are exercising. The test looks for problems such as:  Not enough blood reaching a portion of the heart.  The heart muscle not working as it should. You may need this test if:  You have heart disease.  You have had lab results that are not normal.  You have had heart surgery or a balloon procedure to open up blocked arteries (angioplasty).  You have chest pain.  You have shortness of breath. In this test, a special dye (tracer) is put into your bloodstream. The tracer will travel to your heart. A camera will then take pictures of your heart to see how the tracer moves through your heart. This test is usually done at a hospital and takes 2-4 hours. Tell a doctor about:  Any allergies you have.  All medicines you are taking, including vitamins, herbs, eye drops, creams, and over-the-counter medicines.  Any problems you or family members have had with anesthetic medicines.  Any blood disorders you have.  Any surgeries you have had.  Any medical conditions you have.  Whether you are pregnant or may be pregnant. What are the risks? Generally, this is a safe test. However, problems may occur, such as:  Serious chest pain and heart attack. This is only a risk if the stress portion of the test is done.  Rapid heartbeat.  A feeling of warmth in your chest. This feeling usually does not last long.  Allergic reaction to the tracer. What happens before the test?  Ask your doctor about changing or stopping your normal medicines. This is important.  Follow instructions from your doctor about what you cannot eat or drink.  Remove your jewelry on the day of the test. What happens during the test?  An  IV tube will be inserted into one of your veins.  Your doctor will give you a small amount of tracer through the IV tube.  You will wait for 20-40 minutes while the tracer moves through your bloodstream.  Your heart will be monitored with an electrocardiogram (ECG).  You will lie down on an exam table.  Pictures of your heart will be taken for about 15-20 minutes.  You may also have a stress test. For this test, one of these things may be done: ? You will be asked to exercise on a treadmill or a stationary bike. ? You will be given medicines that will make your heart work harder. This is done if you are unable to exercise.  When blood flow to your heart has peaked, a tracer will again be given through the IV tube.  After 20-40 minutes, you will get back on the exam table. More pictures will be taken of your heart.  Depending on the  tracer that is used, more pictures may need to be taken 3-4 hours later.  Your IV tube will be removed when the test is over. The test may vary among doctors and hospitals. What happens after the test?  Ask your doctor: ? Whether you can return to your normal schedule, including diet, activities, and medicines. ? Whether you should drink more fluids. This will help to remove the tracer from your body. Drink enough fluid to keep your pee (urine) pale yellow.  Ask your doctor, or the department that is doing the test: ? When will my results be ready? ? How will I get my results? Summary  A cardiac nuclear scan is a test that is done to check the flow of blood to your heart.  Tell your doctor whether you are pregnant or may be pregnant.  Before the test, ask your doctor about changing or stopping your normal medicines. This is important.  Ask your doctor whether you can return to your normal activities. You may be asked to drink more fluids. This information is not intended to replace advice given to you by your health care provider. Make sure you  discuss any questions you have with your health care provider. Document Revised: 01/27/2019 Document Reviewed: 03/23/2018 Elsevier Patient Education  [place].

## 2021-03-15 ENCOUNTER — Encounter: Payer: Self-pay | Admitting: Gastroenterology

## 2021-03-15 ENCOUNTER — Ambulatory Visit (INDEPENDENT_AMBULATORY_CARE_PROVIDER_SITE_OTHER): Payer: Medicare Other | Admitting: Gastroenterology

## 2021-03-15 VITALS — BP 130/70 | HR 73 | Ht 63.0 in | Wt 114.0 lb

## 2021-03-15 DIAGNOSIS — Z8619 Personal history of other infectious and parasitic diseases: Secondary | ICD-10-CM | POA: Diagnosis not present

## 2021-03-15 DIAGNOSIS — R9389 Abnormal findings on diagnostic imaging of other specified body structures: Secondary | ICD-10-CM | POA: Diagnosis not present

## 2021-03-15 DIAGNOSIS — I2510 Atherosclerotic heart disease of native coronary artery without angina pectoris: Secondary | ICD-10-CM | POA: Diagnosis not present

## 2021-03-15 DIAGNOSIS — R1310 Dysphagia, unspecified: Secondary | ICD-10-CM

## 2021-03-15 DIAGNOSIS — K219 Gastro-esophageal reflux disease without esophagitis: Secondary | ICD-10-CM

## 2021-03-15 NOTE — Patient Instructions (Signed)
If you are age 75 or older, your body mass index should be between 23-30. Your Body mass index is 20.19 kg/m. If this is out of the aforementioned range listed, please consider follow up with your Primary Care Provider.  If you are age 60 or younger, your body mass index should be between 19-25. Your Body mass index is 20.19 kg/m. If this is out of the aformentioned range listed, please consider follow up with your Primary Care Provider.   __________________________________________________________  The [facility] providers would like to encourage you to use [facility] to communicate with providers for non-urgent requests or questions.  Due to long hold times on the telephone, sending your provider a message by [facility] may be a faster and more efficient way to get a response.  Please allow 48 business hours for a response.  Please remember that this is for non-urgent requests.   Follow up as needed.  Thank you for entrusting me with your care and for choosing [facility], Dr. Durango Cellar

## 2021-03-15 NOTE — Progress Notes (Signed)
HPI :  75 year old female here for a follow-up for GERD, H. pylori infection, chest wall nodule.  Recall that I saw her in January when she complained of reflux symptoms, early satiety, chest wall nodule.  She was also having periodic dysphagia.  EGD was performed in January.  Her esophagus looked normal but empiric dilation was performed.  She states the dilation resolved her dysphagia and has not recurred, she is been doing much better in that regard.  I took biopsies of her stomach in light of her early satiety and she was found to have indeterminate H. pylori testing.  Stool antigen test was sent and was positive for H. pylori.  In February she was treated with combination of PPI, clarithromycin, amoxicillin, Flagyl.  She had a follow-up stool test in March after she completed therapy and the stool test was negative for H. pylori.  She states since therapy was performed her symptoms have resolved and she is eating well she denies any early satiety.  No nausea or vomiting.  She is not really having much of any heartburn right now that is bothering her.  She is getting by with using Pepcid once to twice daily and that controls her symptoms pretty well.  She had a suspected lipoma on her chest wall that was tender to palpation that persisted.  She underwent a CT scan of the chest for this in February which did not show any concerning abnormality to cause this, however she did have three-vessel coronary artery disease on imaging.  She was referred to cardiologist and has a nuclear stress test pending to be done in a few weeks.  Prior work-up: EGD 11/01/20 -  - The exam of the esophagus was otherwise normal. No obvious stenosis / stricture appreciated. No inflammatory changes. - A guidewire was placed and the scope was withdrawn. Empiric dilation was performed in the entire esophagus with a Savary dilator with mild resistance at 17 mm. Relook endoscopy showed no mucosal wrents. - The entire examined  stomach was normal. Biopsies were taken with a cold forceps for Helicobacter pylori testing. - The duodenal bulb and second portion of the duodenum were normal.  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 By immunohistochemistry, the lymphoplasmacytic infiltrate is composed of an admixture of B and T cells (CD20, CD3) without aberrant expression of CD10, CD5 or cyclin-D1. The plasma cell population is polytypic by kappa and lambda in situ hybridization. H. pylori immunohistochemistry is inconclusive; there is dot-like positivity which is more commonly seen and treated infection. Clinical correlation is recommended.  CT chest 11/24/20 -  IMPRESSION: 1. No mass or other abnormality of the sternum or overlying anterior soft tissues. 2. Coronary artery disease. Aortic Atherosclerosis (ICD10-I70.0).   Last colonoscopy 06/10/2013 - Dr. Allen Norris - 65mm hyperplastic polyp, no adenomas     Past Medical History:  Diagnosis Date  . Arthritis   . Cataract   . Concussion   . GERD (gastroesophageal reflux disease)   . History of Helicobacter pylori infection   . Hormone replacement therapy   . Hyperlipidemia   . Osteopenia   . Osteoporosis      Past Surgical History:  Procedure Laterality Date  . LUMBAR DISC SURGERY    . TONSILLECTOMY    . TUBAL LIGATION    . UPPER GI ENDOSCOPY     Family  History  Problem Relation Age of Onset  . Heart failure Father   . Alzheimer's disease Mother   . Heart failure Mother   . Heart disease Brother   . Hypertension Brother   . Stroke Brother   . Atrial fibrillation Brother   . Breast cancer Cousin   . Healthy Son    Social History   Tobacco Use  . Smoking status: Never Smoker  . Smokeless tobacco: Never Used  Vaping Use  . Vaping Use: Never used  Substance Use  Topics  . Alcohol use: Not Currently    Comment: glass of wine once a year  . Drug use: No   Current Outpatient Medications  Medication Sig Dispense Refill  . alendronate (FOSAMAX) 70 MG tablet TAKE 1 TABLET EVERY 7 DAYS WITH A FULL GLASS OF WATER ON AN EMPTY STOMACH 12 tablet 3  . aspirin EC 81 MG tablet Take 1 tablet (81 mg) by mouth once daily. Swallow whole.    . cholecalciferol (VITAMIN D) 1000 UNITS tablet Take 1,000 Units by mouth daily.    . cyclobenzaprine (FLEXERIL) 5 MG tablet TAKE 1 TABLET BY MOUTH EVERYDAY AT BEDTIME 30 tablet 1  . Docosahexaenoic Acid (DHA OMEGA 3) 100 MG CAPS TAKE 2 EACH BY MOUTH DAILY. 60 capsule 12  . estrogen, conjugated,-medroxyprogesterone (PREMPRO) 0.3-1.5 MG tablet Take 1 tablet by mouth daily. 90 tablet 3  . famotidine (PEPCID) 20 MG tablet Take 1 tablet (20 mg total) by mouth 2 (two) times daily. 180 tablet 1  . ibuprofen (ADVIL,MOTRIN) 800 MG tablet Take 1 tablet (800 mg total) by mouth every 8 (eight) hours as needed. 90 tablet 3  . meloxicam (MOBIC) 7.5 MG tablet Take 7.5 mg by mouth in the morning and at bedtime.    . Multiple Vitamin (MULTIVITAMIN) capsule Take 1 capsule by mouth daily.    . RESTASIS 0.05 % ophthalmic emulsion Place 1 drop into both eyes 2 (two) times daily.    . tazarotene (AVAGE) 0.1 % cream Apply topically at bedtime. For acne    . traMADol (ULTRAM) 50 MG tablet Take 1 tablet (50 mg total) by mouth 2 (two) times daily as needed. 135 tablet 5   No current facility-administered medications for this visit.   No Known Allergies   Review of Systems: All systems reviewed and negative except where noted in HPI.   Lab Results  Component Value Date   WBC 6.1 02/19/2021   HGB 12.8 02/19/2021   HCT 40.2 02/19/2021   MCV 94 02/19/2021   PLT 258 02/19/2021    Lab Results  Component Value Date   CREATININE 0.79 02/19/2021   BUN 12 02/19/2021   NA 138 02/19/2021   K 4.6 02/19/2021   CL 100 02/19/2021   CO2 25 02/19/2021     Lab Results  Component Value Date   ALT 12 02/19/2021   AST 22 02/19/2021   ALKPHOS 51 02/19/2021   BILITOT 0.6 02/19/2021     Physical Exam: BP 130/70   Pulse 73   Ht 5\' 3"  (1.6 m)   Wt 114 lb (51.7 kg)   BMI 20.19 kg/m  Constitutional: Pleasant,well-developed, female in no acute distress. Neurological: Alert and oriented to person place and time. Psychiatric: Normal mood and affect. Behavior is normal.   ASSESSMENT AND PLAN: 75 year old female here for reassessment of the following:  History of H. Pylori Dysphagia GERD Coronary artery disease  EGD as above, empiric dilation has resolved her dysphagia doing much better  in this regard.  Her reflux symptoms are controlled with Pepcid.  She ultimately tested positive for H. pylori and was treated for that which resolves her early satiety and her stomach has been feeling better as well.  Overall she is pleased with how she is feeling over the past few months.  Incidentally noted on CT scan of the chest looking at this nodule on the chest wall, she was noted to have coronary artery disease.  Has seen cardiology and now has nuclear stress pending for that.  We discussed her course to date, I would continue Pepcid as needed as it is working well for her, fortunately she does not require PPI at this time.  She had no Barrett's esophagus.  Hopefully dysphagia does not recur.  We discussed that given she had no adenomas in 2014, she would not be due for screening for another 2 years at which point time she will be close to 75 years old.  In this light I do not feel strongly that she needs another colonoscopy for screening purposes.  She was in agreement.  She can follow-up with me as needed.  All questions answered  Dudley Cellar, MD [facility]

## 2021-03-16 ENCOUNTER — Encounter: Payer: Self-pay | Admitting: Internal Medicine

## 2021-03-16 DIAGNOSIS — R072 Precordial pain: Secondary | ICD-10-CM | POA: Insufficient documentation

## 2021-03-16 DIAGNOSIS — E7800 Pure hypercholesterolemia, unspecified: Secondary | ICD-10-CM | POA: Insufficient documentation

## 2021-03-16 DIAGNOSIS — I2510 Atherosclerotic heart disease of native coronary artery without angina pectoris: Secondary | ICD-10-CM | POA: Insufficient documentation

## 2021-03-16 DIAGNOSIS — I700 Atherosclerosis of aorta: Secondary | ICD-10-CM | POA: Insufficient documentation

## 2021-03-20 ENCOUNTER — Other Ambulatory Visit: Payer: Self-pay | Admitting: Family Medicine

## 2021-03-20 DIAGNOSIS — M5137 Other intervertebral disc degeneration, lumbosacral region: Secondary | ICD-10-CM

## 2021-03-21 ENCOUNTER — Other Ambulatory Visit: Payer: Self-pay | Admitting: Family Medicine

## 2021-03-21 DIAGNOSIS — M5440 Lumbago with sciatica, unspecified side: Secondary | ICD-10-CM

## 2021-03-21 DIAGNOSIS — M25559 Pain in unspecified hip: Secondary | ICD-10-CM

## 2021-03-21 NOTE — Telephone Encounter (Signed)
Requested medication (s) are due for refill today: yes  Requested medication (s) are on the active medication list: yes  Last refill: 12/29/20  #30  1 refill  Future visit scheduled yes 08/23/21  Notes to clinic: not delegated  Requested Prescriptions  Pending Prescriptions Disp Refills   cyclobenzaprine (FLEXERIL) 5 MG tablet [Pharmacy Med Name: CYCLOBENZAPRINE 5 MG TABLET] 30 tablet 1    Sig: TAKE 1 TABLET BY MOUTH EVERYDAY AT BEDTIME      Not Delegated - Analgesics:  Muscle Relaxants Failed - 03/21/2021  5:11 PM      Failed - This refill cannot be delegated      Passed - Valid encounter within last 6 months    Recent Outpatient Visits           1 month ago Other fatigue   [facility] Jerrol Banana., MD   4 months ago Low back pain with sciatica, sciatica laterality unspecified, unspecified back pain laterality, unspecified chronicity   [facility] Jerrol Banana., MD   4 months ago Right foot pain   [facility] Jerrol Banana., MD   6 months ago Dysphagia, unspecified type   [facility] Jerrol Banana., MD   7 months ago Chest wall pain   [facility] Jerrol Banana., MD       Future Appointments             In 4 weeks End, Harrell Gave, MD [facility], [place]

## 2021-03-22 ENCOUNTER — Other Ambulatory Visit: Payer: Self-pay | Admitting: Family Medicine

## 2021-03-22 NOTE — Telephone Encounter (Signed)
Requested Prescriptions  Pending Prescriptions Disp Refills  . alendronate (FOSAMAX) 70 MG tablet [Pharmacy Med Name: ALENDRONATE SODIUM TABS 4'S 70MG ] 12 tablet 0    Sig: TAKE 1 TABLET EVERY 7 DAYS WITH A FULL GLASS OF WATER ON AN EMPTY STOMACH     Endocrinology:  Bisphosphonates Failed - 03/22/2021  3:13 AM      Failed - Vitamin D in normal range and within 360 days    No results found for: [id number], [id number], [id number], 25OHVITD3, 25OHVITD2, 25OHVITD3, 25OHVITD2, 25OHVITD1, 25OHVITD2, 25OHVITD3, VD25OH       Passed - Ca in normal range and within 360 days    Calcium  Date Value Ref Range Status  02/19/2021 10.2 8.7 - 10.3 mg/dL Final         Passed - Valid encounter within last 12 months    Recent Outpatient Visits          1 month ago Other fatigue   [facility] Jerrol Banana., MD   4 months ago Low back pain with sciatica, sciatica laterality unspecified, unspecified back pain laterality, unspecified chronicity   [facility] Jerrol Banana., MD   4 months ago Right foot pain   [facility] Jerrol Banana., MD   6 months ago Dysphagia, unspecified type   [facility] Jerrol Banana., MD   7 months ago Chest wall pain   [facility] Jerrol Banana., MD      Future Appointments            In 4 weeks End, Harrell Gave, MD [facility], [place]

## 2021-03-29 ENCOUNTER — Other Ambulatory Visit: Payer: Self-pay

## 2021-03-29 ENCOUNTER — Ambulatory Visit
Admission: RE | Admit: 2021-03-29 | Discharge: 2021-03-29 | Disposition: A | Discharge status: Home / Self Care | Payer: Medicare Other | Source: Ambulatory Visit | Attending: Internal Medicine | Admitting: Internal Medicine

## 2021-03-29 DIAGNOSIS — R072 Precordial pain: Secondary | ICD-10-CM | POA: Diagnosis not present

## 2021-03-29 LAB — NM MYOCAR MULTI W/SPECT W/WALL MOTION / EF
Estimated workload: 8.5 METS
Exercise duration (min): 6 min
Exercise duration (sec): 59 s
LV dias vol: 31 mL (ref 46–106)
LV sys vol: 7 mL
MPHR: 146 {beats}/min
Peak HR: 157 {beats}/min
Percent HR: 107 %
Rest HR: 75 {beats}/min
SDS: 0
SRS: 0
SSS: 0
TID: 0.83

## 2021-03-29 MED ORDER — TECHNETIUM TC 99M TETROFOSMIN IV KIT
10.3800 | PACK | Freq: Once | INTRAVENOUS | Status: AC | PRN
  Administered 2021-03-29: 10.38 via INTRAVENOUS

## 2021-03-29 MED ORDER — TECHNETIUM TC 99M TETROFOSMIN IV KIT
30.0000 | PACK | Freq: Once | INTRAVENOUS | Status: AC | PRN
  Administered 2021-03-29: 32.6 via INTRAVENOUS

## 2021-04-09 ENCOUNTER — Other Ambulatory Visit: Payer: Self-pay | Admitting: Gastroenterology

## 2021-04-19 ENCOUNTER — Encounter: Payer: Self-pay | Admitting: Internal Medicine

## 2021-04-19 ENCOUNTER — Other Ambulatory Visit: Payer: Self-pay

## 2021-04-19 ENCOUNTER — Ambulatory Visit (INDEPENDENT_AMBULATORY_CARE_PROVIDER_SITE_OTHER): Payer: Medicare Other | Admitting: Internal Medicine

## 2021-04-19 VITALS — BP 104/80 | HR 85 | Ht 63.0 in | Wt 112.0 lb

## 2021-04-19 DIAGNOSIS — I2510 Atherosclerotic heart disease of native coronary artery without angina pectoris: Secondary | ICD-10-CM | POA: Diagnosis not present

## 2021-04-19 DIAGNOSIS — I700 Atherosclerosis of aorta: Secondary | ICD-10-CM

## 2021-04-19 DIAGNOSIS — R072 Precordial pain: Secondary | ICD-10-CM | POA: Diagnosis not present

## 2021-04-19 DIAGNOSIS — E7800 Pure hypercholesterolemia, unspecified: Secondary | ICD-10-CM

## 2021-04-19 DIAGNOSIS — I2584 Coronary atherosclerosis due to calcified coronary lesion: Secondary | ICD-10-CM | POA: Diagnosis not present

## 2021-04-19 NOTE — Patient Instructions (Signed)
Medication Instructions:   Your physician recommends that you continue on your current medications as directed. Please refer to the Current Medication list given to you today.  *If you need a refill on your cardiac medications before your next appointment, please call your pharmacy*   Lab Work:  None ordered  Testing/Procedures:  None ordered   Follow-Up: At [facility], you and your health needs are our priority.  As part of our continuing mission to provide you with exceptional heart care, we have created designated Provider Care Teams.  These Care Teams include your primary Cardiologist (physician) and Advanced Practice Providers (APPs -  Physician Assistants and Nurse Practitioners) who all work together to provide you with the care you need, when you need it.  We recommend signing up for the patient portal called "MyChart".  Sign up information is provided on this After Visit Summary.  MyChart is used to connect with patients for Virtual Visits (Telemedicine).  Patients are able to view lab/test results, encounter notes, upcoming appointments, etc.  Non-urgent messages can be sent to your provider as well.   To learn more about what you can do with MyChart, go to [email or web address].    Your next appointment:    Follow up early December 2022  The format for your next appointment:   In Person  Provider:   You may see Dr. Harrell Gave End or one of the following Advanced Practice Providers on your designated Care Team:   Murray Hodgkins, NP Christell Faith, PA-C Marrianne Mood, PA-C Cadence Homewood, [place] Laurann [place], NP

## 2021-04-19 NOTE — Progress Notes (Signed)
Follow-up Outpatient Visit Date: 04/19/2021  Primary Care Provider: Jerrol Banana., MD [address] [place] [address]  Chief Complaint: Follow-up chest pain  HPI:  Alexis Medina is a 75 y.o. female with history of hyperlipidemia, GERD, and arthritis, who presents for follow-up of chest pain.  I met her last month at which time she described intermittent epigastric pain radiating to her chest.  Subsequent exercise myocardial perfusion stress test was low risk without ischemia or scar on perfusion images though inferolateral ST depression was noted during stress.  Coronary artery calcification and aortic atherosclerosis were noted.  Today, Alexis Medina reports that her chest pain has been less frequent and is typically brief and self-limited.  She notes 1 episode that occurred while in church that lasted 1 to 2 minutes.  Occasionally, pain is lasted up to 5 minutes.  It is not exertional and is described as pressure and tightness in the center of her chest.  Sometimes, she notes associated nausea.  She has not had any shortness of breath, palpitations, lightheadedness, or edema.  --------------------------------------------------------------------------------------------------  Cardiovascular History & Procedures: Cardiovascular Problems: Coronary artery calcification Chest pain   Risk Factors: Coronary artery calcification/aortic atherosclerosis, hyperlipidemia, family history, and age greater than 50   Cath/PCI: None   CV Surgery: None   EP Procedures and Devices: None   Non-Invasive Evaluation(s): Exercise MPI (03/29/2021): Low risk study without ischemia or scar and perfusion imaging.  1 mm ST depression noted in inferolateral leads during stress.  LVEF 55-65%.  Coronary artery calcification and aortic atherosclerosis noted.  Recent CV Pertinent Labs: Lab Results  Component Value Date   CHOL 212 (H) 02/19/2021   HDL 88 02/19/2021   LDLCALC 112 (H)  02/19/2021   LDLCALC 121 (H) 07/07/2017   TRIG 66 02/19/2021   CHOLHDL 2.4 02/19/2021   CHOLHDL 2.4 07/07/2017   K 4.6 02/19/2021   BUN 12 02/19/2021   CREATININE 0.79 02/19/2021   CREATININE 0.75 07/07/2017    Past medical and surgical history were reviewed and updated in EPIC.  Current Meds  Medication Sig   alendronate (FOSAMAX) 70 MG tablet TAKE 1 TABLET EVERY 7 DAYS WITH A FULL GLASS OF WATER ON AN EMPTY STOMACH   aspirin EC 81 MG tablet Take 1 tablet (81 mg) by mouth once daily. Swallow whole.   cholecalciferol (VITAMIN D) 1000 UNITS tablet Take 1,000 Units by mouth daily.   cyclobenzaprine (FLEXERIL) 5 MG tablet TAKE 1 TABLET BY MOUTH EVERYDAY AT BEDTIME   Docosahexaenoic Acid (DHA OMEGA 3) 100 MG CAPS TAKE 2 EACH BY MOUTH DAILY.   estrogen, conjugated,-medroxyprogesterone (PREMPRO) 0.3-1.5 MG tablet Take 1 tablet by mouth daily.   famotidine (PEPCID) 20 MG tablet TAKE 1 TABLET TWICE A DAY   ibuprofen (ADVIL,MOTRIN) 800 MG tablet Take 1 tablet (800 mg total) by mouth every 8 (eight) hours as needed.   Multiple Vitamin (MULTIVITAMIN) capsule Take 1 capsule by mouth daily.   RESTASIS 0.05 % ophthalmic emulsion Place 1 drop into both eyes 2 (two) times daily.   tazarotene (AVAGE) 0.1 % cream Apply topically at bedtime. For acne   traMADol (ULTRAM) 50 MG tablet TAKE 1 TABLET TWICE A DAY AS NEEDED    Allergies: Patient has no known allergies.  Social History   Tobacco Use   Smoking status: Never   Smokeless tobacco: Never  Vaping Use   Vaping Use: Never used  Substance Use Topics   Alcohol use: Not Currently  Comment: glass of wine once a year   Drug use: No    Family History  Problem Relation Age of Onset   Heart failure Father    Alzheimer's disease Mother    Heart failure Mother    Heart disease Brother    Hypertension Brother    Stroke Brother    Atrial fibrillation Brother    Breast cancer Cousin    Healthy Son     Review of Systems: A 12-system  review of systems was performed and was negative except as noted in the HPI.  --------------------------------------------------------------------------------------------------  Physical Exam: BP 104/80 (BP Location: Left Arm, Patient Position: Sitting, Cuff Size: Normal)   Pulse 85   Ht _0  (1.6 m)   Wt 112 lb (50.8 kg)   SpO2 97%   BMI 19.84 kg/m   General:  NAD. Neck: No JVD or HJR. Lungs: Clear to auscultation bilaterally without wheezes or crackles. Heart: Regular rate and rhythm without murmurs, rubs, or gallops. Abdomen: Soft, nontender, nondistended. Extremities: No lower extremity edema.   Lab Results  Component Value Date   WBC 6.1 02/19/2021   HGB 12.8 02/19/2021   HCT 40.2 02/19/2021   MCV 94 02/19/2021   PLT 258 02/19/2021    Lab Results  Component Value Date   NA 138 02/19/2021   K 4.6 02/19/2021   CL 100 02/19/2021   CO2 25 02/19/2021   BUN 12 02/19/2021   CREATININE 0.79 02/19/2021   GLUCOSE 77 02/19/2021   ALT 12 02/19/2021    Lab Results  Component Value Date   CHOL 212 (H) 02/19/2021   HDL 88 02/19/2021   LDLCALC 112 (H) 02/19/2021   TRIG 66 02/19/2021   CHOLHDL 2.4 02/19/2021    --------------------------------------------------------------------------------------------------  ASSESSMENT AND PLAN: Chest pain: Symptoms remain atypical.  Recent myocardial perfusion stress test was low risk without evidence of ischemia or scar.  Coronary artery calcifications were noted.  We have discussed the potential for noncardiac etiologies, particularly GERD, contributing to her symptoms.  I have suggested that she try an OTC PPI in place of famotidine if she continues to have chest pain.  Should she continue to have symptoms in spite of this, especially if they become more typical in nature, we will need to consider further cardiac testing.  Coronary artery calcification, aortic atherosclerosis, and hyperlipidemia: These findings again noted on  recent myocardial perfusion stress test.  Continue low-dose aspirin.  We discussed addition of statin therapy to target an LDL less than 100, ideally below 70.  Ms. Durango wishes to defer this in favor of further lifestyle modifications.  She will follow-up with Dr. Rosanna Randy in November, at which time a lipid panel should be repeated to assess her response.  If LDL remains suboptimal, she would be willing to try statin at that time.  Follow-up: Return to clinic in 09/2021.  Nelva Bush, MD 04/19/2021 9:43 AM

## 2021-04-20 ENCOUNTER — Encounter: Payer: Self-pay | Admitting: Internal Medicine

## 2021-05-01 DIAGNOSIS — M5116 Intervertebral disc disorders with radiculopathy, lumbar region: Secondary | ICD-10-CM | POA: Diagnosis not present

## 2021-05-01 DIAGNOSIS — Z9889 Other specified postprocedural states: Secondary | ICD-10-CM | POA: Diagnosis not present

## 2021-05-09 DIAGNOSIS — Q828 Other specified congenital malformations of skin: Secondary | ICD-10-CM | POA: Insufficient documentation

## 2021-05-09 DIAGNOSIS — M21621 Bunionette of right foot: Secondary | ICD-10-CM | POA: Diagnosis not present

## 2021-05-09 DIAGNOSIS — L851 Acquired keratosis [keratoderma] palmaris et plantaris: Secondary | ICD-10-CM | POA: Diagnosis not present

## 2021-05-28 ENCOUNTER — Other Ambulatory Visit: Payer: Self-pay

## 2021-05-28 DIAGNOSIS — Z79890 Hormone replacement therapy (postmenopausal): Secondary | ICD-10-CM

## 2021-05-28 MED ORDER — PREMPRO 0.3-1.5 MG PO TABS: 1.0000 | ORAL_TABLET | Freq: Every day | ORAL | 3 refills | Status: DC

## 2021-06-01 ENCOUNTER — Other Ambulatory Visit: Payer: Self-pay | Admitting: Family Medicine

## 2021-06-01 DIAGNOSIS — M5440 Lumbago with sciatica, unspecified side: Secondary | ICD-10-CM

## 2021-06-01 DIAGNOSIS — M25559 Pain in unspecified hip: Secondary | ICD-10-CM

## 2021-06-01 NOTE — Telephone Encounter (Signed)
Requested medication (s) are due for refill today: yes   Requested medication (s) are on the active medication list: yes   Last refill:  04/25/2021  Future visit scheduled: no  Notes to clinic:  this refill cannot be delegated    Requested Prescriptions  Pending Prescriptions Disp Refills   cyclobenzaprine (FLEXERIL) 5 MG tablet [Pharmacy Med Name: CYCLOBENZAPRINE 5 MG TABLET] 30 tablet 1    Sig: TAKE 1 TABLET BY MOUTH EVERYDAY AT BEDTIME     Not Delegated - Analgesics:  Muscle Relaxants Failed - 06/01/2021  9:37 AM      Failed - This refill cannot be delegated      Passed - Valid encounter within last 6 months    Recent Outpatient Visits           3 months ago Other fatigue   [facility] Jerrol Banana., MD   6 months ago Low back pain with sciatica, sciatica laterality unspecified, unspecified back pain laterality, unspecified chronicity   [facility] Jerrol Banana., MD   7 months ago Right foot pain   [facility] Jerrol Banana., MD   8 months ago Dysphagia, unspecified type   [facility] Jerrol Banana., MD   9 months ago Chest wall pain   [facility] Jerrol Banana., MD

## 2021-06-01 NOTE — Telephone Encounter (Signed)
Please review. Ok to refill?  

## 2021-06-14 ENCOUNTER — Other Ambulatory Visit: Payer: Self-pay | Admitting: Family Medicine

## 2021-06-14 NOTE — Telephone Encounter (Signed)
Requested medications are due for refill today.  yes  Requested medications are on the active medications list.  yes  Last refill. 03/25/2021  Future visit scheduled.   yes  Notes to clinic.  Pt is overdue for Lab work - Vitamin D

## 2021-06-26 ENCOUNTER — Other Ambulatory Visit: Payer: Self-pay | Admitting: Family Medicine

## 2021-06-26 DIAGNOSIS — Z1231 Encounter for screening mammogram for malignant neoplasm of breast: Secondary | ICD-10-CM

## 2021-07-12 ENCOUNTER — Ambulatory Visit
Admission: RE | Admit: 2021-07-12 | Discharge: 2021-07-12 | Disposition: A | Discharge status: Home / Self Care | Payer: Medicare Other | Source: Ambulatory Visit | Attending: Family Medicine | Admitting: Family Medicine

## 2021-07-12 ENCOUNTER — Other Ambulatory Visit: Payer: Self-pay

## 2021-07-12 DIAGNOSIS — Z1231 Encounter for screening mammogram for malignant neoplasm of breast: Secondary | ICD-10-CM | POA: Insufficient documentation

## 2021-08-21 ENCOUNTER — Other Ambulatory Visit: Payer: Self-pay | Admitting: Family Medicine

## 2021-08-21 DIAGNOSIS — M5137 Other intervertebral disc degeneration, lumbosacral region: Secondary | ICD-10-CM

## 2021-08-21 NOTE — Telephone Encounter (Signed)
Requested medication (s) are due for refill today:   Provider to review  Requested medication (s) are on the active medication list:   Yes  Future visit scheduled:   No   Last ordered: 03/20/2021 #135, 5 refills  Non delegated refill   Requested Prescriptions  Pending Prescriptions Disp Refills   traMADol (ULTRAM) 50 MG tablet [Pharmacy Med Name: TRAMADOL HCL TABS 50MG ] 135 tablet 5    Sig: TAKE 1 TABLET TWICE A DAY AS NEEDED     Not Delegated - Analgesics:  Opioid Agonists Failed - 08/21/2021  6:59 AM      Failed - This refill cannot be delegated      Failed - Urine Drug Screen completed in last 360 days      Failed - Valid encounter within last 6 months    Recent Outpatient Visits           6 months ago Other fatigue   [facility] Jerrol Banana., MD   9 months ago Low back pain with sciatica, sciatica laterality unspecified, unspecified back pain laterality, unspecified chronicity   [facility] Jerrol Banana., MD   9 months ago Right foot pain   [facility] Jerrol Banana., MD   11 months ago Dysphagia, unspecified type   [facility] Jerrol Banana., MD   1 year ago Chest wall pain   [facility] Jerrol Banana., MD

## 2021-08-23 ENCOUNTER — Other Ambulatory Visit: Payer: Self-pay

## 2021-08-23 ENCOUNTER — Ambulatory Visit (INDEPENDENT_AMBULATORY_CARE_PROVIDER_SITE_OTHER): Payer: Medicare Other | Admitting: Family Medicine

## 2021-08-23 VITALS — BP 111/85 | HR 78 | Temp 97.5°F | Ht 63.0 in | Wt 113.0 lb

## 2021-08-23 DIAGNOSIS — M5137 Other intervertebral disc degeneration, lumbosacral region: Secondary | ICD-10-CM

## 2021-08-23 DIAGNOSIS — E785 Hyperlipidemia, unspecified: Secondary | ICD-10-CM | POA: Diagnosis not present

## 2021-08-23 DIAGNOSIS — M810 Age-related osteoporosis without current pathological fracture: Secondary | ICD-10-CM | POA: Diagnosis not present

## 2021-08-23 DIAGNOSIS — I2510 Atherosclerotic heart disease of native coronary artery without angina pectoris: Secondary | ICD-10-CM | POA: Diagnosis not present

## 2021-08-23 DIAGNOSIS — I2584 Coronary atherosclerosis due to calcified coronary lesion: Secondary | ICD-10-CM | POA: Diagnosis not present

## 2021-08-23 DIAGNOSIS — Z23 Encounter for immunization: Secondary | ICD-10-CM

## 2021-08-23 DIAGNOSIS — Z780 Asymptomatic menopausal state: Secondary | ICD-10-CM

## 2021-08-23 DIAGNOSIS — K219 Gastro-esophageal reflux disease without esophagitis: Secondary | ICD-10-CM | POA: Diagnosis not present

## 2021-08-23 DIAGNOSIS — R5383 Other fatigue: Secondary | ICD-10-CM | POA: Diagnosis not present

## 2021-08-23 DIAGNOSIS — M720 Palmar fascial fibromatosis [Dupuytren]: Secondary | ICD-10-CM | POA: Diagnosis not present

## 2021-08-23 DIAGNOSIS — Z0000 Encounter for general adult medical examination without abnormal findings: Secondary | ICD-10-CM | POA: Diagnosis not present

## 2021-08-23 DIAGNOSIS — E559 Vitamin D deficiency, unspecified: Secondary | ICD-10-CM

## 2021-08-23 NOTE — Progress Notes (Signed)
Annual Wellness Visit     Patient: Alexis Medina, Female    DOB: 02-13-1946, 75 y.o.   MRN: [id number] Visit Date: 08/23/2021  Today's Provider: [name], MD   No chief complaint on file.  Subjective    Alexis Medina is a 75 y.o. female who presents today for her Annual Wellness Visit. She reports consuming a general diet.  She generally feels well. She reports sleeping well. She does not have additional problems to discuss today.   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 Has worsening reflux recently. He is now on alendronate for osteoporosis which was started in 2019. Is doing well with her hormone therapy and her back pain has been stable. She is enjoying life with her husband.   Medications: Outpatient Medications Prior to Visit  Medication Sig   alendronate (FOSAMAX) 70 MG tablet TAKE 1 TABLET EVERY 7 DAYS WITH A FULL GLASS OF WATER ON AN EMPTY STOMACH   aspirin EC 81 MG tablet Take 1 tablet (81 mg) by mouth once daily. Swallow whole.   cholecalciferol (VITAMIN D) 1000 UNITS tablet Take 1,000 Units by mouth daily.   cyclobenzaprine (FLEXERIL) 5 MG tablet TAKE 1 TABLET BY MOUTH EVERYDAY AT BEDTIME   Docosahexaenoic Acid (DHA OMEGA 3) 100 MG CAPS TAKE 2 EACH BY MOUTH DAILY.   estrogen, conjugated,-medroxyprogesterone (PREMPRO) 0.3-1.5 MG tablet Take 1 tablet by mouth daily.   famotidine (PEPCID) 20 MG tablet TAKE 1 TABLET TWICE A DAY   ibuprofen (ADVIL,MOTRIN) 800 MG tablet Take 1 tablet (800 mg total) by mouth every 8 (eight) hours as needed.   Multiple Vitamin (MULTIVITAMIN) capsule Take 1 capsule by mouth daily.   RESTASIS 0.05 % ophthalmic emulsion Place 1 drop into both eyes 2 (two) times daily.   tazarotene (AVAGE) 0.1 % cream Apply topically at bedtime. For acne   traMADol (ULTRAM) 50 MG tablet TAKE 1 TABLET TWICE A DAY AS NEEDED   No facility-administered medications prior to visit.    No Known Allergies  Patient Care  Team: Jerrol Banana., MD as PCP - General (Family Medicine) End, Harrell Gave, MD as PCP - Cardiology (Cardiology) Shon Hough, MD as Consulting Physician (Ophthalmology) Rubie Maid, MD as Referring Physician (Obstetrics and Gynecology) Jannifer Franklin, NP as Nurse Practitioner (Neurology) Christene Slates, MD (Dermatology) Newman Pies, MD as Consulting Physician (Neurosurgery)  Review of Systems  Constitutional: Negative.   HENT: Negative.    Eyes: Negative.   Respiratory: Negative.    Cardiovascular: Negative.   Gastrointestinal: Negative.   Endocrine: Negative.   Genitourinary: Negative.   Musculoskeletal:  Positive for back pain.  Skin: Negative.   Allergic/Immunologic: Negative.   Neurological: Negative.   Hematological: Negative.   Psychiatric/Behavioral: Negative.          Objective    Vitals: BP 111/85 (BP Location: Right Arm, Patient Position: Sitting, Cuff Size: Normal)   Pulse 78   Temp (!) 97.5 F (36.4 C) (Oral)   Ht 5\' 3"  (1.6 m)   Wt 113 lb (51.3 kg)   SpO2 99%   BMI 20.02 kg/m     Physical Exam Constitutional:      Appearance: Normal appearance. She is normal weight.  HENT:     Head: Normocephalic and atraumatic.     Right Ear: Tympanic membrane, ear canal and external ear normal.     Left Ear: Tympanic membrane, ear canal and external ear normal.  Nose: Nose normal.     Mouth/Throat:     Mouth: Mucous membranes are moist.     Pharynx: Oropharynx is clear.  Eyes:     Extraocular Movements: Extraocular movements intact.     Conjunctiva/sclera: Conjunctivae normal.     Pupils: Pupils are equal, round, and reactive to light.  Cardiovascular:     Rate and Rhythm: Normal rate and regular rhythm.     Pulses: Normal pulses.     Heart sounds: Normal heart sounds.  Pulmonary:     Effort: Pulmonary effort is normal.     Breath sounds: Normal breath sounds.  Abdominal:     General: Abdomen is flat. Bowel sounds are normal.      Palpations: Abdomen is soft.  Musculoskeletal:        General: Normal range of motion.     Cervical back: Normal range of motion and neck supple.  Skin:    General: Skin is warm and dry.  Neurological:     General: No focal deficit present.     Mental Status: She is alert and oriented to person, place, and time. Mental status is at baseline.  Psychiatric:        Mood and Affect: Mood normal.        Behavior: Behavior normal.        Thought Content: Thought content normal.        Judgment: Judgment normal.    Most recent functional status assessment: In your present state of health, do you have any difficulty performing the following activities: 08/23/2021  Hearing? N  Vision? N  Difficulty concentrating or making decisions? N  Walking or climbing stairs? N  Dressing or bathing? N  Doing errands, shopping? N  Preparing Food and eating ? -  Using the Toilet? -  In the past six months, have you accidently leaked urine? -  Do you have problems with loss of bowel control? -  Managing your Medications? -  Managing your Finances? -  Housekeeping or managing your Housekeeping? -  Some recent data might be hidden   Most recent fall risk assessment: Fall Risk  08/23/2021  Falls in the past year? 0  Comment -  Number falls in past yr: 0  Comment -  Injury with Fall? 0  Follow up -    Most recent depression screenings: PHQ 2/9 Scores 08/23/2021 02/19/2021  PHQ - 2 Score 0 0  PHQ- 9 Score 2 1   Most recent cognitive screening: 6CIT Screen 01/17/2020  What Year? 0 points  What month? 0 points  What time? 0 points  Count back from 20 0 points  Months in reverse 0 points  Repeat phrase 0 points  Total Score 0   Most recent Audit-C alcohol use screening Alcohol Use Disorder Test (AUDIT) 08/23/2021  1. How often do you have a drink containing alcohol? 1  2. How many drinks containing alcohol do you have on a typical day when you are drinking? 0  3. How often do you have six or  more drinks on one occasion? 0  AUDIT-C Score 1  Alcohol Brief Interventions/Follow-up -   A score of 3 or more in women, and 4 or more in men indicates increased risk for alcohol abuse, EXCEPT if all of the points are from question 1   No results found for any visits on 08/23/21.  Assessment & Plan     Annual wellness visit done today including the all of the following: Reviewed  patient's Family Medical History Reviewed and updated list of patient's medical providers Assessment of cognitive impairment was done Assessed patient's functional ability Established a written schedule for health screening [place] Completed and Reviewed  Exercise Activities and Dietary recommendations  Goals      DIET - INCREASE WATER INTAKE     Recommend increasing water intake to 6-8 8 oz glasses a day.      Exercise 3x per week (30 min per time)     Recommend to exercise for 3 days a week for at least 30 minutes at a time.         Immunization History  Administered Date(s) Administered   Fluad Quad(high Dose 65+) 07/07/2019   Hepatitis A 04/09/2006   Influenza, High Dose Seasonal PF 08/19/2015, 07/30/2016, 07/17/2017   Influenza-Unspecified 08/21/2018   PFIZER(Purple Top)SARS-COV-2 Vaccination 11/25/2019, 12/21/2019, 09/21/2020   Pneumococcal Conjugate-13 01/02/2017   Pneumococcal Polysaccharide-23 08/10/2013   Td 04/01/2019   Yellow Fever 04/09/2006    Health Maintenance  Topic Date Due   Zoster Vaccines- Shingrix (1 of 2) Never done   COVID-19 Vaccine (4 - Booster for Pfizer series) 11/16/2020   DEXA SCAN  03/23/2021   INFLUENZA VACCINE  05/21/2021   COLONOSCOPY (Pts 45-25yrs Insurance coverage will need to be confirmed)  06/11/2023   TETANUS/TDAP  03/31/2029   Pneumonia Vaccine 63+ Years old  Completed   Hepatitis C Screening  Completed   HPV VACCINES  Aged Out     Discussed health benefits of physical activity, and encouraged her to engage in regular  exercise appropriate for her age and condition.    1. Encounter for annual wellness exam in Medicare patient Patient up-to-date.   2. Post-menopausal  - DG Bone Density; Future  3. Need for influenza vaccination   4. Osteoporosis without current pathological fracture, unspecified osteoporosis type On alendronate through 2024 - DG Bone Density; Future  5. ASCVD (arteriosclerotic cardiovascular disease) ASCVD and coronary arteries on CT scan earlier this year.  We will probably need to treat with low-dose Crestor with an LDL of 112. - Comprehensive metabolic panel - TSH  6. Vitamin D deficiency  - VITAMIN D 25 Hydroxy (Vit-D Deficiency, Fractures)  7. Gastroesophageal reflux disease without esophagitis  - Comprehensive metabolic panel - TSH  8. Dupuytren contracture Refer to Dr. Amedeo Plenty - Ambulatory referral to Hand Surgery  9. Hyperlipidemia, unspecified hyperlipidemia type That her low-dose Crestor - Lipid Panel With LDL/HDL Ratio  10. Other fatigue  - TSH   No follow-ups on file.     I, [name], MD, have reviewed all documentation for this visit. The documentation on 08/31/21 for the exam, diagnosis, procedures, and orders are all accurate and complete.    Brooklyne Radke Cranford Mon, MD  [facility] [phone] (phone) [phone] (fax)  [employer]

## 2021-08-24 LAB — VITAMIN D 25 HYDROXY (VIT D DEFICIENCY, FRACTURES): Vit D, 25-Hydroxy: 87.5 ng/mL (ref 30.0–100.0)

## 2021-08-24 LAB — COMPREHENSIVE METABOLIC PANEL
ALT: 12 IU/L (ref 0–32)
AST: 21 IU/L (ref 0–40)
Albumin/Globulin Ratio: 1.5 (ref 1.2–2.2)
Albumin: 4.5 g/dL (ref 3.7–4.7)
Alkaline Phosphatase: 51 IU/L (ref 44–121)
BUN/Creatinine Ratio: 14 (ref 12–28)
BUN: 11 mg/dL (ref 8–27)
Bilirubin Total: 0.5 mg/dL (ref 0.0–1.2)
CO2: 25 mmol/L (ref 20–29)
Calcium: 10.3 mg/dL (ref 8.7–10.3)
Chloride: 101 mmol/L (ref 96–106)
Creatinine, Ser: 0.76 mg/dL (ref 0.57–1.00)
Globulin, Total: 3.1 g/dL (ref 1.5–4.5)
Glucose: 79 mg/dL (ref 70–99)
Potassium: 4.9 mmol/L (ref 3.5–5.2)
Sodium: 140 mmol/L (ref 134–144)
Total Protein: 7.6 g/dL (ref 6.0–8.5)
eGFR: 82 mL/min/{1.73_m2} (ref >59)

## 2021-08-24 LAB — LIPID PANEL WITH LDL/HDL RATIO
Cholesterol, Total: 206 mg/dL — ABNORMAL HIGH (ref 100–199)
HDL: 88 mg/dL (ref >39)
LDL Chol Calc (NIH): 108 mg/dL — ABNORMAL HIGH (ref 0–99)
LDL/HDL Ratio: 1.2 ratio (ref 0.0–3.2)
Triglycerides: 53 mg/dL (ref 0–149)
VLDL Cholesterol Cal: 10 mg/dL (ref 5–40)

## 2021-08-24 LAB — TSH: TSH: 1.19 u[IU]/mL (ref 0.450–4.500)

## 2021-08-29 ENCOUNTER — Ambulatory Visit: Payer: Medicare Other

## 2021-08-30 DIAGNOSIS — L84 Corns and callosities: Secondary | ICD-10-CM | POA: Diagnosis not present

## 2021-08-30 DIAGNOSIS — L821 Other seborrheic keratosis: Secondary | ICD-10-CM | POA: Diagnosis not present

## 2021-08-30 DIAGNOSIS — L814 Other melanin hyperpigmentation: Secondary | ICD-10-CM | POA: Diagnosis not present

## 2021-09-05 ENCOUNTER — Other Ambulatory Visit: Payer: Self-pay

## 2021-09-05 ENCOUNTER — Ambulatory Visit (INDEPENDENT_AMBULATORY_CARE_PROVIDER_SITE_OTHER): Payer: Medicare Other

## 2021-09-05 DIAGNOSIS — Z23 Encounter for immunization: Secondary | ICD-10-CM

## 2021-09-21 NOTE — Telephone Encounter (Signed)
Encounter opened in error

## 2021-09-25 ENCOUNTER — Other Ambulatory Visit: Payer: Medicare Other

## 2021-10-03 ENCOUNTER — Other Ambulatory Visit: Payer: Self-pay

## 2021-10-03 ENCOUNTER — Ambulatory Visit
Admission: RE | Admit: 2021-10-03 | Discharge: 2021-10-03 | Disposition: A | Discharge status: Home / Self Care | Payer: Medicare Other | Source: Ambulatory Visit | Attending: Family Medicine | Admitting: Family Medicine

## 2021-10-03 DIAGNOSIS — M810 Age-related osteoporosis without current pathological fracture: Secondary | ICD-10-CM | POA: Diagnosis not present

## 2021-10-03 DIAGNOSIS — Z780 Asymptomatic menopausal state: Secondary | ICD-10-CM | POA: Insufficient documentation

## 2021-10-03 DIAGNOSIS — M85851 Other specified disorders of bone density and structure, right thigh: Secondary | ICD-10-CM | POA: Diagnosis not present

## 2021-10-18 DIAGNOSIS — M79641 Pain in right hand: Secondary | ICD-10-CM | POA: Diagnosis not present

## 2021-10-18 DIAGNOSIS — M720 Palmar fascial fibromatosis [Dupuytren]: Secondary | ICD-10-CM | POA: Diagnosis not present

## 2021-10-20 DIAGNOSIS — M720 Palmar fascial fibromatosis [Dupuytren]: Secondary | ICD-10-CM | POA: Insufficient documentation

## 2021-10-30 DIAGNOSIS — Z9889 Other specified postprocedural states: Secondary | ICD-10-CM | POA: Diagnosis not present

## 2021-10-30 DIAGNOSIS — I10 Essential (primary) hypertension: Secondary | ICD-10-CM | POA: Diagnosis not present

## 2021-11-13 DIAGNOSIS — Z961 Presence of intraocular lens: Secondary | ICD-10-CM | POA: Diagnosis not present

## 2021-11-13 DIAGNOSIS — H524 Presbyopia: Secondary | ICD-10-CM | POA: Diagnosis not present

## 2021-11-13 DIAGNOSIS — H35372 Puckering of macula, left eye: Secondary | ICD-10-CM | POA: Diagnosis not present

## 2021-11-13 DIAGNOSIS — H04123 Dry eye syndrome of bilateral lacrimal glands: Secondary | ICD-10-CM | POA: Diagnosis not present

## 2021-11-30 ENCOUNTER — Other Ambulatory Visit: Payer: Self-pay | Admitting: Family Medicine

## 2021-11-30 DIAGNOSIS — M5440 Lumbago with sciatica, unspecified side: Secondary | ICD-10-CM

## 2021-11-30 DIAGNOSIS — M25559 Pain in unspecified hip: Secondary | ICD-10-CM

## 2021-12-25 ENCOUNTER — Other Ambulatory Visit: Payer: Self-pay | Admitting: Orthopedic Surgery

## 2021-12-25 DIAGNOSIS — M720 Palmar fascial fibromatosis [Dupuytren]: Secondary | ICD-10-CM | POA: Diagnosis not present

## 2022-01-02 DIAGNOSIS — M79641 Pain in right hand: Secondary | ICD-10-CM | POA: Insufficient documentation

## 2022-01-07 DIAGNOSIS — M79641 Pain in right hand: Secondary | ICD-10-CM | POA: Diagnosis not present

## 2022-01-17 DIAGNOSIS — M79641 Pain in right hand: Secondary | ICD-10-CM | POA: Diagnosis not present

## 2022-01-23 DIAGNOSIS — M79641 Pain in right hand: Secondary | ICD-10-CM | POA: Diagnosis not present

## 2022-01-30 DIAGNOSIS — M79641 Pain in right hand: Secondary | ICD-10-CM | POA: Diagnosis not present

## 2022-02-08 DIAGNOSIS — M79641 Pain in right hand: Secondary | ICD-10-CM | POA: Diagnosis not present

## 2022-02-13 DIAGNOSIS — M79641 Pain in right hand: Secondary | ICD-10-CM | POA: Diagnosis not present

## 2022-02-20 ENCOUNTER — Ambulatory Visit: Payer: Medicare Other | Admitting: Family Medicine

## 2022-02-20 DIAGNOSIS — M79641 Pain in right hand: Secondary | ICD-10-CM | POA: Diagnosis not present

## 2022-02-21 ENCOUNTER — Ambulatory Visit (INDEPENDENT_AMBULATORY_CARE_PROVIDER_SITE_OTHER): Payer: Medicare Other | Admitting: Family Medicine

## 2022-02-21 VITALS — BP 131/78 | HR 80 | Wt 114.0 lb

## 2022-02-21 DIAGNOSIS — I700 Atherosclerosis of aorta: Secondary | ICD-10-CM

## 2022-02-21 DIAGNOSIS — E559 Vitamin D deficiency, unspecified: Secondary | ICD-10-CM | POA: Diagnosis not present

## 2022-02-21 DIAGNOSIS — Z780 Asymptomatic menopausal state: Secondary | ICD-10-CM | POA: Diagnosis not present

## 2022-02-21 DIAGNOSIS — E041 Nontoxic single thyroid nodule: Secondary | ICD-10-CM

## 2022-02-21 DIAGNOSIS — M5137 Other intervertebral disc degeneration, lumbosacral region: Secondary | ICD-10-CM

## 2022-02-21 DIAGNOSIS — M810 Age-related osteoporosis without current pathological fracture: Secondary | ICD-10-CM | POA: Diagnosis not present

## 2022-02-21 NOTE — Progress Notes (Signed)
?  ? ? ?Established patient visit ? ? ?Patient: Alexis Medina   DOB: 1946-06-28   76 y.o. Female  MRN: [id number] ?Visit Date: 02/21/2022 ? ?Today's healthcare provider: [name], MD  ? ?No chief complaint on file. ? ?Subjective  ?  ?HPI  ?Patient is a 76 year old female who presents for evaluation of lump on thyroid found by her dentist.  Patient states she has soreness on the right side of her neck but no sore throat.  She reports occasional dysphagia and notes her voice comes and goes.  ? ?Medications: ?Outpatient Medications Prior to Visit  ?Medication Sig  ? alendronate (FOSAMAX) 70 MG tablet TAKE 1 TABLET EVERY 7 DAYS WITH A FULL GLASS OF WATER ON AN EMPTY STOMACH  ? aspirin EC 81 MG tablet Take 1 tablet (81 mg) by mouth once daily. Swallow whole.  ? cholecalciferol (VITAMIN D) 1000 UNITS tablet Take 1,000 Units by mouth daily.  ? cyclobenzaprine (FLEXERIL) 5 MG tablet TAKE 1 TABLET BY MOUTH EVERYDAY AT BEDTIME  ? Docosahexaenoic Acid (DHA OMEGA 3) 100 MG CAPS TAKE 2 EACH BY MOUTH DAILY.  ? estrogen, conjugated,-medroxyprogesterone (PREMPRO) 0.3-1.5 MG tablet Take 1 tablet by mouth daily.  ? famotidine (PEPCID) 20 MG tablet TAKE 1 TABLET TWICE A DAY  ? ibuprofen (ADVIL,MOTRIN) 800 MG tablet Take 1 tablet (800 mg total) by mouth every 8 (eight) hours as needed.  ? Multiple Vitamin (MULTIVITAMIN) capsule Take 1 capsule by mouth daily.  ? RESTASIS 0.05 % ophthalmic emulsion Place 1 drop into both eyes 2 (two) times daily.  ? tazarotene (AVAGE) 0.1 % cream Apply topically at bedtime. For acne  ? traMADol (ULTRAM) 50 MG tablet TAKE 1 TABLET TWICE A DAY AS NEEDED  ? ?No facility-administered medications prior to visit.  ? ? ?Review of Systems ? ? ?  Objective  ?  ?There were no vitals taken for this visit. ? ? ?Physical Exam ?Vitals reviewed.  ?Constitutional:   ?   General: She is not in acute distress. ?   Appearance: She is well-developed.  ?HENT:  ?   Head: Normocephalic and atraumatic.  ?   Right Ear:  Hearing normal.  ?   Left Ear: Hearing normal.  ?   Nose: Nose normal.  ?Eyes:  ?   General: Lids are normal. No scleral icterus.    ?   Right eye: No discharge.     ?   Left eye: No discharge.  ?   Conjunctiva/sclera: Conjunctivae normal.  ?Neck:  ?   Comments: Small right-sided thyroid nodule on exam. ?Cardiovascular:  ?   Rate and Rhythm: Normal rate and regular rhythm.  ?   Heart sounds: Normal heart sounds.  ?Pulmonary:  ?   Effort: Pulmonary effort is normal. No respiratory distress.  ?Musculoskeletal:  ?   Cervical back: Neck supple. No tenderness.  ?Skin: ?   Findings: No lesion or rash.  ?Neurological:  ?   General: No focal deficit present.  ?   Mental Status: She is alert and oriented to person, place, and time.  ?Psychiatric:     ?   Mood and Affect: Mood normal.     ?   Speech: Speech normal.     ?   Behavior: Behavior normal.     ?   Thought Content: Thought content normal.     ?   Judgment: Judgment normal.  ?  ? ? ?No results found for any visits on 02/21/22. ?  Assessment & Plan  ?  ? ?1. Thyroid nodule ?Obtain thyroid ultrasound. ?If patient needs referral refer to ENT Dr.Juengal. ?- US THYROID; Future ?- TSH ? ?2. DDD (degenerative disc disease), lumbosacral ? ? ?3. Vitamin D deficiency ? ? ?4. Post-menopausal ? ? ?5. Age-related osteoporosis without current pathological fracture ?Continue to walk regularly and take calcium and vitamin D daily ? ? ?No follow-ups on file.  ?   ? ?I, [name], MD, have reviewed all documentation for this visit. The documentation on 02/28/22 for the exam, diagnosis, procedures, and orders are all accurate and complete. ? ? ? ?Deontay Ladnier Cranford Mon, MD  ?[facility] ?[phone] (phone) ?[phone] (fax) ? ?[facility] ?

## 2022-02-22 LAB — TSH: TSH: 1.17 u[IU]/mL (ref 0.450–4.500)

## 2022-02-27 ENCOUNTER — Ambulatory Visit
Admission: RE | Admit: 2022-02-27 | Discharge: 2022-02-27 | Disposition: A | Discharge status: Home / Self Care | Payer: Medicare Other | Source: Ambulatory Visit | Attending: Family Medicine | Admitting: Family Medicine

## 2022-02-27 DIAGNOSIS — E041 Nontoxic single thyroid nodule: Secondary | ICD-10-CM | POA: Insufficient documentation

## 2022-03-01 ENCOUNTER — Telehealth: Payer: Self-pay

## 2022-03-01 ENCOUNTER — Other Ambulatory Visit: Payer: Self-pay | Admitting: *Deleted

## 2022-03-01 DIAGNOSIS — E041 Nontoxic single thyroid nodule: Secondary | ICD-10-CM

## 2022-03-01 NOTE — Telephone Encounter (Signed)
Referral was ordered this morning. Patient was advised. ?

## 2022-03-01 NOTE — Telephone Encounter (Signed)
Patients husband came by the office today to ask if we would refer Niaja to Dr. Charolett Bumpers to "biopsy her thyroid nodule" since her ultrasound showed 3  nodules. Please let patient know.

## 2022-03-06 DIAGNOSIS — M79641 Pain in right hand: Secondary | ICD-10-CM | POA: Diagnosis not present

## 2022-03-13 DIAGNOSIS — M79641 Pain in right hand: Secondary | ICD-10-CM | POA: Diagnosis not present

## 2022-03-25 DIAGNOSIS — E041 Nontoxic single thyroid nodule: Secondary | ICD-10-CM | POA: Diagnosis not present

## 2022-03-26 ENCOUNTER — Other Ambulatory Visit: Payer: Self-pay | Admitting: Otolaryngology

## 2022-03-26 ENCOUNTER — Other Ambulatory Visit: Payer: Self-pay

## 2022-03-26 ENCOUNTER — Telehealth: Payer: Self-pay | Admitting: Family Medicine

## 2022-03-26 DIAGNOSIS — E041 Nontoxic single thyroid nodule: Secondary | ICD-10-CM

## 2022-03-26 DIAGNOSIS — M5137 Other intervertebral disc degeneration, lumbosacral region: Secondary | ICD-10-CM

## 2022-03-26 DIAGNOSIS — M51379 Other intervertebral disc degeneration, lumbosacral region without mention of lumbar back pain or lower extremity pain: Secondary | ICD-10-CM

## 2022-03-26 NOTE — Telephone Encounter (Signed)
Converted to refill request

## 2022-03-26 NOTE — Telephone Encounter (Signed)
LOV 02/21/22 NOV none LRF 08/21/21 135 x 5

## 2022-03-26 NOTE — Telephone Encounter (Signed)
[facility] faxed refill request for the following medications:  traMADol (ULTRAM) 50 MG tablet   Please advise.

## 2022-03-27 ENCOUNTER — Other Ambulatory Visit: Payer: Self-pay | Admitting: Family Medicine

## 2022-03-27 DIAGNOSIS — Z4789 Encounter for other orthopedic aftercare: Secondary | ICD-10-CM | POA: Diagnosis not present

## 2022-03-27 DIAGNOSIS — M720 Palmar fascial fibromatosis [Dupuytren]: Secondary | ICD-10-CM | POA: Diagnosis not present

## 2022-03-27 DIAGNOSIS — M5137 Other intervertebral disc degeneration, lumbosacral region: Secondary | ICD-10-CM

## 2022-03-27 DIAGNOSIS — M79641 Pain in right hand: Secondary | ICD-10-CM | POA: Diagnosis not present

## 2022-03-27 MED ORDER — TRAMADOL HCL 50 MG PO TABS: 50.0000 mg | ORAL_TABLET | Freq: Four times a day (QID) | ORAL | 0 refills | Status: DC | PRN

## 2022-04-02 ENCOUNTER — Other Ambulatory Visit: Payer: Self-pay | Admitting: Gastroenterology

## 2022-04-03 ENCOUNTER — Other Ambulatory Visit: Payer: Self-pay | Admitting: Family Medicine

## 2022-04-03 ENCOUNTER — Ambulatory Visit
Admission: RE | Admit: 2022-04-03 | Discharge: 2022-04-03 | Disposition: A | Discharge status: Home / Self Care | Payer: Medicare Other | Source: Ambulatory Visit | Attending: Otolaryngology | Admitting: Otolaryngology

## 2022-04-03 DIAGNOSIS — M5137 Other intervertebral disc degeneration, lumbosacral region: Secondary | ICD-10-CM

## 2022-04-03 DIAGNOSIS — E041 Nontoxic single thyroid nodule: Secondary | ICD-10-CM | POA: Diagnosis not present

## 2022-04-03 MED ORDER — TRAMADOL HCL 50 MG PO TABS: 50.0000 mg | ORAL_TABLET | Freq: Four times a day (QID) | ORAL | 3 refills | Status: DC | PRN

## 2022-04-03 NOTE — Discharge Instructions (Signed)
Needle Aspiration, Care After The following information offers guidance on how to care for yourself after your procedure. Your health care provider may also give you more specific instructions. If you have problems or questions, contact your health care provider. What can I expect after the procedure? After the procedure, it is common to have: Soreness, pain, and tenderness where your aspiration was performed.  Bruising or mild pain at the aspiration site.  These symptoms should go away after a few days. Follow these instructions at home: Biopsy site care  Follow instructions from your health care provider about how to take care of your aspiration site. Make sure you: Wash your hands with soap and water for at least 20 seconds before and after you change your bandage (dressing). If soap and water are not available, use hand sanitizer. Remove dressing tomorrow Check your puncture site every day for signs of infection. Check for: More redness, swelling, or pain. More drainage of fluid or blood. More warmth. Pus or a bad smell. General instructions Rest as told by your health care provider. Do not take baths, swim, or use a hot tub for 1 week.  You may shower tomorrow. Take over-the-counter and prescription medicines only as told by your health care provider. Return to your normal activities tomorrow.  If you have airplane travel scheduled, talk with your health care provider about when it is safe for you to travel by airplane. It is up to you to get the results of your procedure. Ask your health care provider, or the department that is doing the procedure, when your results will be ready. Keep all follow-up visits.   Contact a health care provider if: You have a fever. You have more redness, swelling, or pain at the puncture site that lasts longer than a few days. You have more fluid or blood coming from your puncture site. You have pus or a bad smell coming from your puncture site. Your  puncture site feels warm to the touch. You have pain that does not get better with medicine. Get help right away if: You have severe bleeding from the puncture site. You have chest pain. You have problems breathing. You cough up blood. You faint. You have a very fast heart rate. These symptoms may be an emergency. Get help right away. Call 911. Do not wait to see if the symptoms will go away. Do not drive yourself to the hospital. Summary After the procedure, it is common to have soreness, bruising, tenderness, or mild pain at the aspiration site.  These symptoms should go away in a few days. Check your aspiration site every day for signs of infection, such as more redness, swelling, or pain. Do not take baths, swim, or use a hot tub for one week. Ask your health care provider if you may take showers. Contact a heath care provider if you have more redness, swelling, or pain at the puncture site that lasts longer than a few days. This information is not intended to replace advice given to you by your health care provider. Make sure you discuss any questions you have with your health care provider. Document Revised: 10/03/2021 Document Reviewed: 10/03/2021 Elsevier Patient Education  [place].

## 2022-04-03 NOTE — Telephone Encounter (Signed)
Patient is requesting Tramadol presc to be sent in to her mail order pharmacy please.  This was faxed over on 03/26/22. She got an emergency refill at [facility] locally but needs the mail order one sent so they will have time to process it and get it mailed out to her.   ([facility])

## 2022-04-04 LAB — CYTOLOGY - NON PAP

## 2022-04-08 ENCOUNTER — Other Ambulatory Visit: Payer: Self-pay | Admitting: Otolaryngology

## 2022-04-08 DIAGNOSIS — E041 Nontoxic single thyroid nodule: Secondary | ICD-10-CM

## 2022-04-26 ENCOUNTER — Other Ambulatory Visit: Payer: Self-pay | Admitting: Obstetrics and Gynecology

## 2022-04-26 DIAGNOSIS — Z79890 Hormone replacement therapy (postmenopausal): Secondary | ICD-10-CM

## 2022-05-16 ENCOUNTER — Other Ambulatory Visit: Payer: Self-pay | Admitting: Family Medicine

## 2022-06-04 ENCOUNTER — Other Ambulatory Visit: Payer: Self-pay | Admitting: Family Medicine

## 2022-06-04 DIAGNOSIS — M25559 Pain in unspecified hip: Secondary | ICD-10-CM

## 2022-06-04 DIAGNOSIS — M5440 Lumbago with sciatica, unspecified side: Secondary | ICD-10-CM

## 2022-06-04 NOTE — Telephone Encounter (Signed)
Requested medication (s) are due for refill today: no  Requested medication (s) are on the active medication list: no  Last refill:  02/27/22  Future visit scheduled: no  Notes to clinic:  rx was dc'd on 02/21/22. Not delegated.      Requested Prescriptions  Pending Prescriptions Disp Refills   cyclobenzaprine (FLEXERIL) 5 MG tablet [Pharmacy Med Name: CYCLOBENZAPRINE 5 MG TABLET] 90 tablet 1    Sig: TAKE 1 TABLET BY MOUTH EVERYDAY AT BEDTIME     Not Delegated - Analgesics:  Muscle Relaxants Failed - 06/04/2022  9:36 AM      Failed - This refill cannot be delegated      Passed - Valid encounter within last 6 months    Recent Outpatient Visits           3 months ago Thyroid nodule   [facility] Jerrol Banana., MD   9 months ago Encounter for annual wellness exam in Medicare patient   [facility] Jerrol Banana., MD   1 year ago Other fatigue   [facility] Jerrol Banana., MD   1 year ago Low back pain with sciatica, sciatica laterality unspecified, unspecified back pain laterality, unspecified chronicity   [facility] Jerrol Banana., MD   1 year ago Right foot pain   [facility] Jerrol Banana., MD

## 2022-06-06 ENCOUNTER — Encounter: Payer: Self-pay | Admitting: Family Medicine

## 2022-06-06 ENCOUNTER — Other Ambulatory Visit: Payer: Self-pay

## 2022-06-06 ENCOUNTER — Other Ambulatory Visit: Payer: Self-pay | Admitting: Family Medicine

## 2022-06-06 DIAGNOSIS — M5137 Other intervertebral disc degeneration, lumbosacral region: Secondary | ICD-10-CM

## 2022-06-06 MED ORDER — TRAMADOL HCL 50 MG PO TABS: 50.0000 mg | ORAL_TABLET | Freq: Four times a day (QID) | ORAL | 1 refills | Status: AC | PRN

## 2022-06-06 MED ORDER — CYCLOBENZAPRINE HCL 5 MG PO TABS: 5.0000 mg | ORAL_TABLET | Freq: Every day | ORAL | 1 refills | Status: DC

## 2022-06-10 DIAGNOSIS — M720 Palmar fascial fibromatosis [Dupuytren]: Secondary | ICD-10-CM | POA: Diagnosis not present

## 2022-06-10 DIAGNOSIS — M79641 Pain in right hand: Secondary | ICD-10-CM | POA: Diagnosis not present

## 2022-06-12 ENCOUNTER — Ambulatory Visit: Payer: TRICARE For Life (TFL) | Admitting: Podiatry

## 2022-06-12 DIAGNOSIS — M2041 Other hammer toe(s) (acquired), right foot: Secondary | ICD-10-CM

## 2022-06-17 ENCOUNTER — Encounter: Payer: Self-pay | Admitting: Podiatry

## 2022-06-17 ENCOUNTER — Ambulatory Visit (INDEPENDENT_AMBULATORY_CARE_PROVIDER_SITE_OTHER): Payer: Medicare Other | Admitting: Podiatry

## 2022-06-17 ENCOUNTER — Ambulatory Visit (INDEPENDENT_AMBULATORY_CARE_PROVIDER_SITE_OTHER): Payer: Medicare Other

## 2022-06-17 DIAGNOSIS — D2371 Other benign neoplasm of skin of right lower limb, including hip: Secondary | ICD-10-CM

## 2022-06-17 DIAGNOSIS — M778 Other enthesopathies, not elsewhere classified: Secondary | ICD-10-CM | POA: Diagnosis not present

## 2022-06-17 DIAGNOSIS — M7989 Other specified soft tissue disorders: Secondary | ICD-10-CM

## 2022-06-17 DIAGNOSIS — M7751 Other enthesopathy of right foot: Secondary | ICD-10-CM

## 2022-06-17 DIAGNOSIS — M5116 Intervertebral disc disorders with radiculopathy, lumbar region: Secondary | ICD-10-CM | POA: Insufficient documentation

## 2022-06-17 DIAGNOSIS — M419 Scoliosis, unspecified: Secondary | ICD-10-CM | POA: Insufficient documentation

## 2022-06-17 DIAGNOSIS — M48061 Spinal stenosis, lumbar region without neurogenic claudication: Secondary | ICD-10-CM | POA: Insufficient documentation

## 2022-06-17 NOTE — Progress Notes (Signed)
Subjective:  Patient ID: Alexis Medina, female    DOB: 02/19/1946,  MRN: [id number] HPI Chief Complaint  Patient presents with   Foot Pain    5th MPJ right - swollen, aching x several months, seen podiatry, but no treatment done, AM pain plantar forefoot, certain shoes uncomfortable, tried tylenol, elevating, small, callused area plantarly   New Patient (Initial Visit)    76 y.o. female presents with the above complaint.   ROS: Denies fever chills nausea vomiting muscle aches pains calf pain back pain chest pain shortness of breath.  Past Medical History:  Diagnosis Date   Arthritis    Cataract    Concussion    GERD (gastroesophageal reflux disease)    History of Helicobacter pylori infection    Hormone replacement therapy    Hyperlipidemia    Osteopenia    Osteoporosis    Past Surgical History:  Procedure Laterality Date   LUMBAR DISC SURGERY     TONSILLECTOMY     TUBAL LIGATION     UPPER GI ENDOSCOPY      Current Outpatient Medications:    cyclobenzaprine (FLEXERIL) 5 MG tablet, Take 1 tablet (5 mg total) by mouth at bedtime. As needed, Disp: 90 tablet, Rfl: 1   alendronate (FOSAMAX) 70 MG tablet, TAKE 1 TABLET EVERY 7 DAYS WITH A FULL GLASS OF WATER ON AN EMPTY STOMACH, Disp: 12 tablet, Rfl: 3   aspirin EC 81 MG tablet, Take 1 tablet (81 mg) by mouth once daily. Swallow whole., Disp: , Rfl:    cholecalciferol (VITAMIN D) 1000 UNITS tablet, Take 1,000 Units by mouth daily., Disp: , Rfl:    Docosahexaenoic Acid (DHA OMEGA 3) 100 MG CAPS, TAKE 2 EACH BY MOUTH DAILY., Disp: 60 capsule, Rfl: 12   famotidine (PEPCID) 20 MG tablet, TAKE 1 TABLET TWICE A DAY, Disp: 180 tablet, Rfl: 0   ibuprofen (ADVIL,MOTRIN) 800 MG tablet, Take 1 tablet (800 mg total) by mouth every 8 (eight) hours as needed., Disp: 90 tablet, Rfl: 3   Multiple Vitamin (MULTIVITAMIN) capsule, Take 1 capsule by mouth daily., Disp: , Rfl:    PREMPRO 0.3-1.5 MG tablet, TAKE 1 TABLET DAILY, Disp: 84 tablet, Rfl:  0   RESTASIS 0.05 % ophthalmic emulsion, Place 1 drop into both eyes 2 (two) times daily., Disp: , Rfl:    tazarotene (AVAGE) 0.1 % cream, Apply topically at bedtime. For acne, Disp: , Rfl:    traMADol (ULTRAM) 50 MG tablet, Take 1 tablet (50 mg total) by mouth every 6 (six) hours as needed., Disp: 200 tablet, Rfl: 1  No Known Allergies Review of Systems Objective:  There were no vitals filed for this visit.  General: Well developed, nourished, in no acute distress, alert and oriented x3   Dermatological: Skin is warm, dry and supple bilateral. Nails x 10 are well maintained; remaining integument appears unremarkable at this time. There are no open sores, no preulcerative lesions, no rash or signs of infection present.  Benign skin lesions plantar aspect of the fifth metatarsal of the right foot.  Vascular: Dorsalis Pedis artery and Posterior Tibial artery pedal pulses are 2/4 bilateral with immedate capillary fill time. Pedal hair growth present. No varicosities and no lower extremity edema present bilateral.   Neruologic: Grossly intact via light touch bilateral. Vibratory intact via tuning fork bilateral. Protective threshold with Semmes Wienstein monofilament intact to all pedal sites bilateral. Patellar and Achilles deep tendon reflexes 2+ bilateral. No Babinski or clonus noted bilateral.   Musculoskeletal:   No gross boney pedal deformities bilateral. No pain, crepitus, or limitation noted with foot and ankle range of motion bilateral. Muscular strength 5/5 in all groups tested bilateral.  Mild tailor's bunion deformities bilateral soft tissue swelling plantar and plantar lateral aspect of the fifth metatarsal of the right foot which is very firm.  It appears to be slightly modulated.  Gait: Unassisted, Nonantalgic.    Radiographs:  Radiographs taken today do demonstrate some osseous abnormality of the fifth metatarsal area of the right foot does demonstrate a mild tailor's bunion  deformity.  She also demonstrates significant soft tissue swelling and retention of fluid or density in that area.  Assessment & Plan:   Assessment: Soft tissue mass plantar lateral aspect fifth met right benign skin lesions fifth met head right and tailor's bunion deformity right.    Plan: At this point were going to request an MRI of the right foot due to the soft tissue mass of unknown etiology that is rapidly growing.  We are going to request MRI with contrast.  Once this comes then we will notify her immediately this is for surgical consideration as well as differential diagnoses.     Mamoudou Mulvehill T. [place], [place]

## 2022-07-01 ENCOUNTER — Other Ambulatory Visit: Payer: Self-pay | Admitting: Gastroenterology

## 2022-07-02 ENCOUNTER — Ambulatory Visit
Admission: RE | Admit: 2022-07-02 | Discharge: 2022-07-02 | Disposition: A | Discharge status: Home / Self Care | Payer: Medicare Other | Source: Ambulatory Visit | Attending: Podiatry | Admitting: Podiatry

## 2022-07-02 DIAGNOSIS — G5761 Lesion of plantar nerve, right lower limb: Secondary | ICD-10-CM | POA: Diagnosis not present

## 2022-07-02 DIAGNOSIS — M7989 Other specified soft tissue disorders: Secondary | ICD-10-CM | POA: Diagnosis not present

## 2022-07-02 DIAGNOSIS — M25474 Effusion, right foot: Secondary | ICD-10-CM | POA: Diagnosis not present

## 2022-07-02 DIAGNOSIS — R600 Localized edema: Secondary | ICD-10-CM | POA: Diagnosis not present

## 2022-07-02 MED ORDER — GADOBENATE DIMEGLUMINE 529 MG/ML IV SOLN
10.0000 mL | Freq: Once | INTRAVENOUS | Status: AC | PRN
  Administered 2022-07-02: 10 mL via INTRAVENOUS

## 2022-07-08 ENCOUNTER — Telehealth: Payer: Self-pay

## 2022-07-09 NOTE — Telephone Encounter (Signed)
No further notes needed

## 2022-07-09 NOTE — Progress Notes (Unsigned)
ANNUAL PREVENTATIVE CARE GYNECOLOGY  ENCOUNTER NOTE  Subjective:       Alexis Medina is a 76 y.o. G57P1011 female here for a routine annual gynecologic exam. The patient {is/is not/has never been:13135} sexually active. The patient {is/is not:13135} taking hormone replacement therapy. {post-men bleed:13152::"Patient denies post-menopausal vaginal bleeding."} The patient wears seatbelts: {yes/no:311178}. The patient participates in regular exercise: {yes/no/not asked:9010}. Has the patient ever been transfused or tattooed?: {yes/no/not asked:9010}. The patient reports that there {is/is not:9024} domestic violence in her life.  Current complaints: 1.  ***    Gynecologic History No LMP recorded. Patient is postmenopausal. Contraception: post menopausal status Last Pap: ***. Results were: {norm/abn:16337} Last mammogram: ***. Results were: {norm/abn:16337} Last Colonoscopy:  Last Dexa Scan:    Obstetric History OB History  Gravida Para Term Preterm AB Living  '2 1 1   1 1  '$ SAB IAB Ectopic Multiple Live Births  1       1    # Outcome Date GA Lbr Len/2nd Weight Sex Delivery Anes PTL Lv  2 Term 11/17/82   7 lb 10 oz (3.459 kg) M Vag-Spont   LIV  1 SAB      SAB   FD    Past Medical History:  Diagnosis Date   Arthritis    Cataract    Concussion    GERD (gastroesophageal reflux disease)    History of Helicobacter pylori infection    Hormone replacement therapy    Hyperlipidemia    Osteopenia    Osteoporosis     Family History  Problem Relation Age of Onset   Heart failure Father    Alzheimer's disease Mother    Heart failure Mother    Heart disease Brother    Hypertension Brother    Stroke Brother    Atrial fibrillation Brother    Breast cancer Cousin    Healthy Son     Past Surgical History:  Procedure Laterality Date   LUMBAR DISC SURGERY     TONSILLECTOMY     TUBAL LIGATION     UPPER GI ENDOSCOPY      Social History   Socioeconomic History   Marital  status: Married    Spouse name: Not on file   Number of children: 1   Years of education: Not on file   Highest education level: Bachelor's degree (e.g., BA, AB, BS)  Occupational History   Occupation: Therapist, 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[employer]    Comment: retired  Tobacco Use   Smoking status: Never   Smokeless tobacco: Never  Vaping Use   Vaping Use: Never used  Substance and Sexual Activity   Alcohol use: Not Currently    Comment: glass of wine once a year   Drug use: No   Sexual activity: Yes    Birth control/protection: None, Surgical  Other Topics Concern   Not on file  Social History Narrative   Not on file   Social Determinants of Health   Financial Resource Strain: Low Risk  (01/22/2021)   Overall Financial Resource Strain (CARDIA)    Difficulty of Paying Living Expenses: Not hard at all  Food Insecurity: No Food Insecurity (01/22/2021)   Hunger Vital Sign    Worried About Running Out of Food in the Last Year: Never true    Jane in the Last Year: Never true  Transportation Needs: No Transportation Needs (01/22/2021)   PRAPARE - Transportation    Lack of Transportation (Medical): No    Lack  of Transportation (Non-Medical): No  Physical Activity: Inactive (01/22/2021)   Exercise Vital Sign    Days of Exercise per Week: 0 days    Minutes of Exercise per Session: 0 min  Stress: No Stress Concern Present (01/22/2021)   [place]    Feeling of Stress : Not at all  Social Connections: Socially Integrated (01/22/2021)   Social Connection and Isolation Panel [NHANES]    Frequency of Communication with Friends and Family: More than three times a week    Frequency of Social Gatherings with Friends and Family: Three times a week    Attends Religious Services: More than 4 times per year    Active Member of Clubs or Organizations: Yes    Attends Archivist Meetings: More than 4 times per year    Marital Status: Married   Human resources officer Violence: Not At Risk (01/22/2021)   Humiliation, Afraid, Rape, and Kick questionnaire    Fear of Current or Ex-Partner: No    Emotionally Abused: No    Physically Abused: No    Sexually Abused: No    Current Outpatient Medications on File Prior to Visit  Medication Sig Dispense Refill   cyclobenzaprine (FLEXERIL) 5 MG tablet Take 1 tablet (5 mg total) by mouth at bedtime. As needed 90 tablet 1   alendronate (FOSAMAX) 70 MG tablet TAKE 1 TABLET EVERY 7 DAYS WITH A FULL GLASS OF WATER ON AN EMPTY STOMACH 12 tablet 3   aspirin EC 81 MG tablet Take 1 tablet (81 mg) by mouth once daily. Swallow whole.     cholecalciferol (VITAMIN D) 1000 UNITS tablet Take 1,000 Units by mouth daily.     Docosahexaenoic Acid (DHA OMEGA 3) 100 MG CAPS TAKE 2 EACH BY MOUTH DAILY. 60 capsule 12   famotidine (PEPCID) 20 MG tablet TAKE 1 TABLET TWICE A DAY (PLEASE CALL [phone] TO SCHEDULE AN OFFICE VISIT FOR FURTHER REFILLS) 60 tablet 0   ibuprofen (ADVIL,MOTRIN) 800 MG tablet Take 1 tablet (800 mg total) by mouth every 8 (eight) hours as needed. 90 tablet 3   Multiple Vitamin (MULTIVITAMIN) capsule Take 1 capsule by mouth daily.     PREMPRO 0.3-1.5 MG tablet TAKE 1 TABLET DAILY 84 tablet 0   RESTASIS 0.05 % ophthalmic emulsion Place 1 drop into both eyes 2 (two) times daily.     tazarotene (AVAGE) 0.1 % cream Apply topically at bedtime. For acne     traMADol (ULTRAM) 50 MG tablet Take 1 tablet (50 mg total) by mouth every 6 (six) hours as needed. 200 tablet 1   No current facility-administered medications on file prior to visit.    No Known Allergies    Review of Systems ROS Review of Systems - General ROS: negative for - chills, fatigue, fever, hot flashes, night sweats, weight gain or weight loss Psychological ROS: negative for - anxiety, decreased libido, depression, mood swings, physical abuse or sexual abuse Ophthalmic ROS: negative for - blurry vision, eye pain or loss of  vision ENT ROS: negative for - headaches, hearing change, visual changes or vocal changes Allergy and Immunology ROS: negative for - hives, itchy/watery eyes or seasonal allergies Hematological and Lymphatic ROS: negative for - bleeding problems, bruising, swollen lymph nodes or weight loss Endocrine ROS: negative for - galactorrhea, hair pattern changes, hot flashes, malaise/lethargy, mood swings, palpitations, polydipsia/polyuria, skin changes, temperature intolerance or unexpected weight changes Breast ROS: negative for - new or changing breast lumps or  nipple discharge Respiratory ROS: negative for - cough or shortness of breath Cardiovascular ROS: negative for - chest pain, irregular heartbeat, palpitations or shortness of breath Gastrointestinal ROS: no abdominal pain, change in bowel habits, or black or bloody stools Genito-Urinary ROS: no dysuria, trouble voiding, or hematuria Musculoskeletal ROS: negative for - joint pain or joint stiffness Neurological ROS: negative for - bowel and bladder control changes Dermatological ROS: negative for rash and skin lesion changes   Objective:   There were no vitals taken for this visit. CONSTITUTIONAL: Well-developed, well-nourished female in no acute distress.  PSYCHIATRIC: Normal mood and affect. Normal behavior. Normal judgment and thought content. [place]: Alert and oriented to person, place, and time. Normal muscle tone coordination. No cranial nerve deficit noted. HENT:  Normocephalic, atraumatic, External right and left ear normal. Oropharynx is clear and moist EYES: Conjunctivae and EOM are normal. Pupils are equal, round, and reactive to light. No scleral icterus.  NECK: Normal range of motion, supple, no masses.  Normal thyroid.  SKIN: Skin is warm and dry. No rash noted. Not diaphoretic. No erythema. No pallor. CARDIOVASCULAR: Normal heart rate noted, regular rhythm, no murmur. RESPIRATORY: Clear to auscultation bilaterally. Effort  and breath sounds normal, no problems with respiration noted. BREASTS: Symmetric in size. No masses, skin changes, nipple drainage, or lymphadenopathy. ABDOMEN: Soft, normal bowel sounds, no distention noted.  No tenderness, rebound or guarding.  BLADDER: Normal PELVIC:  Bladder {:311640}  Urethra: {:311719}  Vulva: {:311722}  Vagina: {:311643}  Cervix: {:311644}  Uterus: {:311718}  Adnexa: {:311645}  RV: {Blank multiple:19196::"External Exam NormaI","No Rectal Masses","Normal Sphincter tone"}  MUSCULOSKELETAL: Normal range of motion. No tenderness.  No cyanosis, clubbing, or edema.  2+ distal pulses. LYMPHATIC: No Axillary, Supraclavicular, or Inguinal Adenopathy.   Labs: Lab Results  Component Value Date   WBC 6.1 02/19/2021   HGB 12.8 02/19/2021   HCT 40.2 02/19/2021   MCV 94 02/19/2021   PLT 258 02/19/2021    Lab Results  Component Value Date   CREATININE 0.76 08/23/2021   BUN 11 08/23/2021   NA 140 08/23/2021   K 4.9 08/23/2021   CL 101 08/23/2021   CO2 25 08/23/2021    Lab Results  Component Value Date   ALT 12 08/23/2021   AST 21 08/23/2021   ALKPHOS 51 08/23/2021   BILITOT 0.5 08/23/2021    Lab Results  Component Value Date   CHOL 206 (H) 08/23/2021   HDL 88 08/23/2021   LDLCALC 108 (H) 08/23/2021   TRIG 53 08/23/2021   CHOLHDL 2.4 02/19/2021    Lab Results  Component Value Date   TSH 1.170 02/21/2022    No results found for: "HGBA1C"   Assessment:   No diagnosis found.   Plan:  Pap: {Blank multiple:19196::"Pap, Reflex if ASCUS","Pap Co Test","GC/CT NAAT","Not needed","Not done"} Mammogram: {Blank multiple:19196::"***","Ordered","Not Ordered","Not Indicated"} Colon Screening:  {Blank multiple:19196::"***","Ordered","Not Ordered","Not Indicated"} Labs: {Blank multiple:19196::"Lipid 1","FBS","TSH","Hemoglobin A1C","Vit D Level""***"} Routine preventative health maintenance measures emphasized: {Blank multiple:19196::"Exercise/Diet/Weight  control","Tobacco Warnings","Alcohol/Substance use risks","Stress Management","Peer Pressure Issues","Safe Sex"} COVID Vaccination status: Return to [place], [place]

## 2022-07-09 NOTE — Telephone Encounter (Signed)
Pt scheduled to see Dr Milinda Pointer 9.20.23 in [place].

## 2022-07-09 NOTE — Telephone Encounter (Signed)
Noted, thanks!

## 2022-07-10 ENCOUNTER — Encounter: Payer: Self-pay | Admitting: Podiatry

## 2022-07-10 ENCOUNTER — Ambulatory Visit (INDEPENDENT_AMBULATORY_CARE_PROVIDER_SITE_OTHER): Payer: Medicare Other | Admitting: Obstetrics and Gynecology

## 2022-07-10 ENCOUNTER — Ambulatory Visit (INDEPENDENT_AMBULATORY_CARE_PROVIDER_SITE_OTHER): Payer: Medicare Other | Admitting: Podiatry

## 2022-07-10 ENCOUNTER — Encounter: Payer: Self-pay | Admitting: Obstetrics and Gynecology

## 2022-07-10 VITALS — BP 145/78 | HR 78 | Resp 16 | Ht 63.0 in | Wt 115.2 lb

## 2022-07-10 DIAGNOSIS — Z79890 Hormone replacement therapy (postmenopausal): Secondary | ICD-10-CM

## 2022-07-10 DIAGNOSIS — N952 Postmenopausal atrophic vaginitis: Secondary | ICD-10-CM | POA: Diagnosis not present

## 2022-07-10 DIAGNOSIS — M7989 Other specified soft tissue disorders: Secondary | ICD-10-CM

## 2022-07-10 DIAGNOSIS — Z01419 Encounter for gynecological examination (general) (routine) without abnormal findings: Secondary | ICD-10-CM

## 2022-07-10 NOTE — Progress Notes (Signed)
She presents with her husband today states that is just painful when she is referring to the fifth metatarsal phalangeal joint and states that she is anxious to hear her MRI results and to discuss our options.  Objective: Vital signs are stable she is alert and oriented x3 she has pain on palpation of a nonpulsatile mass to the fifth metatarsal phalangeal joint plantar and plantar lateral aspect.  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  Plan: Discussed etiology pathology conservative versus surgical therapies.  We did discuss opening this and removing the mass and sending for pathologic evaluation.  We did discuss the possible side effects and complications including but not limited to postop pain bleeding swell infection recurrence need for further surgery overcorrection under correction also digit loss limb loss of life.  She understands this is amenable to it signed Alexis Medina page of the consent form.  We are going to go to [facility] specialty surgical center to perform this procedure.  We dispensed a Darco shoe as well as information regarding the surgery center and I will follow-up with her in the near future for surgical intervention.  She is going to have to delay this for a few days because she is leaving for [place] for funeral.

## 2022-07-11 ENCOUNTER — Ambulatory Visit: Payer: Medicare Other | Admitting: Obstetrics and Gynecology

## 2022-07-17 ENCOUNTER — Other Ambulatory Visit: Payer: Self-pay | Admitting: Gastroenterology

## 2022-07-19 ENCOUNTER — Other Ambulatory Visit: Payer: Self-pay | Admitting: Obstetrics and Gynecology

## 2022-07-19 DIAGNOSIS — Z79890 Hormone replacement therapy (postmenopausal): Secondary | ICD-10-CM

## 2022-08-13 ENCOUNTER — Other Ambulatory Visit: Payer: Self-pay | Admitting: Family Medicine

## 2022-08-13 ENCOUNTER — Other Ambulatory Visit: Payer: Self-pay | Admitting: Obstetrics and Gynecology

## 2022-08-13 DIAGNOSIS — Z1231 Encounter for screening mammogram for malignant neoplasm of breast: Secondary | ICD-10-CM

## 2022-08-21 ENCOUNTER — Other Ambulatory Visit: Payer: Self-pay | Admitting: Podiatry

## 2022-08-21 MED ORDER — CEPHALEXIN 500 MG PO CAPS: 500.0000 mg | ORAL_CAPSULE | Freq: Three times a day (TID) | ORAL | 0 refills | Status: DC

## 2022-08-21 MED ORDER — HYDROCODONE-ACETAMINOPHEN 10-325 MG PO TABS: 1.0000 | ORAL_TABLET | Freq: Four times a day (QID) | ORAL | 0 refills | Status: AC | PRN

## 2022-08-21 MED ORDER — ONDANSETRON HCL 4 MG PO TABS: 4.0000 mg | ORAL_TABLET | Freq: Three times a day (TID) | ORAL | 0 refills | Status: DC | PRN

## 2022-08-23 ENCOUNTER — Telehealth: Payer: Self-pay | Admitting: Podiatry

## 2022-08-23 ENCOUNTER — Other Ambulatory Visit: Payer: Self-pay | Admitting: Podiatry

## 2022-08-23 DIAGNOSIS — M799 Soft tissue disorder, unspecified: Secondary | ICD-10-CM | POA: Diagnosis not present

## 2022-08-23 DIAGNOSIS — M7989 Other specified soft tissue disorders: Secondary | ICD-10-CM | POA: Diagnosis not present

## 2022-08-23 DIAGNOSIS — M7130 Other bursal cyst, unspecified site: Secondary | ICD-10-CM | POA: Diagnosis not present

## 2022-08-23 DIAGNOSIS — M71371 Other bursal cyst, right ankle and foot: Secondary | ICD-10-CM | POA: Diagnosis not present

## 2022-08-23 MED ORDER — OXYCODONE-ACETAMINOPHEN 5-325 MG PO TABS: 1.0000 | ORAL_TABLET | Freq: Four times a day (QID) | ORAL | 0 refills | Status: DC | PRN

## 2022-08-23 NOTE — Telephone Encounter (Signed)
Patient called stating she had a soft tissue mas removed today with Dr. Milinda Pointer. She noticed some bleeding on the bandage and she describes it is about the size of a quarter. She has marked it to make sure it does not get any larger. Will monitor and if any worsening to let us know. Encouraged elevation.   She is asking if she should stay in the surgical shoe while sleeping. I advised her to do so.   Recommended to call back with any changes.

## 2022-08-28 ENCOUNTER — Encounter: Payer: Self-pay | Admitting: Podiatry

## 2022-08-28 ENCOUNTER — Ambulatory Visit (INDEPENDENT_AMBULATORY_CARE_PROVIDER_SITE_OTHER): Payer: Medicare Other

## 2022-08-28 ENCOUNTER — Ambulatory Visit (INDEPENDENT_AMBULATORY_CARE_PROVIDER_SITE_OTHER): Payer: Medicare Other | Admitting: Podiatry

## 2022-08-28 DIAGNOSIS — Z23 Encounter for immunization: Secondary | ICD-10-CM | POA: Diagnosis not present

## 2022-08-28 DIAGNOSIS — Z9889 Other specified postprocedural states: Secondary | ICD-10-CM

## 2022-08-28 DIAGNOSIS — M7989 Other specified soft tissue disorders: Secondary | ICD-10-CM

## 2022-08-28 NOTE — Progress Notes (Signed)
She presents today for her first postop visit date of surgery 08/23/2022 excision soft tissue tumor to the plantar aspect first metatarsal phalangeal joint of the right foot.  States that it is being great I only had to take 1 pill.  Objective: Vital signs are stable she is alert oriented x3 pulses are strongly palpable dressed her dressing was intact with a small amount of dried blood but no purulence no drainage otherwise.  Sutures are intact margins well coapted no tenderness no cellulitic process.  Assessment: Well-healing surgical foot right.  Plan: Redressed today dressed a compressive dressing.  Keep this dry clean and elevated stay off of it is much as possible and I will follow-up with her in 1 week  Remember to thank her for the pound cake.

## 2022-09-04 ENCOUNTER — Ambulatory Visit (INDEPENDENT_AMBULATORY_CARE_PROVIDER_SITE_OTHER): Payer: Medicare Other | Admitting: Podiatry

## 2022-09-04 ENCOUNTER — Encounter: Payer: Self-pay | Admitting: Podiatry

## 2022-09-04 DIAGNOSIS — Z9889 Other specified postprocedural states: Secondary | ICD-10-CM

## 2022-09-04 DIAGNOSIS — M7989 Other specified soft tissue disorders: Secondary | ICD-10-CM

## 2022-09-04 NOTE — Progress Notes (Signed)
She presents with her husband today date of surgery 08/23/2022 excision soft tissue mass plantar aspect of the fifth metatarsal right foot.  Objective: Vital signs are stable alert oriented x3.  There is no erythema edema cellulitis drainage or odor sutures are intact margins well coapted nontender on palpation.  Assessment: Well-healing surgical foot.  Plan: Sutures removed today Steri-Strips were placed encouraged her to leave these on for the next 2 to 3 days then wash the foot thoroughly not soaking it.  She understands this and is amenable to it we will follow-up with me in 2 to 3 weeks.  I provided her with dressing material as well as Steri-Strips.

## 2022-09-05 DIAGNOSIS — L820 Inflamed seborrheic keratosis: Secondary | ICD-10-CM | POA: Diagnosis not present

## 2022-09-05 DIAGNOSIS — L814 Other melanin hyperpigmentation: Secondary | ICD-10-CM | POA: Diagnosis not present

## 2022-09-05 DIAGNOSIS — L821 Other seborrheic keratosis: Secondary | ICD-10-CM | POA: Diagnosis not present

## 2022-09-05 DIAGNOSIS — D1801 Hemangioma of skin and subcutaneous tissue: Secondary | ICD-10-CM | POA: Diagnosis not present

## 2022-09-05 DIAGNOSIS — K130 Diseases of lips: Secondary | ICD-10-CM | POA: Diagnosis not present

## 2022-09-18 ENCOUNTER — Ambulatory Visit (INDEPENDENT_AMBULATORY_CARE_PROVIDER_SITE_OTHER): Payer: Medicare Other | Admitting: Podiatry

## 2022-09-18 ENCOUNTER — Encounter: Payer: Self-pay | Admitting: Podiatry

## 2022-09-18 DIAGNOSIS — M7989 Other specified soft tissue disorders: Secondary | ICD-10-CM

## 2022-09-18 DIAGNOSIS — Z9889 Other specified postprocedural states: Secondary | ICD-10-CM

## 2022-09-18 NOTE — Progress Notes (Signed)
She presents today for follow-up excision soft tissue mass fifth metatarsal phalangeal joint right foot..  She states that it feels pretty good still cannot get into regular shoes because it is a little tender when it is tight.  Date of surgery 08/23/2022.  Objective: Vital signs stable alert oriented x 3 there is no erythema cellulitis drainage or odor she has some mild edema in the area and is mildly tender on palpation.  Assessment: Well-healing surgical foot.  Plan: I am going to recommend she continue the use of the Darco shoe until she can fit into a regular shoe also recommended contrast baths and massage therapy for the area with lotion.  She understands this and is amenable to it follow-up with me in 2 weeks

## 2022-09-19 ENCOUNTER — Ambulatory Visit
Admission: RE | Admit: 2022-09-19 | Discharge: 2022-09-19 | Disposition: A | Discharge status: Home / Self Care | Payer: Medicare Other | Source: Ambulatory Visit | Attending: Obstetrics and Gynecology | Admitting: Obstetrics and Gynecology

## 2022-09-19 DIAGNOSIS — Z1231 Encounter for screening mammogram for malignant neoplasm of breast: Secondary | ICD-10-CM | POA: Insufficient documentation

## 2022-10-02 ENCOUNTER — Ambulatory Visit (INDEPENDENT_AMBULATORY_CARE_PROVIDER_SITE_OTHER): Payer: Medicare Other | Admitting: Podiatry

## 2022-10-02 DIAGNOSIS — M7989 Other specified soft tissue disorders: Secondary | ICD-10-CM

## 2022-10-02 DIAGNOSIS — Z9889 Other specified postprocedural states: Secondary | ICD-10-CM

## 2022-10-02 NOTE — Progress Notes (Signed)
She presents today for her postop visit #4 date of surgery August 23, 2022 excision soft tissue mass plantar aspect fifth metatarsal of the right foot.  States that is feeling pretty good in regular shoes again.  Objective: Vital signs stable alert oriented x 3 surgical foot right appears to be healing very nicely at the surgical site.  Mildly edematous mildly erythematous but no signs of infection.  Assessment: Well-healing surgical foot right.  Questionable area to the contralateral foot as to whether or not this very well could be another area of fibrosis bursitis type lesion beneath the fifth metatarsal.  Plan: Discussed etiology pathology and surgical therapies I also went ahead and recommended different ways that she can help offload the fifth metatarsal laterally she understands this and is amendable to it her husband can help her with that unguinal follow-up with her on an as-needed basis.

## 2022-10-31 IMAGING — MR MR LUMBAR SPINE WO/W CM
5 of 7 series · 34 of 48 positions shown · IV contrast (gadavist)
Comparison: No recent imaging, prior is from 0112

CLINICAL DATA: Low back pain with sciatica

EXAM:
MRI LUMBAR SPINE WITHOUT AND WITH CONTRAST
TECHNIQUE: Multiplanar and multiecho pulse sequences of the lumbar spine were
obtained without and with intravenous contrast.
CONTRAST:  5mL GADAVIST GADOBUTROL 1 MMOL/ML IV SOLN

[Series 5: T2 · sagittal · 4.0mm · 0.81mm/px · 6 of 17 slices shown (1 of 2)]
[im 1/17]
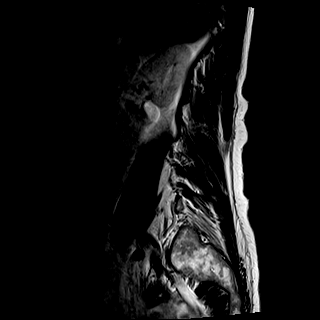
[im 4/17]
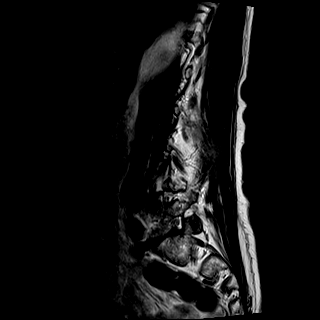
[im 7/17]
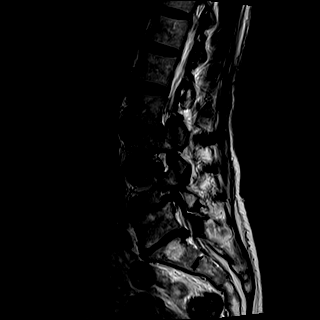
[im 10/17]
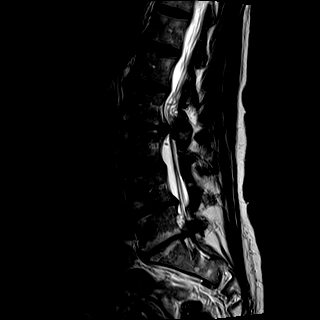
[im 13/17]
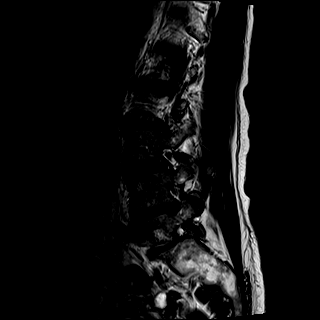
[im 17/17]
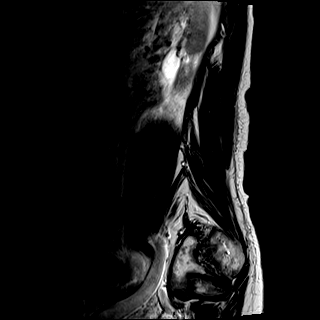

[Series 6: T1 · sagittal · 4.0mm · 0.81mm/px · 5 of 17 slices shown (1 of 2)]
[im 1/17]
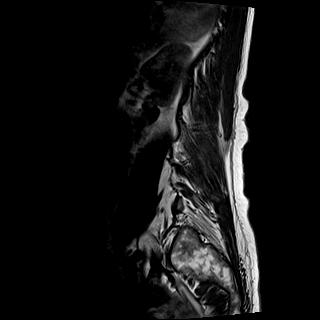
[im 5/17]
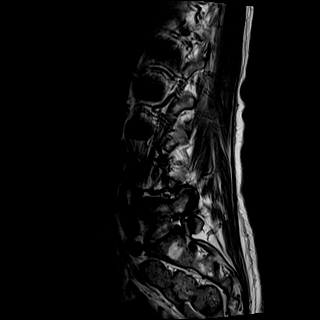
[im 9/17]
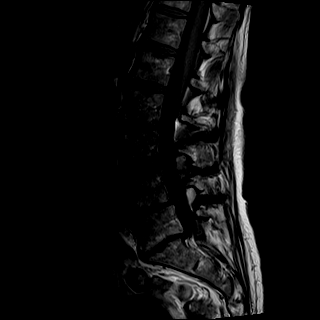
[im 13/17]
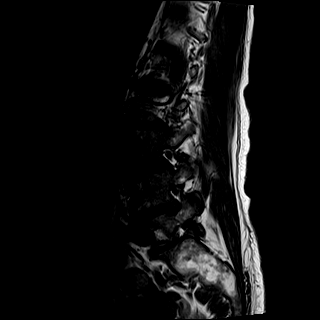
[im 17/17]
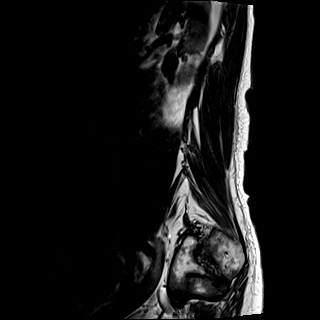

[Series 8: T2 · axial · 4.0mm · 0.78mm/px · z∈[-145,+49]mm · 9 of 33 slices shown (2 of 2)]
[im 1/33]
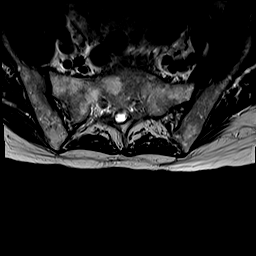
[im 5/33]
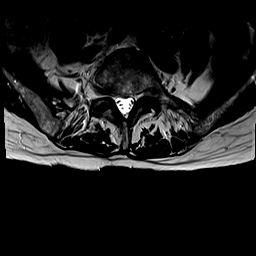
[im 9/33]
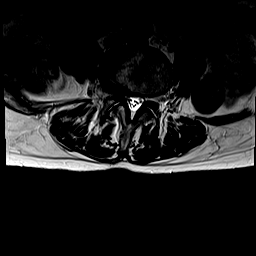
[im 13/33]
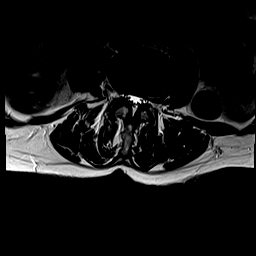
[im 17/33]
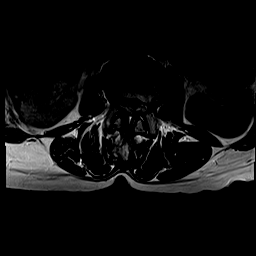
[im 21/33]
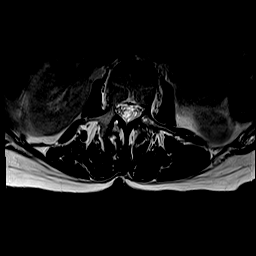
[im 25/33]
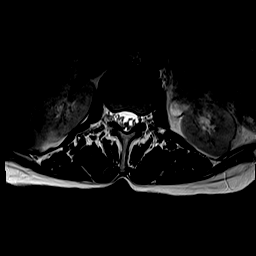
[im 29/33]
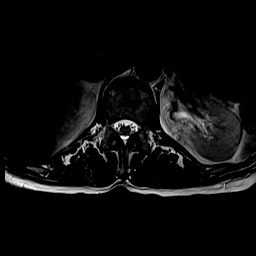
[im 33/33]
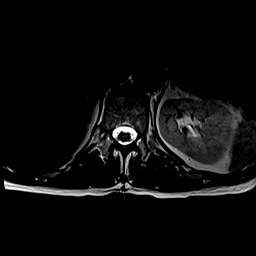

[Series 10: T1 · axial · 4.0mm · 0.39mm/px · z∈[-145,+49]mm · 9 of 33 slices shown (2 of 2)]
[im 1/33]
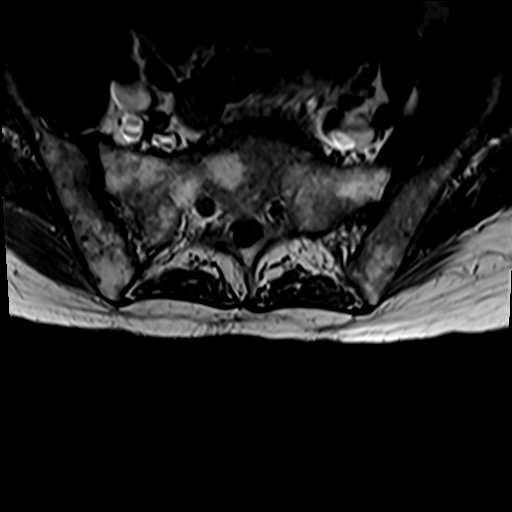
[im 5/33]
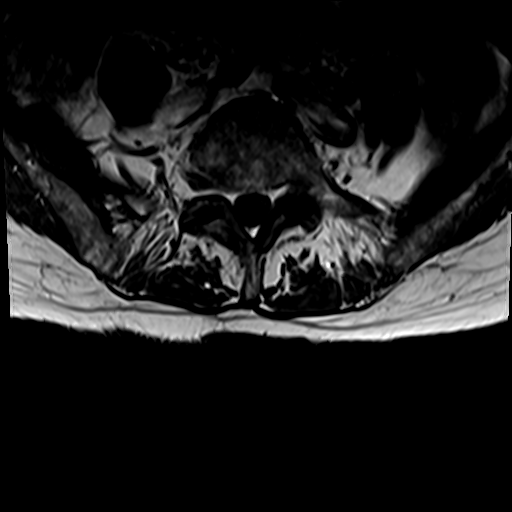
[im 9/33]
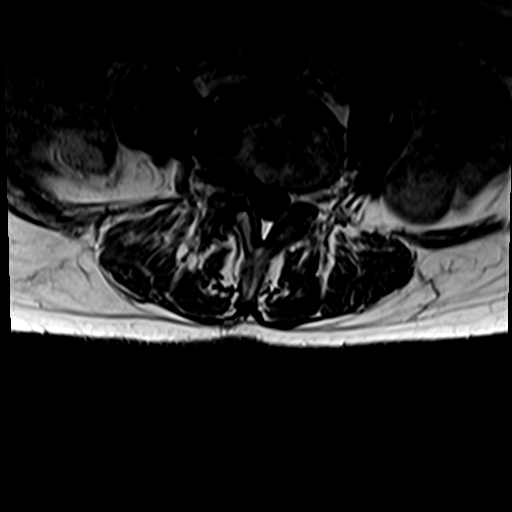
[im 13/33]
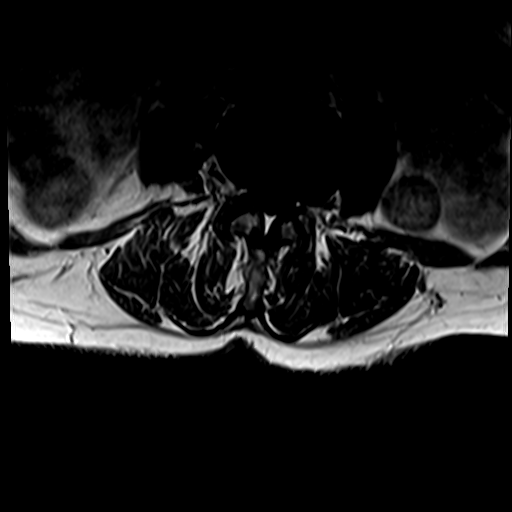
[im 17/33]
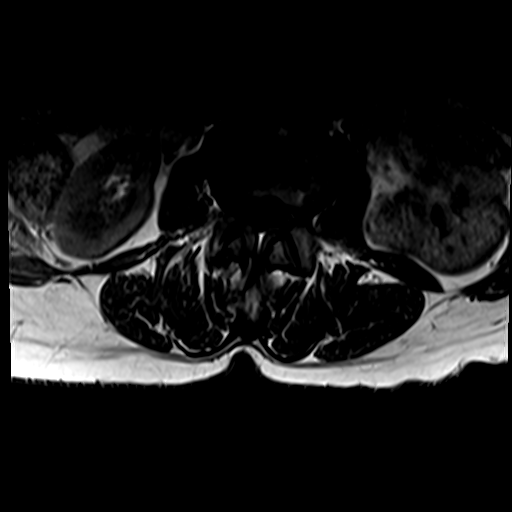
[im 21/33]
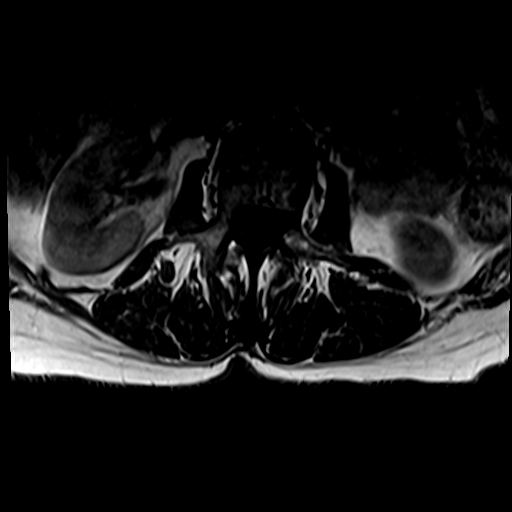
[im 25/33]
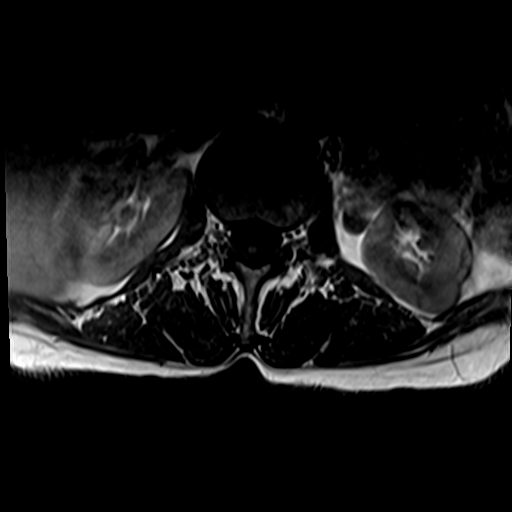
[im 29/33]
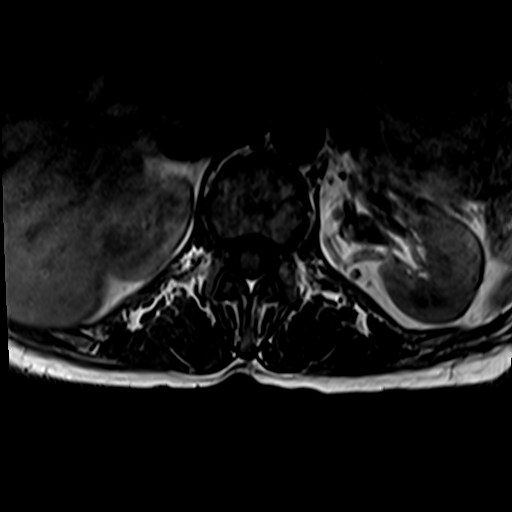
[im 33/33]
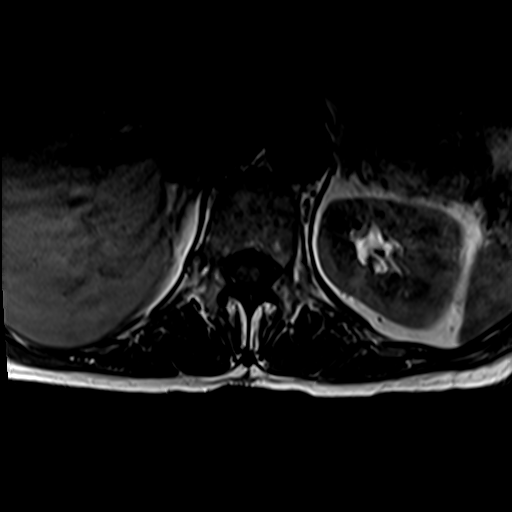

[Series 11: T1 fat-sat post-contrast · sagittal · 4.0mm · 0.81mm/px · 5 of 17 slices shown]
[im 1/17]
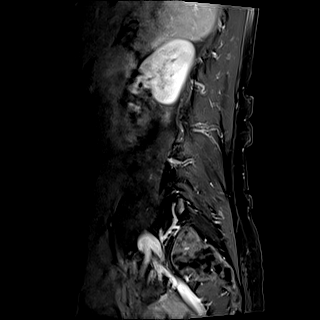
[im 5/17]
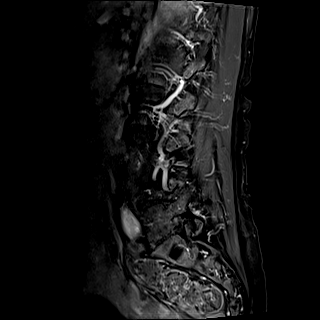
[im 9/17]
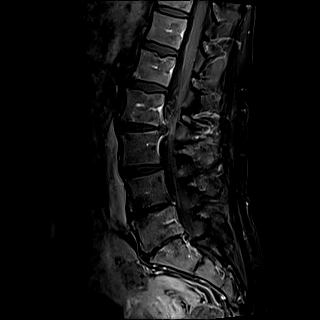
[im 13/17]
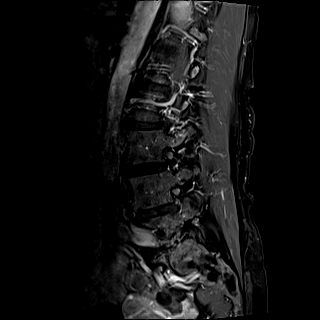
[im 17/17]
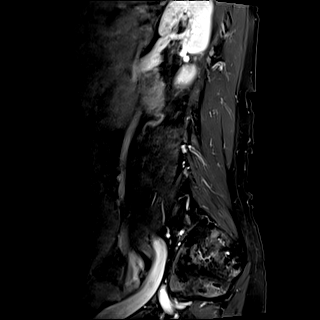

[34 of 48 positions shown; findings below may reference images not displayed]

FINDINGS: Segmentation:  Standard.

Alignment:  Stable.

Vertebrae: Marrow signal is mildly heterogeneous similar to prior
study. There is minor degenerative endplate marrow edema primarily
at L4-L5 and L5-S1. No focal suspicious osseous lesion. A hemangioma
is present at S1.

Conus medullaris and cauda equina: Conus extends to the L1-L2 level.
Conus appears normal. There is some redundancy of the cauda equina
in the region of canal stenosis.

Paraspinal and other soft tissues: Unremarkable.

Disc levels:

L1-L2:  Mild facet arthropathy.  No canal or foraminal stenosis.

L2-L3: Disc bulge with superimposed large central protrusion. Facet
arthropathy with ligamentum flavum infolding. Severe canal stenosis
with crowding of cauda equina. Effacement of subarticular recesses.
No right foraminal stenosis. Mild left foraminal stenosis.

L3-L4: Disc bulge slightly eccentric to the left with left foraminal
annular fissure. Facet arthropathy with ligamentum flavum infolding.
Mild canal stenosis. No foraminal stenosis.

L4-L5: Disc bulge eccentric to the right. Facet arthropathy with
ligamentum flavum infolding. Moderate canal stenosis. Narrowing of
subarticular recesses. Moderate to marked right foraminal stenosis.
Mild left foraminal stenosis.

L5-S1: Disc bulge. Facet arthropathy. Minor canal stenosis.
Narrowing of the right greater than left subarticular recesses. Mild
right and moderate left foraminal stenosis.
IMPRESSION: Multilevel degenerative changes as detailed above. Most notably,
there is severe canal stenosis at L2-L3 with crowding of the cauda
equina. Subarticular recess narrowing is present at L2-L3, L4-L5,
and L5-S1. Foraminal narrowing is greatest on the right at L4-L5.

## 2022-11-19 DIAGNOSIS — Z961 Presence of intraocular lens: Secondary | ICD-10-CM | POA: Diagnosis not present

## 2022-11-19 DIAGNOSIS — H35372 Puckering of macula, left eye: Secondary | ICD-10-CM | POA: Diagnosis not present

## 2022-11-28 DIAGNOSIS — M25532 Pain in left wrist: Secondary | ICD-10-CM | POA: Diagnosis not present

## 2022-11-28 DIAGNOSIS — I700 Atherosclerosis of aorta: Secondary | ICD-10-CM | POA: Diagnosis not present

## 2022-11-28 DIAGNOSIS — E7800 Pure hypercholesterolemia, unspecified: Secondary | ICD-10-CM | POA: Diagnosis not present

## 2022-11-28 DIAGNOSIS — M5137 Other intervertebral disc degeneration, lumbosacral region: Secondary | ICD-10-CM | POA: Diagnosis not present

## 2022-12-18 DIAGNOSIS — M720 Palmar fascial fibromatosis [Dupuytren]: Secondary | ICD-10-CM | POA: Diagnosis not present

## 2022-12-18 DIAGNOSIS — M79641 Pain in right hand: Secondary | ICD-10-CM | POA: Diagnosis not present

## 2022-12-18 DIAGNOSIS — M19032 Primary osteoarthritis, left wrist: Secondary | ICD-10-CM | POA: Insufficient documentation

## 2022-12-18 DIAGNOSIS — Z4789 Encounter for other orthopedic aftercare: Secondary | ICD-10-CM | POA: Diagnosis not present

## 2022-12-18 DIAGNOSIS — B351 Tinea unguium: Secondary | ICD-10-CM | POA: Diagnosis not present

## 2022-12-18 DIAGNOSIS — M13832 Other specified arthritis, left wrist: Secondary | ICD-10-CM | POA: Diagnosis not present

## 2023-01-02 ENCOUNTER — Encounter: Payer: Self-pay | Admitting: Podiatry

## 2023-01-02 ENCOUNTER — Ambulatory Visit (INDEPENDENT_AMBULATORY_CARE_PROVIDER_SITE_OTHER): Payer: Medicare Other | Admitting: Podiatry

## 2023-01-02 DIAGNOSIS — Z9889 Other specified postprocedural states: Secondary | ICD-10-CM

## 2023-01-02 DIAGNOSIS — M7989 Other specified soft tissue disorders: Secondary | ICD-10-CM

## 2023-01-02 DIAGNOSIS — M7752 Other enthesopathy of left foot: Secondary | ICD-10-CM | POA: Diagnosis not present

## 2023-01-02 MED ORDER — DEXAMETHASONE SODIUM PHOSPHATE 120 MG/30ML IJ SOLN
2.0000 mg | Freq: Once | INTRAMUSCULAR | Status: AC
  Administered 2023-01-02: 2 mg via INTRA_ARTICULAR

## 2023-01-02 NOTE — Progress Notes (Signed)
She presents today chief concern of painful area beneath the fifth metatarsal of the left foot.  She is also concerned about scar from the previous surgery on the right foot.  That surgery was dated August 23, 2022.  Objective: Vital signs are stable alert oriented x 3.  Pulses are palpable.  She has pain on palpation of the fifth metatarsal head area of the right foot most likely secondary to scar tissue which appears to be adhesed to the underlying bony tissue.  Contralateral foot demonstrates soft tissue mass plantar aspect of the fifth metatarsal head left is not warm to the touch but does appear to be fluctuant.  Assessment: Scarred down surgical site fifth right.  With neuritis.  Left foot demonstrates a bursitis subfifth met head.  Plan: Discussed etiology pathology conservative versus surgical therapies I injected 10 mg Kenalog 5 mg Marcaine to the point of maximal tenderness inflating the bursa plantar left.  She tolerated procedure well will follow-up with me on an as-needed basis.

## 2023-02-12 DIAGNOSIS — Z4789 Encounter for other orthopedic aftercare: Secondary | ICD-10-CM | POA: Diagnosis not present

## 2023-02-12 DIAGNOSIS — M720 Palmar fascial fibromatosis [Dupuytren]: Secondary | ICD-10-CM | POA: Diagnosis not present

## 2023-02-12 DIAGNOSIS — B351 Tinea unguium: Secondary | ICD-10-CM | POA: Diagnosis not present

## 2023-02-12 DIAGNOSIS — M1812 Unilateral primary osteoarthritis of first carpometacarpal joint, left hand: Secondary | ICD-10-CM | POA: Diagnosis not present

## 2023-02-12 DIAGNOSIS — M13832 Other specified arthritis, left wrist: Secondary | ICD-10-CM | POA: Diagnosis not present

## 2023-03-14 DIAGNOSIS — I700 Atherosclerosis of aorta: Secondary | ICD-10-CM | POA: Diagnosis not present

## 2023-03-14 DIAGNOSIS — M48061 Spinal stenosis, lumbar region without neurogenic claudication: Secondary | ICD-10-CM | POA: Diagnosis not present

## 2023-03-14 DIAGNOSIS — Z0000 Encounter for general adult medical examination without abnormal findings: Secondary | ICD-10-CM | POA: Diagnosis not present

## 2023-03-31 DIAGNOSIS — L578 Other skin changes due to chronic exposure to nonionizing radiation: Secondary | ICD-10-CM | POA: Diagnosis not present

## 2023-03-31 DIAGNOSIS — L2489 Irritant contact dermatitis due to other agents: Secondary | ICD-10-CM | POA: Diagnosis not present

## 2023-03-31 DIAGNOSIS — E041 Nontoxic single thyroid nodule: Secondary | ICD-10-CM | POA: Diagnosis not present

## 2023-03-31 DIAGNOSIS — L821 Other seborrheic keratosis: Secondary | ICD-10-CM | POA: Diagnosis not present

## 2023-03-31 DIAGNOSIS — L570 Actinic keratosis: Secondary | ICD-10-CM | POA: Diagnosis not present

## 2023-03-31 DIAGNOSIS — D1801 Hemangioma of skin and subcutaneous tissue: Secondary | ICD-10-CM | POA: Diagnosis not present

## 2023-03-31 DIAGNOSIS — L814 Other melanin hyperpigmentation: Secondary | ICD-10-CM | POA: Diagnosis not present

## 2023-04-01 ENCOUNTER — Other Ambulatory Visit (HOSPITAL_COMMUNITY): Payer: Medicare Other

## 2023-04-01 ENCOUNTER — Ambulatory Visit
Admission: RE | Admit: 2023-04-01 | Discharge: 2023-04-01 | Disposition: A | Discharge status: Home / Self Care | Payer: Medicare Other | Source: Ambulatory Visit | Attending: Otolaryngology | Admitting: Otolaryngology

## 2023-04-01 DIAGNOSIS — E041 Nontoxic single thyroid nodule: Secondary | ICD-10-CM | POA: Insufficient documentation

## 2023-06-16 DIAGNOSIS — Z4789 Encounter for other orthopedic aftercare: Secondary | ICD-10-CM | POA: Diagnosis not present

## 2023-06-16 DIAGNOSIS — M720 Palmar fascial fibromatosis [Dupuytren]: Secondary | ICD-10-CM | POA: Diagnosis not present

## 2023-06-16 DIAGNOSIS — M1812 Unilateral primary osteoarthritis of first carpometacarpal joint, left hand: Secondary | ICD-10-CM | POA: Diagnosis not present

## 2023-06-20 ENCOUNTER — Other Ambulatory Visit: Payer: Self-pay | Admitting: Obstetrics and Gynecology

## 2023-06-20 DIAGNOSIS — Z79890 Hormone replacement therapy (postmenopausal): Secondary | ICD-10-CM

## 2023-07-03 DIAGNOSIS — L570 Actinic keratosis: Secondary | ICD-10-CM | POA: Diagnosis not present

## 2023-07-03 DIAGNOSIS — L608 Other nail disorders: Secondary | ICD-10-CM | POA: Diagnosis not present

## 2023-08-11 ENCOUNTER — Encounter: Payer: Self-pay | Admitting: Nurse Practitioner

## 2023-08-11 ENCOUNTER — Ambulatory Visit (INDEPENDENT_AMBULATORY_CARE_PROVIDER_SITE_OTHER): Payer: Medicare Other | Admitting: Nurse Practitioner

## 2023-08-11 VITALS — BP 96/64 | HR 82 | Ht 63.0 in | Wt 114.4 lb

## 2023-08-11 DIAGNOSIS — R222 Localized swelling, mass and lump, trunk: Secondary | ICD-10-CM

## 2023-08-11 DIAGNOSIS — K2970 Gastritis, unspecified, without bleeding: Secondary | ICD-10-CM

## 2023-08-11 DIAGNOSIS — Z860102 Personal history of hyperplastic colon polyps: Secondary | ICD-10-CM

## 2023-08-11 DIAGNOSIS — R1013 Epigastric pain: Secondary | ICD-10-CM

## 2023-08-11 NOTE — Progress Notes (Signed)
Alexis Medina, I called Alexis Medina and informed her that Dr. Adela Lank was in agreement to go ahead and send a referral to general surgery to further evaluate/biopsy lower sternum subcutaneous mass. Please enter a general surgery referral.  Patient was instructed to contact our office if she has not received a call from general surgery within 1 to 2 weeks.  Alexis Medina, please fax a copy of today's office visit with this addendum to the PCP so he knows we are referring patient to general surgery.  Thanks

## 2023-08-11 NOTE — Patient Instructions (Signed)
You have been scheduled for an endoscopy. Please follow written instructions given to you at your visit today.  If you use inhalers (even only as needed), please bring them with you on the day of your procedure.  If you take any of the following medications, they will need to be adjusted prior to your procedure:   DO NOT TAKE 7 DAYS PRIOR TO TEST- Trulicity (dulaglutide) Ozempic, Wegovy (semaglutide) Mounjaro (tirzepatide) Bydureon Bcise (exanatide extended release)  DO NOT TAKE 1 DAY PRIOR TO YOUR TEST Rybelsus (semaglutide) Adlyxin (lixisenatide) Victoza (liraglutide) Byetta (exanatide) _________________________________________________________________________  Please follow up with your PCP regarding chest wall nodule.  Thank you for entrusting me with your care and for choosing [facility], [place], [place]

## 2023-08-11 NOTE — Progress Notes (Signed)
08/11/2023 BERTILLA SHATTLES [id number] 26-Aug-1946   Chief Complaint: Chest wall tenderness   History of Present Illness: Alexis Medina. Scheib is a 77 year old female with a past medical history of arthritis, osteopenia/osteoporosis, hyperlipidemia, coronary artery disease per CT, multinodular goiter, H. Pylori and GERD. She is known by Dr. Adela Lank. She was previously referred to Dr. Adela Lank by her PCP due to having a lump to her chest x 6 months, thought to most likely be a benign lipoma.  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 pylori immunohistochemistry was inconclusive.  H. pylori stool antigen 11/28/2020 was positive and she was treated with a course of Amoxicillin/Clarithromycin/Flagyl and Omeprazole x 14 days.  Post treatment H. pylori stool antigen test was negative.  She underwent a chest CT to further evaluate the suspected lipoma to the sternum 11/23/2020 did not identify any mass or soft tissue density to the sternum but identified evidence of the vessel coronary artery calcifications. She was evaluated by cardiologist Dr. Cristal Deer End 03/14/2021 and she underwent an exercise pharmacologic myocardial perfusion stress test which was normal.   She presents today for further evaluation regarding a lipoma to her lower sternum which has grown in size since she was last seen by Dr. Adela Lank 03/15/2021.  The subcutaneous lipoma/nodule to her lower sternum is somewhat tender as well is unrelated to eating.  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  She also describes as separate pain that runs down the esophagus to the epigastric area which occurs spontaneously and often when she is sitting  at home and last for 2 to 3 minutes. These episodes occur sporadically and might occur 1 or 2 days then goes away for several weeks.  She describes having 4-5 episodes within the past 2 months.  No heartburn or abdominal pain.  She is passing normal formed brown bowel movement daily.  No nausea or vomiting.  Her appetite is good.  She remains active.  Weight has been stable.  Her most recent colonoscopy was 06/10/2013 which identified one 6 mm hyperplastic polyp removed from the rectum.  Dr. Adela Lank previously verified no colon polyp surveillance colonoscopies recommended due to age.      Latest Ref Rng & Units 02/19/2021    9:41 AM 04/04/2020   10:14 AM 04/01/2019    3:34 PM  CBC  WBC 3.4 - 10.8 x10E3/uL 6.1  6.0  8.7   Hemoglobin 11.1 - 15.9 g/dL 47.8  29.5  62.1   Hematocrit 34.0 - 46.6 % 40.2  38.6  35.7   Platelets 150 - 450 x10E3/uL 258  232  220        Latest Ref Rng & Units 08/23/2021   11:06 AM 02/19/2021    9:41 AM 11/23/2020    3:34 PM  CMP  Glucose 70 - 99 mg/dL 79  77    BUN 8 - 27 mg/dL 11  12    Creatinine 3.08 - 1.00 mg/dL 6.57  8.46  9.62   Sodium 134 - 144 mmol/L 140  138    Potassium 3.5 - 5.2 mmol/L 4.9  4.6    Chloride 96 - 106 mmol/L 101  100    CO2 20 - 29 mmol/L  25  25    Calcium 8.7 - 10.3 mg/dL 84.1  32.4    Total Protein 6.0 - 8.5 g/dL 7.6  7.5    Total Bilirubin 0.0 - 1.2 mg/dL 0.5  0.6    Alkaline Phos 44 - 121 IU/L 51  51    AST 0 - 40 IU/L 21  22    ALT 0 - 32 IU/L 12  12      EGD 11/01/2020 by Dr. Adela Lank: - Esophagogastric landmarks identified.  - Normal esophagus otherwise  - empiric dilation performed to 17mm as above.  - Normal stomach. Biopsied to rule out H pylori.  - Normal duodenal bulb and second portion of the duodenum.  Surgical [P], gastric antrum and gastric body - MILD CHRONIC GASTRITIS WITHOUT ACTIVITY - NO INTESTINAL METAPLASIA IDENTIFIED - SEE COMMENT Microscopic Comment The biopsy shows chronic gastritis with a dense  lymphoplasmacytic infiltrate. By immunohistochemistry, the lymphoplasmacytic infiltrate is composed of an admixture of B and T cells (CD20, CD3) without aberrant expression of CD10, CD5 or cyclin-D1. The plasma cell population is polytypic by kappa and lambda in situ hybridization. H. pylori immunohistochemistry is inconclusive; there is dot-like positivity which is more commonly seen and treated infection.  Colonoscopy 06/10/2013: One 6 mm hyperplastic polyp removed from the rectum Nonbleeding internal hemorrhoids Diverticulosis in the sigmoid colon - 10 Year recall colonoscopy - HYPERPLASTIC POLYP.  - NEGATIVE FOR DYSPLASIA AND MALIGNANCY.   IMAGE STUDIES:   Thyroid ultrasound 04/01/2023: 1. Multinodular thyroid gland.  No new or enlarging thyroid nodules. 2. Similar appearance of previously biopsied nodule labeled 2 in the right thyroid lobe. Correlate with biopsy results. 3. Additional small and/or benign-appearing nodules do not meet criteria for further dedicated follow-up or biopsy.  NM exercise stress test 03/29/2021: Blood pressure demonstrated a hypertensive response to exercise. Horizontal ST segment depression ST segment depression of 1 mm was noted during stress in the II, III, aVF, V4, V5 and V6 leads. The study is normal. This is a low risk study. The left ventricular ejection fraction is normal (55-65%). CT attenuation images show evidence of aortic and coronary calcifications. Exercise duration of 7 minutes with hypertensive response to exercise and positive EKG changes. Suspect likely false positive given normal perfusion.   Chest CT 11/23/2020: FINDINGS: Cardiovascular: Scattered aortic atherosclerosis. Normal heart size. Three-vessel coronary artery calcifications. No pericardial effusion.   Mediastinum/Nodes: No enlarged mediastinal, hilar, or axillary lymph nodes. Thyroid gland, trachea, and esophagus demonstrate no significant findings.   Lungs/Pleura:  Partially calcified, definitively benign subpleural nodule of the posterior right upper lobe (series 5, image 42). No pleural effusion or pneumothorax.   Upper Abdomen: No acute abnormality.   Musculoskeletal: No chest wall mass or suspicious bone lesions identified.   IMPRESSION: 1. No mass or other abnormality of the sternum or overlying anterior soft tissues. 2. Coronary artery disease.  Aortic Atherosclerosis   Past Medical History:  Diagnosis Date   Arthritis    Cataract    Concussion    GERD (gastroesophageal reflux disease)    History of Helicobacter pylori infection    Hormone replacement therapy    Hyperlipidemia    Osteopenia    Osteoporosis    Past Surgical History:  Procedure Laterality Date   LUMBAR DISC SURGERY     TONSILLECTOMY     TUBAL LIGATION     UPPER GI ENDOSCOPY     Current Outpatient Medications on File Prior to Visit  Medication Sig Dispense Refill   alendronate (  FOSAMAX) 70 MG tablet Take 70 mg by mouth once a week. Take with a full glass of water on an empty stomach.     cholecalciferol (VITAMIN D) 1000 UNITS tablet Take 1,000 Units by mouth daily.     cyclobenzaprine (FLEXERIL) 5 MG tablet Take 5 mg by mouth at bedtime as needed.     Docosahexaenoic Acid (DHA OMEGA 3) 100 MG CAPS TAKE 2 EACH BY MOUTH DAILY. 60 capsule 12   famotidine (PEPCID) 20 MG tablet TAKE 1 TABLET TWICE A DAY (PLEASE CALL [phone] TO SCHEDULE AN OFFICE VISIT FOR FURTHER REFILLS) 60 tablet 0   ibuprofen (ADVIL,MOTRIN) 800 MG tablet Take 1 tablet (800 mg total) by mouth every 8 (eight) hours as needed. 90 tablet 3   Multiple Vitamin (MULTIVITAMIN) capsule Take 1 capsule by mouth daily.     PREMPRO 0.3-1.5 MG tablet TAKE 1 TABLET DAILY 84 tablet 3   RESTASIS 0.05 % ophthalmic emulsion Place 1 drop into both eyes 2 (two) times daily.     tazarotene (AVAGE) 0.1 % cream Apply topically at bedtime. For acne     terbinafine (LAMISIL) 250 MG tablet Take 1 tablet every day by  oral route.     traMADol (ULTRAM) 50 MG tablet Take 1 tablet (50 mg total) by mouth every 6 (six) hours as needed. 200 tablet 1   No current facility-administered medications on file prior to visit.   No Known Allergies  Current Medications, Allergies, Past Medical History, Past Surgical History, Family History and Social History were reviewed in [employer] record.  Review of Systems:   Constitutional: Negative for fever, sweats, chills or weight loss.  Respiratory: Negative for shortness of breath.   Cardiovascular:  Gastrointestinal: See HPI.  Musculoskeletal: Negative for back pain or muscle aches.  Neurological: Negative for dizziness, headaches or paresthesias.   Physical Exam: BP 96/64 (BP Location: Left Arm, Patient Position: Sitting, Cuff Size: Normal)   Pulse 82   Ht 5\' 3"  (1.6 m)   Wt 114 lb 6 oz (51.9 kg)   SpO2 99%   BMI 20.26 kg/m   General: 77 year old female in no acute distress. Head: Normocephalic and atraumatic. Eyes: No scleral icterus. Conjunctiva pink . Ears: Normal auditory acuity. Neck: Right thyroid nodule.  Mouth: Dentition intact. No ulcers or lesions.  Lungs: Clear throughout to auscultation. Chest: 3 cm semi soft/semi firm subcutaneous nodule to the sternum approximately 2 cm above the xiphoid process mildly tender. Heart: Regular rate and rhythm, no murmur. Abdomen: Soft, nontender and nondistended. No masses or hepatomegaly. Normal bowel sounds x 4 quadrants.  Rectal: Deferred.  Musculoskeletal: Symmetrical with no gross deformities. Extremities: No edema. Neurological: Alert oriented x 4. No focal deficits.  Psychological: Alert and cooperative. Normal mood and affect  Assessment and Recommendations:  77 year old female with a history of GERD, H. 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  Patient with intermittent episodes of esophageal pain which radiates to the epigastric area which  occurs spontaneously and not associated with eating. -EGD to rule out autoimmune gastritis  benefits and risks discussed including risk with sedation, risk of bleeding, perforation and infection   Subcutaneous ? lipoma/nodule/mass to the distal sternum.  Chest CT 11/2020 did not identify a chest wall mass or suspicious bony lesion. -I advised the patient to follow-up with her PCP regarding her subcutaneous chest wall lesion as it is unlikely GI etiology, however, I will check with Dr. Adela Lank if he wishes to pursue  further evaluation at this juncture ie:  direct ultrasound over the chest wall lesion versus referral to general surgery for biopsy  History of a hyperplastic rectal polyp per colonoscopy 05/2013 -No further colon polyp surveillance colonoscopies warranted due to age  Coronary artery disease per CT. Normal NM exercise stress test 03/2021.

## 2023-08-11 NOTE — Progress Notes (Signed)
Agree with assessment and plan as outlined.  Can proceed with EGD.  If the chest wall lesion is bothering her and enlarging I would refer her to general surgery and they can discuss how they wish to approach this. Thanks

## 2023-08-12 ENCOUNTER — Telehealth: Payer: Self-pay

## 2023-08-12 NOTE — Telephone Encounter (Signed)
Referral sent to [facility] along with pt records. Recent office notes faxed to Dr. Sullivan Lone [phone]

## 2023-08-12 NOTE — Telephone Encounter (Signed)
Message Received: Percell Locus, Malachi Carl, NP  Emeline Darling, RN      Previous Messages  Routed Note  Author: Arnaldo Natal, NP Service: Gastroenterology Author Type: Nurse Practitioner  Filed: 08/11/2023  4:26 PM Encounter Date: 08/11/2023 Status: Signed  Editor: Arnaldo Natal, NP (Nurse Practitioner)   Viviann Spare, I called Mrs. Scherr and informed her that Dr. Adela Lank was in agreement to go ahead and send a referral to general surgery to further evaluate/biopsy lower sternum subcutaneous mass. Please enter a general surgery referral.  Patient was instructed to contact our office if she has not received a call from general surgery within 1 to 2 weeks.   Viviann Spare, please fax a copy of today's office visit with this addendum to the PCP so he knows we are referring patient to general surgery.  Thanks

## 2023-08-22 ENCOUNTER — Ambulatory Visit: Payer: Self-pay | Admitting: General Surgery

## 2023-08-22 DIAGNOSIS — D171 Benign lipomatous neoplasm of skin and subcutaneous tissue of trunk: Secondary | ICD-10-CM | POA: Diagnosis not present

## 2023-08-27 ENCOUNTER — Telehealth: Payer: Self-pay | Admitting: Gastroenterology

## 2023-08-27 NOTE — Telephone Encounter (Signed)
Inbound call from patient stating that she is scheduled to have an EGD with Dr. Adela Lank on 111/14 and on 11/18 she is supposed to have another procedure to remove a fatty mass. Requesting a call to discuss if having the procedure that close together. Please advise.

## 2023-08-28 NOTE — Telephone Encounter (Signed)
Okay to proceed with EGD on 11/14. Her surgery is 11/18, should not be any issues. Thanks

## 2023-08-28 NOTE — Telephone Encounter (Signed)
Patient made aware of NP/MD recommendations.

## 2023-08-28 NOTE — Telephone Encounter (Signed)
Dr. Adela Lank, pls review message below and review last office visit.  Please verify if you  still want the patient to proceed with an EGD as scheduled 11/14 with lower chest lipoma/fatty mass surgery scheduled on 11/18.  Thank you

## 2023-08-31 ENCOUNTER — Encounter: Payer: Self-pay | Admitting: Certified Registered Nurse Anesthetist

## 2023-09-01 DIAGNOSIS — L601 Onycholysis: Secondary | ICD-10-CM | POA: Diagnosis not present

## 2023-09-02 ENCOUNTER — Encounter (HOSPITAL_BASED_OUTPATIENT_CLINIC_OR_DEPARTMENT_OTHER): Payer: Self-pay | Admitting: General Surgery

## 2023-09-02 ENCOUNTER — Other Ambulatory Visit: Payer: Self-pay

## 2023-09-02 NOTE — Progress Notes (Signed)
Spoke w/ via phone for pre-op interview---pt Lab needs dos----  none       Lab results------ COVID test -----patient states asymptomatic no test needed Arrive at -------1030 09-08-2023 NPO after MN NO Solid Food.  Clear liquids from MN until---930 Med rec completed Medications to take morning of surgery -----famotidine, restasis eye drop, prempro Diabetic medication -----n/a Patient instructed no nail polish to be worn day of surgery Patient instructed to bring photo id and insurance card day of surgery Patient aware to have Driver (ride ) / caregiver   husband Alexis Medina  for 24 hours after surgery -  Patient Special Instructions -----none Pre-Op special Instructions -----none Patient verbalized understanding of instructions that were given at this phone interview. Patient denies chest pain, sob, fever, cough at the interview.

## 2023-09-04 ENCOUNTER — Ambulatory Visit: Payer: Medicare Other | Admitting: Gastroenterology

## 2023-09-04 ENCOUNTER — Encounter: Payer: Self-pay | Admitting: Gastroenterology

## 2023-09-04 VITALS — BP 120/79 | HR 65 | Temp 97.5°F | Resp 15 | Ht 63.0 in | Wt 114.0 lb

## 2023-09-04 DIAGNOSIS — K2950 Unspecified chronic gastritis without bleeding: Secondary | ICD-10-CM

## 2023-09-04 DIAGNOSIS — K2970 Gastritis, unspecified, without bleeding: Secondary | ICD-10-CM | POA: Diagnosis not present

## 2023-09-04 DIAGNOSIS — E785 Hyperlipidemia, unspecified: Secondary | ICD-10-CM | POA: Diagnosis not present

## 2023-09-04 DIAGNOSIS — R1013 Epigastric pain: Secondary | ICD-10-CM | POA: Diagnosis not present

## 2023-09-04 MED ORDER — SODIUM CHLORIDE 0.9 % IV SOLN: 500.0000 mL | Freq: Once | INTRAVENOUS | Status: DC

## 2023-09-04 MED ORDER — OMEPRAZOLE 20 MG PO CPDR: 20.0000 mg | DELAYED_RELEASE_CAPSULE | Freq: Every day | ORAL | 1 refills | Status: DC

## 2023-09-04 NOTE — Progress Notes (Signed)
1124 Robinul 0.1 mg IV given due large amount of secretions upon assessment.  MD made aware, vss

## 2023-09-04 NOTE — Progress Notes (Signed)
[facility] History and Physical   Primary Care Physician:  Bosie Clos, MD   Reason for Procedure:   Epigastric pain, gastritis  Plan:    EGD     HPI: Alexis Medina is a 77 y.o. female  here for EGD to evaluate complaints of intermittent epigastric pain. Prior EGD 2022 showed ? Autoimmune gastritis and she had a history of H pylori that was treated and eradicated. EGD to further evaluate recurrent symptoms. Using pepcid BID has helped but not resolved her symptoms.    Otherwise feels well without any cardiopulmonary symptoms.   I have discussed risks / benefits of anesthesia and endoscopic procedure with Alexis Medina and they wish to proceed with the exams as outlined today.    Past Medical History:  Diagnosis Date   Arthritis    Cataract    Concussion 2020   GERD (gastroesophageal reflux disease)    History of Helicobacter pylori infection    Hormone replacement therapy    Hyperlipidemia    Osteopenia    Osteoporosis    PONV (postoperative nausea and vomiting)     Past Surgical History:  Procedure Laterality Date   EYE SURGERY     cararacts both eyes   LUMBAR DISC SURGERY     TONSILLECTOMY     TUBAL LIGATION     UPPER GI ENDOSCOPY      Prior to Admission medications   Medication Sig Start Date End Date Taking? Authorizing Provider  Calcium Carb-Cholecalciferol (CALCIUM 600+D3) 600-20 MG-MCG TABS    Yes [provider]  cholecalciferol (VITAMIN D) 1000 UNITS tablet Take 1,000 Units by mouth daily.   Yes [provider]  clotrimazole-betamethasone (LOTRISONE) cream APPLY TO AFFECTED AREA OF NAILS TWICE DAILY 09/01/23  Yes [provider]  cyclobenzaprine (FLEXERIL) 5 MG tablet Take 5 mg by mouth at bedtime as needed. 09/02/22  Yes [provider]  Docosahexaenoic Acid (DHA OMEGA 3) 100 MG CAPS TAKE 2 EACH BY MOUTH DAILY. 07/01/18  Yes Bosie Clos, MD  famotidine (PEPCID) 20 MG tablet TAKE 1 TABLET  TWICE A DAY (PLEASE CALL [phone] TO SCHEDULE AN OFFICE VISIT FOR FURTHER REFILLS) 07/01/22  Yes Chiann Goffredo, Willaim Rayas, MD  Multiple Vitamin (MULTIVITAMIN) capsule Take 1 capsule by mouth daily.   Yes [provider]  PREMPRO 0.3-1.5 MG tablet TAKE 1 TABLET DAILY 06/20/23  Yes Hildred Laser, MD  RESTASIS 0.05 % ophthalmic emulsion Place 1 drop into both eyes 2 (two) times daily. 10/18/20  Yes [provider]  tazarotene (AVAGE) 0.1 % cream Apply topically at bedtime. For acne 03/08/16  Yes [provider]  traMADol (ULTRAM) 50 MG tablet Take 1 tablet (50 mg total) by mouth every 6 (six) hours as needed. Patient taking differently: Take 50 mg by mouth 2 (two) times daily. 06/06/22  Yes Bosie Clos, MD  alendronate (FOSAMAX) 70 MG tablet Take 70 mg by mouth once a week. Take with a full glass of water on an empty stomach.    [provider]    Current Outpatient Medications  Medication Sig Dispense Refill   Calcium Carb-Cholecalciferol (CALCIUM 600+D3) 600-20 MG-MCG TABS      cholecalciferol (VITAMIN D) 1000 UNITS tablet Take 1,000 Units by mouth daily.     clotrimazole-betamethasone (LOTRISONE) cream APPLY TO AFFECTED AREA OF NAILS TWICE DAILY     cyclobenzaprine (FLEXERIL) 5 MG tablet Take 5 mg by mouth at bedtime as needed.     Docosahexaenoic Acid (DHA  OMEGA 3) 100 MG CAPS TAKE 2 EACH BY MOUTH DAILY. 60 capsule 12   famotidine (PEPCID) 20 MG tablet TAKE 1 TABLET TWICE A DAY (PLEASE CALL [phone] TO SCHEDULE AN OFFICE VISIT FOR FURTHER REFILLS) 60 tablet 0   Multiple Vitamin (MULTIVITAMIN) capsule Take 1 capsule by mouth daily.     PREMPRO 0.3-1.5 MG tablet TAKE 1 TABLET DAILY 84 tablet 3   RESTASIS 0.05 % ophthalmic emulsion Place 1 drop into both eyes 2 (two) times daily.     tazarotene (AVAGE) 0.1 % cream Apply topically at bedtime. For acne     traMADol (ULTRAM) 50 MG tablet Take 1 tablet (50 mg total) by mouth every 6 (six) hours as needed.  (Patient taking differently: Take 50 mg by mouth 2 (two) times daily.) 200 tablet 1   alendronate (FOSAMAX) 70 MG tablet Take 70 mg by mouth once a week. Take with a full glass of water on an empty stomach.     Current Facility-Administered Medications  Medication Dose Route Frequency Provider Last Rate Last Admin   0.9 %  sodium chloride infusion  500 mL Intravenous Once [name], Willaim Rayas, MD        Allergies as of 09/04/2023   (No Known Allergies)    Family History  Problem Relation Age of Onset   Alzheimer's disease Mother    Heart failure Mother    Heart failure Father    Heart disease Brother    Hypertension Brother    Stroke Brother    Atrial fibrillation Brother    Healthy Son    Breast cancer Cousin    Colon cancer Neg Hx    Colon polyps Neg Hx    Esophageal cancer Neg Hx    Rectal cancer Neg Hx    Stomach cancer Neg Hx     Social History   Socioeconomic History   Marital status: Married    Spouse name: Not on file   Number of children: 1   Years of education: Not on file   Highest education level: Bachelor's degree (e.g., BA, AB, BS)  Occupational History   Occupation: Charity fundraiser @ [employer]    Comment: retired  Tobacco Use   Smoking status: Never   Smokeless tobacco: Never  Vaping Use   Vaping status: Never Used  Substance and Sexual Activity   Alcohol use: Not Currently    Comment: glass of wine once a year   Drug use: No   Sexual activity: Yes    Birth control/protection: None, Surgical  Other Topics Concern   Not on file  Social History Narrative   Not on file   Social Determinants of Health   Financial Resource Strain: Low Risk  (01/22/2021)   Overall Financial Resource Strain (CARDIA)    Difficulty of Paying Living Expenses: Not hard at all  Food Insecurity: No Food Insecurity (01/22/2021)   Hunger Vital Sign    Worried About Running Out of Food in the Last Year: Never true    Ran Out of Food in the Last Year: Never true  Transportation Needs:  No Transportation Needs (01/22/2021)   PRAPARE - Administrator, Civil Service (Medical): No    Lack of Transportation (Non-Medical): No  Physical Activity: Inactive (01/22/2021)   Exercise Vital Sign    Days of Exercise per Week: 0 days    Minutes of Exercise per Session: 0 min  Stress: No Stress Concern Present (01/22/2021)   [employer] of Occupational Health -  Occupational Stress Questionnaire    Feeling of Stress : Not at all  Social Connections: Socially Integrated (01/22/2021)   Social Connection and Isolation Panel [NHANES]    Frequency of Communication with Friends and Family: More than three times a week    Frequency of Social Gatherings with Friends and Family: Three times a week    Attends Religious Services: More than 4 times per year    Active Member of Clubs or Organizations: Yes    Attends Banker Meetings: More than 4 times per year    Marital Status: Married  Catering manager Violence: Not At Risk (01/22/2021)   Humiliation, Afraid, Rape, and Kick questionnaire    Fear of Current or Ex-Partner: No    Emotionally Abused: No    Physically Abused: No    Sexually Abused: No    Review of Systems: All other review of systems negative except as mentioned in the HPI.  Physical Exam: Vital signs BP 134/84   Pulse 73   Temp (!) 97.5 F (36.4 C)   Ht 5\' 3"  (1.6 m)   Wt 114 lb (51.7 kg)   SpO2 100%   BMI 20.19 kg/m   General:   Alert,  Well-developed, pleasant and cooperative in NAD Lungs:  Clear throughout to auscultation.   Heart:  Regular rate and rhythm Abdomen:  Soft, nontender and nondistended.   Neuro/Psych:  Alert and cooperative. Normal mood and affect. A and O x 3  Harlin Rain, MD [facility]

## 2023-09-04 NOTE — Progress Notes (Signed)
Called to room to assist during endoscopic procedure.  Patient ID and intended procedure confirmed with present staff. Received instructions for my participation in the procedure from the performing physician.  

## 2023-09-04 NOTE — Op Note (Signed)
[facility] Patient Name: Alexis Medina Procedure Date: 09/04/2023 11:22 AM MRN: [id number] Endoscopist: Viviann Spare P. 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 EGD to                            reassess. On pepcid twice daily which helps                            somewhat but symptoms persist. Medicines:                Monitored Anesthesia Care Procedure:                Pre-Anesthesia Assessment:                           - Prior to the procedure, a History and Physical                            was performed, and patient medications and                            allergies were reviewed. The patient's tolerance of                            previous anesthesia was also reviewed. The risks                            and benefits of the procedure and the sedation                            options and risks were discussed with the patient.                            All questions were answered, and informed consent                            was obtained. Prior Anticoagulants: The patient has                            taken no anticoagulant or antiplatelet agents. ASA                            Grade Assessment: II - A patient with mild systemic  disease. After reviewing the risks and benefits,                            the patient was deemed in satisfactory condition to                            undergo the procedure.                           After obtaining informed consent, the endoscope was                            passed under direct vision. Throughout the                            procedure, the patient's blood  pressure, pulse, and                            oxygen saturations were monitored continuously. The                            Olympus Scope [id number] was introduced through the                            mouth, and advanced to the second part of duodenum.                            The upper GI endoscopy was accomplished without                            difficulty. The patient tolerated the procedure                            well. Scope In: Scope Out: Findings:                 Esophagogastric landmarks were identified: the                            Z-line was found at 35 cm, the gastroesophageal                            junction was found at 35 cm and the upper extent of                            the gastric folds was found at 35 cm from the                            incisors.                           The exam of the esophagus was otherwise normal.                           Diffuse mildly atrophic mucosa was  found in the                            entire examined stomach. Biopsies were taken with a                            cold forceps for histology.                           The exam of the stomach was otherwise normal.                           The examined duodenum was normal. Complications:            No immediate complications. Estimated blood loss:                            Minimal. Estimated Blood Loss:     Estimated blood loss was minimal. Impression:               - Esophagogastric landmarks identified.                           - Normal esophagus.                           - Mild gastric mucosal atrophy. Biopsied.                           - Normal stomach otherwise.                           - Normal examined duodenum.                           No overt concerning pathology, will await biopsy                            results, rule out H pylori and autoimmune gastritis                            given biopsies in 2022. Consideration for trial of                             low dose PPI if symptoms persist. Recommendation:           - Patient has a contact number available for                            emergencies. The signs and symptoms of potential                            delayed complications were discussed with the                            patient. Return to normal activities tomorrow.  Written discharge instructions were provided to the                            patient.                           - Resume previous diet.                           - Continue present medications.                           - Consideration for 30 day trial of omeprazole 20mg                             / day, will discuss with patient.                           - Await pathology results. Viviann Spare P. Leoma Folds, MD 09/04/2023 11:38:07 AM This report has been signed electronically.

## 2023-09-04 NOTE — Progress Notes (Signed)
Report given to PACU, vss 

## 2023-09-04 NOTE — Patient Instructions (Addendum)
Be sure to take your omeprazole 1/2 hour before breakfast.  If you take it on a full stomach, it won't work.  Resume all of your previous medications today as ordered.  Read all of the handouts given to you by your recovery room nurse.  YOU HAD AN ENDOSCOPIC PROCEDURE TODAY AT THE [facility]:   Refer to the procedure report that was given to you for any specific questions about what was found during the examination.  If the procedure report does not answer your questions, please call your gastroenterologist to clarify.  If you requested that your care partner not be given the details of your procedure findings, then the procedure report has been included in a sealed envelope for you to review at your convenience later.  YOU SHOULD EXPECT: Some feelings of bloating in the abdomen. Passage of more gas than usual.    Please Note:  You might notice some irritation and congestion in your nose or some drainage.  This is from the oxygen used during your procedure.  There is no need for concern and it should clear up in a day or so.  SYMPTOMS TO REPORT IMMEDIATELY:   Following upper endoscopy (EGD)  Vomiting of blood or coffee ground material  New chest pain or pain under the shoulder blades  Painful or persistently difficult swallowing  New shortness of breath  Fever of 100F or higher  Black, tarry-looking stools  For urgent or emergent issues, a gastroenterologist can be reached at any hour by calling [phone]. Do not use MyChart messaging for urgent concerns.    DIET:  We do recommend a small meal at first, but then you may proceed to your regular diet.  Drink plenty of fluids but you should avoid alcoholic beverages for 24 hours. Try to avoid acidic and spicy foods and drinks. ACTIVITY:  You should plan to take it easy for the rest of today and you should NOT DRIVE or use heavy machinery until tomorrow (because of the sedation medicines used during the test).    FOLLOW  UP: Our staff will call the number listed on your records the next business day following your procedure.  We will call around 7:15- 8:00 am to check on you and address any questions or concerns that you may have regarding the information given to you following your procedure. If we do not reach you, we will leave a message.     If any biopsies were taken you will be contacted by phone or by letter within the next 1-3 weeks.  Please call us at [phone] if you have not heard about the biopsies in 3 weeks.    SIGNATURES/CONFIDENTIALITY: You and/or your care partner have signed paperwork which will be entered into your electronic medical record.  These signatures attest to the fact that that the information above on your After Visit Summary has been reviewed and is understood.  Full responsibility of the confidentiality of this discharge information lies with you and/or your care-partner.

## 2023-09-05 ENCOUNTER — Telehealth: Payer: Self-pay

## 2023-09-05 NOTE — Telephone Encounter (Signed)
  Follow up Call-     09/04/2023   10:19 AM  Call back number  Post procedure Call Back phone  # [phone]  Permission to leave phone message Yes     Patient questions:  Do you have a fever, pain , or abdominal swelling? No. Pain Score  0 *  Have you tolerated food without any problems? Yes.    Have you been able to return to your normal activities? Yes.    Do you have any questions about your discharge instructions: Diet   No. Medications  No. Follow up visit  No.  Do you have questions or concerns about your Care? No.  Actions: * If pain score is 4 or above: No action needed, pain <4.

## 2023-09-08 ENCOUNTER — Encounter (HOSPITAL_BASED_OUTPATIENT_CLINIC_OR_DEPARTMENT_OTHER)
Admission: RE | Disposition: A | Discharge status: Home / Self Care | Payer: Self-pay | Source: Home / Self Care | Attending: General Surgery

## 2023-09-08 ENCOUNTER — Ambulatory Visit (HOSPITAL_BASED_OUTPATIENT_CLINIC_OR_DEPARTMENT_OTHER)
Admission: RE | Admit: 2023-09-08 | Discharge: 2023-09-08 | Disposition: A | Discharge status: Home / Self Care | Payer: Medicare Other | Attending: General Surgery | Admitting: General Surgery

## 2023-09-08 ENCOUNTER — Encounter (HOSPITAL_BASED_OUTPATIENT_CLINIC_OR_DEPARTMENT_OTHER): Payer: Self-pay | Admitting: General Surgery

## 2023-09-08 ENCOUNTER — Ambulatory Visit (HOSPITAL_BASED_OUTPATIENT_CLINIC_OR_DEPARTMENT_OTHER): Payer: Medicare Other | Admitting: Anesthesiology

## 2023-09-08 ENCOUNTER — Other Ambulatory Visit: Payer: Self-pay

## 2023-09-08 DIAGNOSIS — R222 Localized swelling, mass and lump, trunk: Secondary | ICD-10-CM | POA: Diagnosis not present

## 2023-09-08 DIAGNOSIS — D170 Benign lipomatous neoplasm of skin and subcutaneous tissue of head, face and neck: Secondary | ICD-10-CM | POA: Diagnosis not present

## 2023-09-08 DIAGNOSIS — K219 Gastro-esophageal reflux disease without esophagitis: Secondary | ICD-10-CM | POA: Diagnosis not present

## 2023-09-08 DIAGNOSIS — I2510 Atherosclerotic heart disease of native coronary artery without angina pectoris: Secondary | ICD-10-CM | POA: Insufficient documentation

## 2023-09-08 DIAGNOSIS — Z01818 Encounter for other preprocedural examination: Secondary | ICD-10-CM

## 2023-09-08 DIAGNOSIS — D171 Benign lipomatous neoplasm of skin and subcutaneous tissue of trunk: Secondary | ICD-10-CM | POA: Insufficient documentation

## 2023-09-08 HISTORY — PX: LESION REMOVAL: SHX5196

## 2023-09-08 HISTORY — DX: Other specified postprocedural states: Z98.890

## 2023-09-08 SURGERY — EXCISION, LESION, TORSO
Anesthesia: General | Site: Chest

## 2023-09-08 MED ORDER — EPHEDRINE 5 MG/ML INJ
INTRAVENOUS | Status: AC
  Filled 2023-09-08: qty 5

## 2023-09-08 MED ORDER — LIDOCAINE 2% (20 MG/ML) 5 ML SYRINGE
INTRAMUSCULAR | Status: DC | PRN
  Administered 2023-09-08: 40 mg via INTRAVENOUS

## 2023-09-08 MED ORDER — CHLORHEXIDINE GLUCONATE CLOTH 2 % EX PADS: 6.0000 | MEDICATED_PAD | Freq: Once | CUTANEOUS | Status: DC

## 2023-09-08 MED ORDER — SODIUM CHLORIDE 0.9 % IV SOLN: INTRAVENOUS | Status: DC

## 2023-09-08 MED ORDER — GLYCOPYRROLATE PF 0.2 MG/ML IJ SOSY
PREFILLED_SYRINGE | INTRAMUSCULAR | Status: DC | PRN
  Administered 2023-09-08: .2 mg via INTRAVENOUS

## 2023-09-08 MED ORDER — AMISULPRIDE (ANTIEMETIC) 5 MG/2ML IV SOLN
INTRAVENOUS | Status: AC
  Filled 2023-09-08: qty 2

## 2023-09-08 MED ORDER — OXYCODONE HCL 5 MG PO TABS: 5.0000 mg | ORAL_TABLET | Freq: Three times a day (TID) | ORAL | 0 refills | Status: AC | PRN

## 2023-09-08 MED ORDER — DEXAMETHASONE SODIUM PHOSPHATE 10 MG/ML IJ SOLN
INTRAMUSCULAR | Status: AC
  Filled 2023-09-08: qty 1

## 2023-09-08 MED ORDER — ACETAMINOPHEN 500 MG PO TABS
1000.0000 mg | ORAL_TABLET | Freq: Once | ORAL | Status: AC
  Administered 2023-09-08: 1000 mg via ORAL

## 2023-09-08 MED ORDER — DEXAMETHASONE SODIUM PHOSPHATE 10 MG/ML IJ SOLN
INTRAMUSCULAR | Status: DC | PRN
  Administered 2023-09-08: 5 mg via INTRAVENOUS

## 2023-09-08 MED ORDER — OXYCODONE HCL 5 MG/5ML PO SOLN: 5.0000 mg | Freq: Once | ORAL | Status: DC | PRN

## 2023-09-08 MED ORDER — FENTANYL CITRATE (PF) 100 MCG/2ML IJ SOLN
INTRAMUSCULAR | Status: DC | PRN
  Administered 2023-09-08 (×2): 50 ug via INTRAVENOUS

## 2023-09-08 MED ORDER — LACTATED RINGERS IV SOLN: INTRAVENOUS | Status: DC

## 2023-09-08 MED ORDER — FENTANYL CITRATE (PF) 100 MCG/2ML IJ SOLN
25.0000 ug | INTRAMUSCULAR | Status: DC | PRN
Start: 2023-09-08 — End: 2023-09-08

## 2023-09-08 MED ORDER — STERILE WATER FOR IRRIGATION IR SOLN
Status: DC | PRN
Start: 2023-09-08 — End: 2023-09-08
  Administered 2023-09-08: 500 mL

## 2023-09-08 MED ORDER — BUPIVACAINE-EPINEPHRINE 0.25% -1:200000 IJ SOLN
INTRAMUSCULAR | Status: DC | PRN
  Administered 2023-09-08: 20 mL

## 2023-09-08 MED ORDER — ONDANSETRON HCL 4 MG/2ML IJ SOLN
INTRAMUSCULAR | Status: AC
  Filled 2023-09-08: qty 2

## 2023-09-08 MED ORDER — ACETAMINOPHEN 325 MG PO TABS: 650.0000 mg | ORAL_TABLET | Freq: Four times a day (QID) | ORAL | 0 refills | Status: AC

## 2023-09-08 MED ORDER — CELECOXIB 200 MG PO CAPS
ORAL_CAPSULE | ORAL | Status: AC
  Filled 2023-09-08: qty 1

## 2023-09-08 MED ORDER — SODIUM CHLORIDE 0.9% FLUSH: 3.0000 mL | Freq: Two times a day (BID) | INTRAVENOUS | Status: DC

## 2023-09-08 MED ORDER — LIDOCAINE HCL (PF) 2 % IJ SOLN
INTRAMUSCULAR | Status: AC
  Filled 2023-09-08: qty 5

## 2023-09-08 MED ORDER — DROPERIDOL 2.5 MG/ML IJ SOLN
0.6250 mg | Freq: Once | INTRAMUSCULAR | Status: AC
  Administered 2023-09-08: 0.625 mg via INTRAVENOUS

## 2023-09-08 MED ORDER — SODIUM CHLORIDE 0.9% FLUSH: 3.0000 mL | INTRAVENOUS | Status: DC | PRN

## 2023-09-08 MED ORDER — CEFAZOLIN SODIUM-DEXTROSE 2-4 GM/100ML-% IV SOLN
2.0000 g | INTRAVENOUS | Status: AC
  Administered 2023-09-08: 2 g via INTRAVENOUS

## 2023-09-08 MED ORDER — CELECOXIB 200 MG PO CAPS
200.0000 mg | ORAL_CAPSULE | Freq: Once | ORAL | Status: AC
  Administered 2023-09-08: 200 mg via ORAL

## 2023-09-08 MED ORDER — SODIUM CHLORIDE 0.9 % IV SOLN: 250.0000 mL | INTRAVENOUS | Status: DC | PRN

## 2023-09-08 MED ORDER — OXYCODONE HCL 5 MG PO TABS: 5.0000 mg | ORAL_TABLET | ORAL | Status: DC | PRN

## 2023-09-08 MED ORDER — PROPOFOL 10 MG/ML IV BOLUS
INTRAVENOUS | Status: AC
  Filled 2023-09-08: qty 20

## 2023-09-08 MED ORDER — ACETAMINOPHEN 325 MG PO TABS: 650.0000 mg | ORAL_TABLET | ORAL | Status: DC | PRN

## 2023-09-08 MED ORDER — ACETAMINOPHEN 325 MG RE SUPP: 650.0000 mg | RECTAL | Status: DC | PRN

## 2023-09-08 MED ORDER — IBUPROFEN 200 MG PO TABS: 600.0000 mg | ORAL_TABLET | Freq: Four times a day (QID) | ORAL | 0 refills | Status: AC

## 2023-09-08 MED ORDER — AMISULPRIDE (ANTIEMETIC) 5 MG/2ML IV SOLN
10.0000 mg | Freq: Once | INTRAVENOUS | Status: AC | PRN
Start: 2023-09-08 — End: 2023-09-08
  Administered 2023-09-08: 10 mg via INTRAVENOUS

## 2023-09-08 MED ORDER — PROPOFOL 10 MG/ML IV BOLUS
INTRAVENOUS | Status: DC | PRN
  Administered 2023-09-08: 100 mg via INTRAVENOUS

## 2023-09-08 MED ORDER — ONDANSETRON HCL 4 MG/2ML IJ SOLN
INTRAMUSCULAR | Status: DC | PRN
  Administered 2023-09-08: 4 mg via INTRAVENOUS

## 2023-09-08 MED ORDER — CEFAZOLIN SODIUM-DEXTROSE 2-4 GM/100ML-% IV SOLN
INTRAVENOUS | Status: AC
  Filled 2023-09-08: qty 100

## 2023-09-08 MED ORDER — GLYCOPYRROLATE PF 0.2 MG/ML IJ SOSY
PREFILLED_SYRINGE | INTRAMUSCULAR | Status: AC
  Filled 2023-09-08: qty 1

## 2023-09-08 MED ORDER — OXYCODONE HCL 5 MG PO TABS: 5.0000 mg | ORAL_TABLET | Freq: Once | ORAL | Status: DC | PRN

## 2023-09-08 MED ORDER — ACETAMINOPHEN 500 MG PO TABS
ORAL_TABLET | ORAL | Status: AC
  Filled 2023-09-08: qty 2

## 2023-09-08 MED ORDER — EPHEDRINE SULFATE-NACL 50-0.9 MG/10ML-% IV SOSY
PREFILLED_SYRINGE | INTRAVENOUS | Status: DC | PRN
  Administered 2023-09-08: 5 mg via INTRAVENOUS

## 2023-09-08 MED ORDER — FENTANYL CITRATE (PF) 100 MCG/2ML IJ SOLN
INTRAMUSCULAR | Status: AC
  Filled 2023-09-08: qty 2

## 2023-09-08 MED ORDER — DROPERIDOL 2.5 MG/ML IJ SOLN
INTRAMUSCULAR | Status: AC
  Filled 2023-09-08: qty 2

## 2023-09-08 SURGICAL SUPPLY — 46 items
BLADE CLIPPER SURG (BLADE) IMPLANT
BLADE SURG 15 STRL LF DISP TIS (BLADE) ×1 IMPLANT
BLADE SURG 15 STRL SS (BLADE) ×1
CANISTER SUCT 1200ML W/VALVE (MISCELLANEOUS) ×1 IMPLANT
CHLORAPREP W/TINT 26 (MISCELLANEOUS) ×1 IMPLANT
COVER BACK TABLE 60X90IN (DRAPES) ×1 IMPLANT
COVER MAYO STAND STRL (DRAPES) ×1 IMPLANT
DERMABOND ADVANCED .7 DNX12 (GAUZE/BANDAGES/DRESSINGS) ×1 IMPLANT
DRAPE LAPAROSCOPIC ABDOMINAL (DRAPES) IMPLANT
DRAPE LAPAROTOMY 100X72 PEDS (DRAPES) IMPLANT
DRAPE UTILITY XL STRL (DRAPES) ×1 IMPLANT
DRSG TEGADERM 4X4.75 (GAUZE/BANDAGES/DRESSINGS) IMPLANT
ELECT COATED BLADE 2.86 ST (ELECTRODE) ×1 IMPLANT
ELECT REM PT RETURN 9FT ADLT (ELECTROSURGICAL) ×1
ELECTRODE REM PT RTRN 9FT ADLT (ELECTROSURGICAL) ×1 IMPLANT
GAUZE PACKING IODOFORM 1/4X15 (PACKING) IMPLANT
GAUZE SPONGE 4X4 12PLY STRL LF (GAUZE/BANDAGES/DRESSINGS) IMPLANT
GLOVE BIO SURGEON STRL SZ7 (GLOVE) ×1 IMPLANT
GLOVE BIOGEL PI IND STRL 7.5 (GLOVE) ×1 IMPLANT
GOWN STRL REUS W/ TWL LRG LVL3 (GOWN DISPOSABLE) ×1 IMPLANT
GOWN STRL REUS W/ TWL XL LVL3 (GOWN DISPOSABLE) ×1 IMPLANT
GOWN STRL REUS W/TWL LRG LVL3 (GOWN DISPOSABLE) ×1
GOWN STRL REUS W/TWL XL LVL3 (GOWN DISPOSABLE) ×1
NDL HYPO 25X1 1.5 SAFETY (NEEDLE) ×1 IMPLANT
NEEDLE HYPO 25X1 1.5 SAFETY (NEEDLE) ×1
NS IRRIG 1000ML POUR BTL (IV SOLUTION) IMPLANT
PACK BASIN DAY SURGERY FS (CUSTOM PROCEDURE TRAY) ×1 IMPLANT
PENCIL SMOKE EVACUATOR (MISCELLANEOUS) ×1 IMPLANT
SPIKE FLUID TRANSFER (MISCELLANEOUS) IMPLANT
SPONGE T-LAP 18X18 ~~LOC~~+RFID (SPONGE) ×1 IMPLANT
SUT ETHILON 2 0 FS 18 (SUTURE) IMPLANT
SUT MNCRL AB 4-0 PS2 18 (SUTURE) ×1 IMPLANT
SUT SILK 2 0 SH (SUTURE) IMPLANT
SUT VIC AB 2-0 SH 18 (SUTURE) IMPLANT
SUT VIC AB 2-0 SH 27XBRD (SUTURE) IMPLANT
SUT VIC AB 3-0 SH 8-18 (SUTURE) IMPLANT
SUT VIC AB 4-0 PS2 18 (SUTURE) IMPLANT
SUT VICRYL 3-0 CR8 SH (SUTURE) ×1 IMPLANT
SWAB COLLECTION DEVICE MRSA (MISCELLANEOUS) IMPLANT
SWAB CULTURE ESWAB REG 1ML (MISCELLANEOUS) IMPLANT
SYR CONTROL 10ML LL (SYRINGE) ×1 IMPLANT
TOWEL OR 17X24 6PK STRL BLUE (TOWEL DISPOSABLE) ×1 IMPLANT
TUBE CONNECTING 12X1/4 (SUCTIONS) ×1 IMPLANT
UNDERPAD 30X36 HEAVY ABSORB (UNDERPADS AND DIAPERS) IMPLANT
WATER STERILE IRR 500ML POUR (IV SOLUTION) IMPLANT
YANKAUER SUCT BULB TIP NO VENT (SUCTIONS) ×1 IMPLANT

## 2023-09-08 NOTE — Op Note (Signed)
Preoperative diagnosis: Chest wall mass  Postoperative diagnosis: same   Procedure: Excision of chest wall mass (4cm x 3cm)  Surgeon: Melody Haver, M.D.  Anesthesia: LMA  Indications for procedure: Alexis Medina is a 77 y.o. year old female with a soft tissue mass on her anterior chest.  She presented for elective resection and prior to going to the OR written consent was obtained.  Description of procedure: The area of concern was marked in the pre-operative area after confirming the correct location with the patient. The patient was brought into the operative suite. Anesthesia was administered with General LMA anesthesia. WHO checklist was applied. The patient was then placed in supine position. The area was prepped and draped in the usual sterile fashion. 0.25% marcaine was injected in the area.  A vertical incision was made overlying the previously marked area with a #15 blade.  I used electrocautery to carry the incision down through the subcutaneous tissue, identifying a lipomatous lesion. The lesion was grasped and carefully dissected free of surrounding tissue using electrocautery.  The specimen was transected and passed off the field.  The lesion measured 4cm x 3cm. The wound was inspected for hemostasis.  The wound was directly adjacent to the sternum.  The deep layers were closed using interrupted 2-0 vicryl. The skin was approximated with interrupted 3-0 vicryl. Dermabond was applied.  The instrument, needle, and sponge counts were reported as correct.   Findings: Lipomatous lesion   Specimen: Chest wall mass  Blood loss: 10mL  Local anesthesia:  20 ml marcaine   Complications: None  Melody Haver, M.D. General Surgery [facility], [place]

## 2023-09-08 NOTE — Discharge Instructions (Addendum)
Outpatient Surgery Home Care Instruction  Activity  The effects of anesthesia are still present and drowsiness may result.  Limit activity for the first 24 hours, then you may return to normal daily activities. Returning to normal daily activities as soon as you can following surgery will enhance recovery time.  Do not drive or operate heavy machinery within 24 hours of taking narcotic pain medications.   Do not mow the lawn, use a vacuum cleaner, or do any other strenuous activities without first consulting your surgical team.   Diet Drink plenty of fluids and eat light meals today, then resume regular diet. Some patients may find their appetite is poor for a week or two after surgery. This is a normal result of the stress of surgery-your appetite will return in time.   There are no specific diet restrictions after surgery.   Dressing and Wound Care  Keep your wound or incision site clean and dry.  You may have different types of dressings covering your incisions depending on your operation and your surgeon: o Dermabond/Durabond (skin glue): This will usually remain in place for 10-14 days, then naturally fall off your skin. You may take a shower 24 hrs after surgery, carefully wash, not scrub the incision site with a mild non-scented soap. Pat dry with a soft towel.  Do not pick or peel skin glue off.  Do not use creams, powder, salves or balms on your incision(s).       What to Expect After Surgery   Moderate discomfort controlled with medications  Minimal drainage from incision  Feeling fatigue and weak  Constipation after surgery is common. Drink plenty fluids and eat a high fiber diet.   Pain Control: Prescribed Non-Narcotic Pain Medication  You will be given three prescriptions.  Two of them will be for prescription strength ibuprofen (i.e. Advil) and prescription strength acetaminophen (i.e. Tylenol).  The vast majority of patients will just need these two medications.  One  prescription will be for a 'rescue' prescription of an oral narcotic (oxycodone).  You may fill this if needed.  You will alternate taking the ibuprofen (600mg ) every 6 hours and also the acetaminophen (650mg ) every 6 hours so that you are taking one of those medications every 3 hours.  For example: o 0800 - take ibuprofen 600mg  o 1100 - take acetaminophen 650mg  o 1400 - take ibuprofen 600mg  o 1700 - take acetaminophen 650mg  o Etc.  Continue taking this alternating pattern of ibuprofen and acetaminophen for 3 days  If you cannot take one or the other of these medications, just take the one you can every 6 hours.  If you are comfortable at night, you don't have to wake up and take a medication.  If you are still uncomfortable after taking either ibuprofen or acetaminophen, try gentle stretching exercise and ice packs (a bag of frozen vegetables works great).  If you are still uncomfortable, you may fill the narcotic prescription of Oxycodone and take as directed.  Once you have completed these prescriptions, your pain level should be low enough to stop taking medications altogether or just use an over the counter medication (ibuprofen or acetaminophen) as needed.    Pain Control: Over the Counter Medications to take as needed  Colace/Docusate: May be prescribed by your surgeon to prevent constipation caused by the combination of narcotics, effects of anesthesia, and decreased ambulation.  Hold for loose stools or diarrhea. Take 100 mg 1-2 times a day starting tonight.   Fiber: High  fiber foods, extra liquids (water 9-13 cups/day) can also assist with constipation. Examples of high fiber foods are fruit, bran. Prune juice and water are also good liquids to drink.  Milk of Magnesia/Miralax:  If constipated despite takeing the over the counter stool softeners, you may take Milk of Magnesia or Miralax as directed on bottle to assist with constipation.     Pepcid/Famotidine: May be prescribed while  taking naproxen (Aleve) or other NSAIDs such as ibuprofen (Motrin/Advil) to prevent stomach upset or Acid-reflux symptoms. Take 1 tablet 1-2 times a day.   **Constipation: The first bowel movement may occur anywhere between 1-5 days after surgery.  As long as you are not nauseated or not having significant abdominal pain this variation is acceptable. Narcotic pain medications can cause constipation increasing discomfort; early discontinuation will assist with bowel management. If constipated despite taking stool softeners, you may take Milk of Magnesia or Miralax as directed on the bottle.     **Home medications: You may restart your home medications as directed by your respective Primary Care Physician or Surgeon.   When to notify your Doctor or Healthcare Team   Sign of Wound Infection   Fever over 100 degrees.  Wound becomes extremely swollen, shows red streaks, warm to the touch, and/or drainage from the incision site or foul-smelling drainage.  Wound edges separate or opens up  Bleeding or bruising   If you have bleeding, apply pressure to the site and hold the pressure firmly for 5 minutes. If the bleeding continues, apply pressure again and call 911. If the bleeding stopped, call your doctor to report it.   Call your doctor or nurse if you have increased bleeding from your site and increased bruising or a lump forms or gets larger under your skin at the site. Unrelieved Pain   Call your doctor or nurse if your pain gets worse or is not eased 1 hour after taking your pain medicine, or if it is severe and uncontrolled. Nausea and Vomiting   Call your doctor or nurse if you have nausea and vomiting that continues more than 24 hours, will not let you keep medicine down and will not let you keep fluids down  Fever, Flu-like symptoms   Fever over 100 degrees and/or chills  Gastrointestinal Bleeding Symptoms    Black tarry bowel movements.  This can be normal after surgery on the stomach, but  should resolve in a day or two.    Call 911 if you suddenly have signs of blood loss such as:  Vomiting blood  Fast heart rate  Feeling faint, sweaty, or blacking out  Passing bright red blood from your rectum  Blood Clot Symptoms   Tender, swollen or reddened areas in your calf muscle or thighs.  Numbness or tingling in your lower leg or calf, or at the top of your leg or groin  Skin on your leg looks pale or blue or feels cold to touch  Chest pain or have trouble breathing, lightheadedness, fast heart rate  Sudden Onset of Symptoms    Call 911 if you suddenly have:  Leg weakness and spasm  Loss of bladder or bowel function  Seizure  Confusion, severe headache, dizziness or feeling unsteady, problems talking, difficulty swallowing, and/or numbness or muscle weakness as these could be signs of a stroke.  Follow up Appointment Your follow up appointment should be scheduled 2-3 weeks after your surgery date.  If you have not previously scheduled for a follow-up visit you can  be scheduled by contacting [phone].    Post Anesthesia Home Care Instructions  Activity: Get plenty of rest for the remainder of the day. A responsible individual must stay with you for 24 hours following the procedure.  For the next 24 hours, DO NOT: -Drive a car -Advertising copywriter -Drink alcoholic beverages -Take any medication unless instructed by your physician -Make any legal decisions or sign important papers.  Meals: Start with liquid foods such as gelatin or soup. Progress to regular foods as tolerated. Avoid greasy, spicy, heavy foods. If nausea and/or vomiting occur, drink only clear liquids until the nausea and/or vomiting subsides. Call your physician if vomiting continues.  Special Instructions/Symptoms: Your throat may feel dry or sore from the anesthesia or the breathing tube placed in your throat during surgery. If this causes discomfort, gargle with warm salt water. The discomfort should  disappear within 24 hours.

## 2023-09-08 NOTE — Transfer of Care (Signed)
Immediate Anesthesia Transfer of Care Note  Patient: Alexis Medina  Procedure(s) Performed: EXCISION OF CHEST WALL LESION (Chest)  Patient Location: PACU  Anesthesia Type:General  Level of Consciousness: awake, alert , oriented, and patient cooperative  Airway & Oxygen Therapy: Patient Spontanous Breathing  Post-op Assessment: Report given to RN and Post -op Vital signs reviewed and stable  Post vital signs: Reviewed and stable  Last Vitals:  Vitals Value Taken Time  BP 130/65 09/08/23 1304  Temp 36.4 C 09/08/23 1304  Pulse 102 09/08/23 1306  Resp 11 09/08/23 1305  SpO2 97 % 09/08/23 1306  Vitals shown include unfiled device data.  Last Pain:  Vitals:   09/08/23 1042  TempSrc: Oral  PainSc: 0-No pain      Patients Stated Pain Goal: 5 (09/08/23 1042)  Complications: No notable events documented.

## 2023-09-08 NOTE — Anesthesia Preprocedure Evaluation (Signed)
Anesthesia Evaluation  Patient identified by MRN, date of birth, ID band Patient awake    Reviewed: Allergy & Precautions, NPO status , Patient's Chart, lab work & pertinent test results  History of Anesthesia Complications (+) PONV and history of anesthetic complications  Airway Mallampati: II  TM Distance: >3 FB Neck ROM: Full    Dental no notable dental hx.    Pulmonary neg pulmonary ROS   Pulmonary exam normal        Cardiovascular + CAD   Rhythm:Regular Rate:Normal     Neuro/Psych  Headaches  negative psych ROS   GI/Hepatic Neg liver ROS,GERD  ,,  Endo/Other  negative endocrine ROS    Renal/GU negative Renal ROS  negative genitourinary   Musculoskeletal  (+) Arthritis , Osteoarthritis,    Abdominal Normal abdominal exam  (+)   Peds  Hematology negative hematology ROS (+)   Anesthesia Other Findings   Reproductive/Obstetrics                             Anesthesia Physical Anesthesia Plan  ASA: 3  Anesthesia Plan: General   Post-op Pain Management: Celebrex PO (pre-op)* and Tylenol PO (pre-op)*   Induction: Intravenous  PONV Risk Score and Plan: 4 or greater and Ondansetron, Dexamethasone, Treatment may vary due to age or medical condition and Amisulpride  Airway Management Planned: Mask and LMA  Additional Equipment: None  Intra-op Plan:   Post-operative Plan: Extubation in OR  Informed Consent: I have reviewed the patients History and Physical, chart, labs and discussed the procedure including the risks, benefits and alternatives for the proposed anesthesia with the patient or authorized representative who has indicated his/her understanding and acceptance.     Dental advisory given  Plan Discussed with: CRNA  Anesthesia Plan Comments:        Anesthesia Quick Evaluation

## 2023-09-08 NOTE — Anesthesia Procedure Notes (Signed)
Procedure Name: LMA Insertion Date/Time: 09/08/2023 12:18 PM  Performed by: Bishop Limbo, CRNAPre-anesthesia Checklist: Patient identified, Emergency Drugs available, Suction available and Patient being monitored Patient Re-evaluated:Patient Re-evaluated prior to induction Oxygen Delivery Method: Circle System Utilized Preoxygenation: Pre-oxygenation with 100% oxygen Induction Type: IV induction Ventilation: Mask ventilation without difficulty LMA: LMA inserted LMA Size: 3.0 Number of attempts: 1 Placement Confirmation: positive ETCO2 Tube secured with: Tape Dental Injury: Teeth and Oropharynx as per pre-operative assessment

## 2023-09-08 NOTE — H&P (Signed)
Alexis Medina 05/12/1946  [id number].    HPI:  77 y/o F who presents for elective removal of a chest wall mass. She is in her usual state of health and denies any recent changes in medication.   ROS: Review of Systems  Constitutional: Negative.   HENT: Negative.    Eyes: Negative.   Respiratory: Negative.    Cardiovascular: Negative.   Gastrointestinal: Negative.   Genitourinary: Negative.   Musculoskeletal: Negative.   Skin: Negative.   Neurological: Negative.   Endo/Heme/Allergies: Negative.   Psychiatric/Behavioral: Negative.      Family History  Problem Relation Age of Onset   Alzheimer's disease Mother    Heart failure Mother    Heart failure Father    Heart disease Brother    Hypertension Brother    Stroke Brother    Atrial fibrillation Brother    Healthy Son    Breast cancer Cousin    Colon cancer Neg Hx    Colon polyps Neg Hx    Esophageal cancer Neg Hx    Rectal cancer Neg Hx    Stomach cancer Neg Hx     Past Medical History:  Diagnosis Date   Arthritis    Cataract    Concussion 2020   GERD (gastroesophageal reflux disease)    History of Helicobacter pylori infection    Hormone replacement therapy    Hyperlipidemia    Osteopenia    Osteoporosis    PONV (postoperative nausea and vomiting)     Past Surgical History:  Procedure Laterality Date   EYE SURGERY     cararacts both eyes   LUMBAR DISC SURGERY     TONSILLECTOMY     TUBAL LIGATION     UPPER GI ENDOSCOPY      Social History:  reports that she has never smoked. She has never used smokeless tobacco. She reports that she does not currently use alcohol. She reports that she does not use drugs.  Allergies: No Known Allergies  Medications Prior to Admission  Medication Sig Dispense Refill   Calcium Carb-Cholecalciferol (CALCIUM 600+D3) 600-20 MG-MCG TABS      cholecalciferol (VITAMIN D) 1000 UNITS tablet Take 1,000 Units by mouth daily.     clotrimazole-betamethasone (LOTRISONE)  cream APPLY TO AFFECTED AREA OF NAILS TWICE DAILY     cyclobenzaprine (FLEXERIL) 5 MG tablet Take 5 mg by mouth at bedtime as needed.     Docosahexaenoic Acid (DHA OMEGA 3) 100 MG CAPS TAKE 2 EACH BY MOUTH DAILY. 60 capsule 12   famotidine (PEPCID) 20 MG tablet TAKE 1 TABLET TWICE A DAY (PLEASE CALL [phone] TO SCHEDULE AN OFFICE VISIT FOR FURTHER REFILLS) 60 tablet 0   Multiple Vitamin (MULTIVITAMIN) capsule Take 1 capsule by mouth daily.     omeprazole (PRILOSEC) 20 MG capsule Take 1 capsule (20 mg total) by mouth daily. 30 capsule 1   PREMPRO 0.3-1.5 MG tablet TAKE 1 TABLET DAILY 84 tablet 3   RESTASIS 0.05 % ophthalmic emulsion Place 1 drop into both eyes 2 (two) times daily.     tazarotene (AVAGE) 0.1 % cream Apply topically at bedtime. For acne     traMADol (ULTRAM) 50 MG tablet Take 1 tablet (50 mg total) by mouth every 6 (six) hours as needed. (Patient taking differently: Take 50 mg by mouth 2 (two) times daily.) 200 tablet 1   alendronate (FOSAMAX) 70 MG tablet Take 70 mg by mouth once a week. Take with a full glass of water  on an empty stomach.      Physical Exam: Blood pressure (!) 147/73, pulse 67, temperature 97.9 F (36.6 C), temperature source Oral, resp. rate 16, height 5\' 3"  (1.6 m), weight 50.8 kg, SpO2 99%. Gen: female, NAD Chest: palpable lesion in the mid chest, the area was marked with the patient to confirm that this is the area of interest   No results found for this or any previous visit (from the past 48 hour(s)). No results found.  Assessment/Plan 77 y/o F w/ a mass on her chest who presents for elective removal.   - Will proceed to the OR for the planned operation.  We discussed the alternatives and potential risks of surgery, including but not limited to: bleeding, infection, and recurrence. All questions were addressed and consent was obtained.   - Tentative plan for discharge from PACU  [facility] 09/08/2023, 12:00  PM Please see Amion for pager number during day hours 7:00am-4:30pm or 7:00am -11:30am on weekends

## 2023-09-09 LAB — SURGICAL PATHOLOGY

## 2023-09-09 NOTE — Anesthesia Postprocedure Evaluation (Signed)
Anesthesia Post Note  Patient: Alexis Medina  Procedure(s) Performed: EXCISION OF CHEST WALL LESION (Chest)     Patient location during evaluation: PACU Anesthesia Type: General Level of consciousness: awake and alert Pain management: pain level controlled Vital Signs Assessment: post-procedure vital signs reviewed and stable Respiratory status: spontaneous breathing, nonlabored ventilation, respiratory function stable and patient connected to nasal cannula oxygen Cardiovascular status: blood pressure returned to baseline and stable Postop Assessment: no apparent nausea or vomiting Anesthetic complications: no   No notable events documented.  Last Vitals:  Vitals:   09/08/23 1400 09/08/23 1445  BP: 130/71 117/65  Pulse: 85 73  Resp: 17 16  Temp: 36.5 C 36.5 C  SpO2: 93% 96%    Last Pain:  Vitals:   09/08/23 1445  TempSrc:   PainSc: 0-No pain                 Earl Lites P Iva Montelongo

## 2023-09-10 ENCOUNTER — Encounter (HOSPITAL_BASED_OUTPATIENT_CLINIC_OR_DEPARTMENT_OTHER): Payer: Self-pay | Admitting: General Surgery

## 2023-09-10 DIAGNOSIS — M810 Age-related osteoporosis without current pathological fracture: Secondary | ICD-10-CM | POA: Diagnosis not present

## 2023-09-10 DIAGNOSIS — M48061 Spinal stenosis, lumbar region without neurogenic claudication: Secondary | ICD-10-CM | POA: Diagnosis not present

## 2023-09-10 DIAGNOSIS — I700 Atherosclerosis of aorta: Secondary | ICD-10-CM | POA: Diagnosis not present

## 2023-09-10 DIAGNOSIS — Z23 Encounter for immunization: Secondary | ICD-10-CM | POA: Diagnosis not present

## 2023-09-10 DIAGNOSIS — E7800 Pure hypercholesterolemia, unspecified: Secondary | ICD-10-CM | POA: Diagnosis not present

## 2023-09-10 LAB — SURGICAL PATHOLOGY

## 2023-09-11 ENCOUNTER — Other Ambulatory Visit: Payer: Self-pay

## 2023-09-11 DIAGNOSIS — K2970 Gastritis, unspecified, without bleeding: Secondary | ICD-10-CM

## 2023-09-11 DIAGNOSIS — R1013 Epigastric pain: Secondary | ICD-10-CM

## 2023-09-12 ENCOUNTER — Other Ambulatory Visit: Payer: Self-pay

## 2023-09-12 ENCOUNTER — Other Ambulatory Visit: Payer: Medicare Other

## 2023-09-12 DIAGNOSIS — K2970 Gastritis, unspecified, without bleeding: Secondary | ICD-10-CM

## 2023-09-12 DIAGNOSIS — R1013 Epigastric pain: Secondary | ICD-10-CM | POA: Diagnosis not present

## 2023-09-17 ENCOUNTER — Telehealth: Payer: Self-pay | Admitting: Gastroenterology

## 2023-09-17 MED ORDER — PANTOPRAZOLE SODIUM 20 MG PO TBEC: 20.0000 mg | DELAYED_RELEASE_TABLET | Freq: Every day | ORAL | 0 refills | Status: DC

## 2023-09-17 NOTE — Telephone Encounter (Signed)
Inbound call from patient, states prolisec is causing her to break out in a rash and also states she does not think she should continue taking it due to these side effects. Patient would like to discuss other possible medications.

## 2023-09-17 NOTE — Telephone Encounter (Signed)
Discussed Dr Lanetta Inch recommendations with patient. She verbalizes understanding. New rx sent for pantoprazole 20 mg daily to [facility].

## 2023-09-17 NOTE — Telephone Encounter (Signed)
Okay. Why don't we stop the omeprazole and see if rash resolved. If it does then let's try protonix 20mg  / day in its place if you can let her know and order that for her. thanks

## 2023-09-17 NOTE — Telephone Encounter (Signed)
Patient calls stating that she began taking omeprazole 7 days ago. Over the last 2 days, she has started experiencing body rash and slight headache. She states that the rash does not itch or hurt. There has been no SOB, chest pain or other alarming symptoms. Patient is not taking any other new medications; She says that it does seem that the omeprazole helped her GI symptoms for the few days she took it prior to rash/HA.  Patient is advised to stop the omeprazole at this time.  Please advise.Marland KitchenMarland Kitchen

## 2023-09-23 LAB — INTRINSIC FACTOR ANTIBODIES: Intrinsic Factor: NEGATIVE

## 2023-09-23 LAB — REFLEX PARIETAL CELL AB TITER: PARIETAL CELL AB TITER: 1:640 {titer} — ABNORMAL HIGH

## 2023-09-23 LAB — ANTI-PARIETAL ANTIBODY: PARIETAL CELL AB SCREEN: POSITIVE — AB

## 2023-09-29 ENCOUNTER — Other Ambulatory Visit: Payer: Self-pay

## 2023-09-29 DIAGNOSIS — K2970 Gastritis, unspecified, without bleeding: Secondary | ICD-10-CM

## 2023-09-29 DIAGNOSIS — R1013 Epigastric pain: Secondary | ICD-10-CM

## 2023-10-01 ENCOUNTER — Other Ambulatory Visit (INDEPENDENT_AMBULATORY_CARE_PROVIDER_SITE_OTHER): Payer: Medicare Other

## 2023-10-01 DIAGNOSIS — R1013 Epigastric pain: Secondary | ICD-10-CM | POA: Diagnosis not present

## 2023-10-01 DIAGNOSIS — K2970 Gastritis, unspecified, without bleeding: Secondary | ICD-10-CM | POA: Diagnosis not present

## 2023-10-01 LAB — IBC + FERRITIN
Ferritin: 31.1 ng/mL (ref 10.0–291.0)
Iron: 101 ug/dL (ref 42–145)
Saturation Ratios: 31.9 % (ref 20.0–50.0)
TIBC: 316.4 ug/dL (ref 250.0–450.0)
Transferrin: 226 mg/dL (ref 212.0–360.0)

## 2023-10-01 LAB — VITAMIN B12: Vitamin B-12: 1029 pg/mL — ABNORMAL HIGH (ref 211–911)

## 2023-10-09 ENCOUNTER — Other Ambulatory Visit: Payer: Self-pay | Admitting: Gastroenterology

## 2023-11-13 ENCOUNTER — Other Ambulatory Visit: Payer: Self-pay | Admitting: Family Medicine

## 2023-11-13 DIAGNOSIS — Z1231 Encounter for screening mammogram for malignant neoplasm of breast: Secondary | ICD-10-CM

## 2023-11-26 ENCOUNTER — Ambulatory Visit
Admission: RE | Admit: 2023-11-26 | Discharge: 2023-11-26 | Disposition: A | Discharge status: Home / Self Care | Payer: Medicare Other | Source: Ambulatory Visit | Attending: Family Medicine | Admitting: Family Medicine

## 2023-11-26 DIAGNOSIS — Z1231 Encounter for screening mammogram for malignant neoplasm of breast: Secondary | ICD-10-CM | POA: Insufficient documentation

## 2023-12-01 ENCOUNTER — Other Ambulatory Visit: Payer: Self-pay

## 2023-12-01 MED ORDER — PANTOPRAZOLE SODIUM 20 MG PO TBEC: 20.0000 mg | DELAYED_RELEASE_TABLET | Freq: Every day | ORAL | 1 refills | Status: DC

## 2023-12-01 NOTE — Progress Notes (Signed)
 Refill request for pantoprazole  20mg  - 90 day supply to [facility]

## 2023-12-09 ENCOUNTER — Encounter: Payer: Self-pay | Admitting: Gastroenterology

## 2023-12-23 NOTE — Patient Instructions (Incomplete)
 Alexis Medina

## 2023-12-23 NOTE — Progress Notes (Unsigned)
 GYNECOLOGY PROGRESS NOTE  Subjective:    Patient ID: Alexis Medina, female    DOB: [birth year], 78 y.o.   MRN: [id number]  HPI  Patient is a 78 y.o. G53P1011 female who presents for complaints of postmenopausal bleeding.  Notes that it occurs infrequently, maybe once a year. Describes it as more of a discharge, light brown. Tends to notice it during times of stress (physical or emotional). Has some mild associated cramping.  Has been evaluated for PMB in the past, with negative workup. Is currently on HRT.   The following portions of the patient's history were reviewed and updated as appropriate: allergies, current medications, past family history, past medical history, past social history, past surgical history, and problem list.  Review of Systems Pertinent items are noted in HPI.   Objective:   Blood pressure 126/73, pulse 72, resp. rate 16, height 5\' 3"  (1.6 m), weight 111 lb 11.2 oz (50.7 kg). Body mass index is 19.79 kg/m. General appearance: alert and no distress Abdomen: soft, non-tender; bowel sounds normal; no masses,  no organomegaly Pelvic: external genitalia normal, rectovaginal septum normal.  Vagina atrophic, with scant thin white discharge, no bleeding.  Cervix normal appearing, no lesions or discharge. Uterus normal shape, size, non-tender.  Adnexae non-palpable, nontender bilaterally.  Extremities: extremities normal, atraumatic, no cyanosis or edema Neurologic: Grossly normal   Imaging:  Patient Name: Alexis Medina DOB: 1945/11/08 MRN: [id number]   ULTRASOUND REPORT   Location: [facility] Date of Service: 09/03/2018        Indications:PMB Findings:  The uterus is anteverted and measures 7.3x3.6x6.6cm. Echo texture is heterogenous with evidence of focal masses. Within the uterus are multiple suspected fibroids measuring: Fibroid 1:Lt anterior uterine wall ,lobulated,  IM vs SM , 2.7x2.6x2.9cm Fibroid 2: Rt anterior uterine wall, more echogenic than  fibroid 1, IM vs SM,  1.9x1.8cm Fibroid 3: Rt posterior uterine wall, IM vs SS, 1.4x1.3 The Endometrium measures 6.1 mm.Apears with multiple calcifications   Right Ovary measures 3.8 cm3. It is normal in appearance. Left Ovary measures 2.5  cm3. It is normal in appearance. Survey of the adnexa demonstrates no adnexal masses. There is no free fluid in the cul de sac.   Impression: 1. Within the uterus are multiple suspected fibroids measuring: Fibroid 1:Lt anterior uterine wall ,lobulated,  IM vs SM , 2.7x2.6x2.9cm Fibroid 2: Rt anterior uterine wall, more echogenic than fibroid 1, IM vs SM,  1.9x1.8cm Fibroid 3: Rt posterior uterine wall, IM vs SS, 1.4x1.3 2. The Endometrium measures 6.1 mm.Apears with multiple calcifications     Recommendations: 1.Clinical correlation with the patient's History and Physical Exam.     Abeer Alsammarraie,RDMS       I have reviewed this study and agree with documented findings.      Hildred Laser, MD [facility]     Assessment:   1. Postmenopausal bleeding   2. History of uterine fibroid   3. Post-menopause on HRT (hormone replacement therapy)      Plan:   1. Postmenopausal bleeding (Primary) - Patient with h/o PMB in the past with negative workup. Could be due to atrophy, also with h/o small fibroids. Last [place] was in 2019, will repeat. If EMS is thickened, further evaluation may be warranted with endometrial biopsy. - US PELVIC COMPLETE WITH TRANSVAGINAL; Future  2. History of uterine fibroid - US PELVIC COMPLETE WITH TRANSVAGINAL; Future  3. Post-menopause on HRT (hormone replacement therapy) - Currently on Prempro, lowest dose.  Has questions about attempting to wean. Has tried weaning in the past but was unsuccessful. Advised to go to every other day dosing for several weeks, then every third day if tolerated. Desires refill on medication. Prescription sent.   Return to clinic for any scheduled appointments or for any  gynecologic concerns as needed.    Hildred Laser, MD [facility] at [facility]

## 2023-12-24 ENCOUNTER — Encounter: Payer: Self-pay | Admitting: Obstetrics and Gynecology

## 2023-12-24 ENCOUNTER — Ambulatory Visit (INDEPENDENT_AMBULATORY_CARE_PROVIDER_SITE_OTHER): Payer: Medicare Other | Admitting: Obstetrics and Gynecology

## 2023-12-24 VITALS — BP 126/73 | HR 72 | Resp 16 | Ht 63.0 in | Wt 111.7 lb

## 2023-12-24 DIAGNOSIS — N950 Postmenopausal bleeding: Secondary | ICD-10-CM

## 2023-12-24 DIAGNOSIS — Z79890 Hormone replacement therapy (postmenopausal): Secondary | ICD-10-CM | POA: Diagnosis not present

## 2023-12-24 DIAGNOSIS — Z86018 Personal history of other benign neoplasm: Secondary | ICD-10-CM | POA: Diagnosis not present

## 2024-01-09 ENCOUNTER — Other Ambulatory Visit: Payer: Self-pay | Admitting: Gastroenterology

## 2024-01-16 ENCOUNTER — Other Ambulatory Visit

## 2024-02-17 ENCOUNTER — Other Ambulatory Visit

## 2024-02-25 ENCOUNTER — Ambulatory Visit
Admission: RE | Admit: 2024-02-25 | Discharge: 2024-02-25 | Disposition: A | Discharge status: Home / Self Care | Source: Ambulatory Visit | Attending: Obstetrics and Gynecology | Admitting: Obstetrics and Gynecology

## 2024-02-25 DIAGNOSIS — N950 Postmenopausal bleeding: Secondary | ICD-10-CM | POA: Diagnosis present

## 2024-02-25 DIAGNOSIS — Z86018 Personal history of other benign neoplasm: Secondary | ICD-10-CM | POA: Insufficient documentation

## 2024-04-15 ENCOUNTER — Ambulatory Visit: Admitting: Obstetrics and Gynecology

## 2024-05-14 ENCOUNTER — Encounter
Admission: RE | Admit: 2024-05-14 | Discharge: 2024-05-14 | Disposition: A | Discharge status: Home / Self Care | Source: Ambulatory Visit | Attending: Obstetrics and Gynecology | Admitting: Obstetrics and Gynecology

## 2024-05-14 ENCOUNTER — Other Ambulatory Visit: Payer: Self-pay

## 2024-05-14 VITALS — Ht 63.0 in | Wt 112.0 lb

## 2024-05-14 DIAGNOSIS — Z01818 Encounter for other preprocedural examination: Secondary | ICD-10-CM

## 2024-05-14 NOTE — Patient Instructions (Addendum)
 Your procedure is scheduled on: FRIDAY 05/21/24 Report to the Registration Desk on the 1st floor of the [facility]. To find out your arrival time, please call [phone] between 1PM - 3PM on: THURS 05/18/24 If your arrival time is 6:00 am, do not arrive before that time as the [facility] entrance doors do not open until 6:00 am.  REMEMBER: Instructions that are not followed completely may result in serious medical risk, up to and including death; or upon the discretion of your surgeon and anesthesiologist your surgery may need to be rescheduled.  Do not eat food after midnight the night before surgery.  No gum chewing or hard candies.  You may however, drink CLEAR liquids up to 2 hours before you are scheduled to arrive for your surgery. Do not drink anything within 2 hours of your scheduled arrival time.  Clear liquids include: - water   - apple juice without pulp - gatorade (not RED colors) - black coffee or tea (Do NOT add milk or creamers to the coffee or tea) Do NOT drink anything that is not on this list.    One week prior to surgery: Stop Anti-inflammatories (NSAIDS) such as Advil , Aleve, Ibuprofen , Motrin , Naproxen, Naprosyn and Aspirin  based products such as Excedrin, Goody's Powder, BC Powder.  Stop ANY OVER THE COUNTER supplements until after surgery.  You may however, continue to take Tylenol  if needed for pain up until the day of surgery.  Continue taking all of your other prescription medications up until the day of surgery.  ON THE DAY OF SURGERY ONLY TAKE THESE MEDICATIONS WITH SIPS OF WATER :  pantoprazole  (PROTONIX )   Use inhalers on the day of surgery and bring to the hospital.  No Alcohol  for 24 hours before or after surgery.   No Smoking including e-cigarettes for 24 hours before surgery.  No chewable tobacco products for at least 6 hours before surgery.  No nicotine patches on the day of surgery.  Do not use any recreational drugs for at least a  week (preferably 2 weeks) before your surgery.  Please be advised that the combination of cocaine and anesthesia may have negative outcomes, up to and including death. If you test positive for cocaine, your surgery will be cancelled.  On the morning of surgery brush your teeth with toothpaste and water , you may rinse your mouth with mouthwash if you wish. Do not swallow any toothpaste or mouthwash.   Do not wear jewelry, make-up, hairpins, clips or nail polish.  For welded (permanent) jewelry: bracelets, anklets, waist bands, etc.  Please have this removed prior to surgery.  If it is not removed, there is a chance that hospital personnel will need to cut it off on the day of surgery.  Do not wear lotions, powders, or perfumes.   Do not shave body hair from the neck down 48 hours before surgery.  Contact lenses, hearing aids and dentures may not be worn into surgery.  Do not bring valuables to the hospital. [facility] is not responsible for any missing/lost belongings or valuables.   Notify your doctor if there is any change in your medical condition (cold, fever, infection).  Wear comfortable clothing (specific to your surgery type) to the hospital.  After surgery, you can help prevent lung complications by doing breathing exercises.  Take deep breaths and cough every 1-2 hours.  If you are being admitted to the hospital overnight, leave your suitcase in the car. After surgery it may be brought to your  room.  In case of increased patient census, it may be necessary for you, the patient, to continue your postoperative care in the Same Day Surgery department.  If you are being discharged the day of surgery, you will not be allowed to drive home. You will need a responsible individual to drive you home and stay with you for 24 hours after surgery.   If you are taking public transportation, you will need to have a responsible individual with you.  Please call the Pre-admissions  Testing Dept. at [phone] if you have any questions about these instructions.  Surgery Visitation Policy:  Patients having surgery or a procedure may have two visitors.  Children under the age of 23 must have an adult with them who is not the patient.  Merchandiser, retail to address health-related social needs:  [email or web address]

## 2024-05-17 NOTE — H&P (Signed)
 GYN H&P   CC: postmenopausal bleeding   HPI: Alexis Medina 78 y.o. [id number] postmenopausal female on HRT with a hx of osteopenia who presents as a referral for postmenopausal bleeding.  Scheduled for hysteroscopy, D&C, possible Myosure polypectomy, colposcopy, cervical biopsies, and ECC on 05/21/24.   Reports intermittent bleeding for the past 6 years. Reports up to a few episodes per year for up to a few days. Spots of brownish/red blood in underwear. +Cramping. Reports menopause around age 1 and has been in Prempro  since then. Has tried to decrease dose (taking it every 2-3 days) over the years but had symptoms so always went back to taking. Started it initially for mood swings, irritability, and personality changes. Reports these symptoms seem to reoccur when has tried to titrate off of it in the past but has never fully gone of it is- has just decrease dose or taken every other day.     U/s on 02/25/24 showed a slightly enlarged uterus (8x4.6x5.3cm) with two 2 cm fibroids and a EMS of 6 mm.    Reviewed previous records- notes, labs, ultrasounds. Per records one episode of PMB in 2019. 09/03/18 u/s showed a 6.102mm EMS and multiple small fibroids. Seen on 09/09/18 for an endometrial biopsy which showed an endometrioid type polyp in the background of atrophic endometrium, negative for hyperplasia/ malignancy. Seen 07/11/20 and 07/10/22 and denied postmenopausal bleeding. Last seen on 12/24/23.      ROS: All other systems reviewed and negative   PMHx: Past Medical History      Past Medical History:  Diagnosis Date   Concussion     Fibroid     Menopausal syndrome     Osteopenia     Postmenopausal bleeding     Reflux esophagitis          PSHx: Past Surgical History       Past Surgical History:  Procedure Laterality Date   TUBAL LIGATION   10/21/1988   E/O CHEST WALL LESION   09/08/2023    Dr Polly   TONSILLECTOMY            OBHx:                  OB History  Gravida Para Term  Preterm AB Living   3 1 1   2 1    SAB IAB Ectopic Molar Multiple Live Births   1         1      # Outcome Date GA Lbr Len/2nd Weight Sex Type Anes PTL Lv  3 Term 11/17/82     3.459 kg (7 lb 10 oz) M Vag-Spont     LIV  2 AB                    1 SAB                 FD      GYN Hx: - LMP: No LMP recorded (lmp unknown). Patient is postmenopausal.           - Menopause: 28, has been on Prempro  since menopause - Pap hx:+history of abnormals- never needed a colpo, last unknown. Never had any excisional procedures - STI hx: Denies any history of STIs  - Sexual preference: Sexually active with husband - Dyspareunia or sexual concerns: +vaginal dryness but denies pain - Abdominal surgeries: BTL - GYN procedures: none   Breast Health Screening Breast concerns or complaints: no History of previous  breast biopsy or other breast surgery: no Personal history of breast cancer: no    Genitourinary Screening  Urinary complaints or concerns: +urgency, denies urge incontinence. Denies stress incontinence.      FHx: Denies FHx of ovarian, breast, uterine, cervical, and colon cancer   Meds: Medications Ordered Prior to Encounter        Current Outpatient Medications on File Prior to Visit  Medication Sig Dispense Refill   alendronate  (FOSAMAX ) 70 MG tablet Take 1 tablet (70 mg total) by mouth every 7 (seven) days Take with a full glass of water . Do not lie down for the next 30 min. 12 tablet 3   calcium carbonate/vitamin D3 (CALCIUM 600 + D,3, ORAL) once       cholecalciferol (VITAMIN D3) 1,000 unit tablet Take by mouth       cyclobenzaprine  (FLEXERIL ) 5 MG tablet TAKE 1 TABLET AT BEDTIME AS NEEDED FOR MUSCLE SPASMS 90 tablet 3   estrogen, conjugated,-medroxyprogesterone (PREMPRO ) 0.3-1.5 mg tablet Take 1 tablet by mouth once daily. 3 Package 3   ibuprofen  (MOTRIN ) 800 MG tablet Take by mouth       multivitamin capsule Take 1 capsule by mouth once daily       pantoprazole  (PROTONIX ) 20 MG DR  tablet Take 20 mg by mouth once daily       tazarotene (TAZORAC) 0.1 % cream         traMADoL  (ULTRAM ) 50 mg tablet TAKE 1 TABLET EVERY 8 HOURS AS NEEDED FOR PAIN 270 tablet 1   cycloSPORINE (RESTASIS) 0.05 % ophthalmic emulsion Place 1 drop into both eyes 2 (two) times daily (Patient not taking: Reported on 05/04/2024)       docosahexanoic acid 100 mg Cap TAKE 2 EACH BY MOUTH DAILY. (Patient not taking: Reported on 05/04/2024)       famotidine  (PEPCID ) 20 MG tablet Take 1 tablet (20 mg total) by mouth 2 (two) times daily 180 tablet 3   famotidine  (PEPCID ) 40 MG tablet Take by mouth (Patient not taking: Reported on 08/22/2023)        No current facility-administered medications on file prior to visit.        Allergies: Allergies       Allergies  Allergen Reactions   Omeprazole  Rash      And headache        SocHx: Social History          Tobacco Use   Smoking status: Never   Smokeless tobacco: Never  Vaping Use   Vaping status: Never Used  Substance Use Topics   Alcohol  use: No      Comment: 4 glasses of wine a year   Drug use: No          OBJECTIVE: BP 106/63 (BP Location: Left upper arm, Patient Position: Sitting)   Pulse 65   Ht 160 cm (5' 3)   Wt 51.8 kg (114 lb 3.2 oz)   LMP  (LMP Unknown)   BMI 20.23 kg/m    Gen: NAD HEENT: August/AT Breasts: breasts appear normal, no suspicious masses, no skin or nipple changes or axillary nodes. Heart: Regular rate Lungs: Normal work of breathing Abdomen: soft, nontender, nondistended Ext: No BLE edema Pelvic exam: Waddell Burkes, LPN present as chaperone Normal external female genitalia without lesions;  Normal bartholins/skenes glands, normal urethral meatus;  Vaginal mucosa pale pink and dry with decreased rugae, no discharge or blood in the vault;  Cervix is large and globular with polypoid lesion at  12 o'clock with overlying mosaicism Bimanual exam:     Unable to palpate uterus     No adnexal masses or tenderness      Pelvic ultrasound- 02/25/24 TRANSABDOMINAL AND TRANSVAGINAL ULTRASOUND OF PELVIS   TECHNIQUE:  Both transabdominal and transvaginal ultrasound examinations of the  pelvis were performed. Transabdominal technique was performed for  global imaging of the pelvis including uterus, ovaries, adnexal  regions, and pelvic cul-de-sac. It was necessary to proceed with  endovaginal exam following the transabdominal exam to visualize the  endometrium.   COMPARISON: None Available.   FINDINGS:  Uterus   Measurements: 8.0 x 4.6 x 5.3 cm = volume: 100.9 mL. There is a 1.9  x 2.1 x 2.0 cm fibroid right uterine fundus and a 2.5 x 2.7 x 2.8 cm  fibroid left uterine fundus.   Endometrium   Thickness: 6 mm.  No focal abnormality visualized.   Right ovary   Not visualized.   Left ovary   Measurements: 3.0 x 1.6 x 2.7 cm = volume: 6.7 mL. Within the left  ovary there is a 1.5 cm cyst with possible thin internal septations.   Other findings   No abnormal free fluid.   IMPRESSION:  1. Endometrium measures up to 6 mm. In the setting of  post-menopausal bleeding, endometrial sampling is indicated to  exclude carcinoma. If results are benign, sonohysterogram should be  considered for focal lesion work-up. (Ref: Radiological Reasoning:  Algorithmic Workup of Abnormal Vaginal Bleeding with Endovaginal  Sonography and Sonohysterography. AJR 2008; 808:D31-26)  2. Possible thin septations within the left ovarian cyst. Recommend  follow-up pelvic ultrasound in 6-8 weeks to reassess.    ASSESSMENT/PLAN: SYLVA OVERLEY 78 y.o. [id number] postmenopausal female on HRT with a hx of osteopenia who presents as a referral for postmenopausal bleeding.    #Postmenopausal bleeding #Endometrial polyp? - Since 2019 - Differential: vaginal (atrophy, dysplasia, malignancy), cervical (dysplasia, malignancy), versus uterine (atrophy, fibroids, polyps, hyperplasia, malignancy) - Exam with atrophy and cervical  lesion - Pap 05/04/24 NILM with neg HRHPV - Pelvic ultrasound with slightly enlarged uterus (8.0 x 4.6 x 5.3 cm), 2 small fundal fibroids, and EMS 6 mm - Previous EMB on 09/09/18 showed an endometrioid type polyp in the background of atrophic endometrium- no further evaluation of this - EMB 05/04/24 benign endometrial polyp, scant atrophic endometrium - Given chronic postmenopausal bleeding, recommended proceeding with further evaluation in the OR. Recommended proceeding with hysteroscopy, D&C, and possible Myosure polypectomy . Discussed planned procedure. Patient wants to proceed - Discussed planned procedure, risks, and benefits. Risks such as infection, bleeding, organ damage (including uterine perforation), anesthesia complications, and need for possible blood products were discussed; patient will accept products in case of an emergency. We discussed the risks of a blood transfusion, such as a 1 in 1.2-1.4 million chance of contracting HIV, Hep C. Discussed possibility of needing to perform a laparoscopic procedure in the case of uterine perforation.   - Surgical and blood consent signed - Pre-op orders placed (no antibiotics indicated) - Rx tylenol , ibuprofen , and zofran  PRN. Sent to pharmacy.    #Cervical lesion - Pap 05/04/24 NILM with neg HRHPV - Plan for colpo, cervical biopsies, and ECC in the OR    Javien Tesch DICIE DINSMORE, MD

## 2024-05-18 ENCOUNTER — Encounter
Admission: RE | Admit: 2024-05-18 | Discharge: 2024-05-18 | Disposition: A | Discharge status: Home / Self Care | Source: Ambulatory Visit | Attending: Obstetrics and Gynecology | Admitting: Obstetrics and Gynecology

## 2024-05-18 DIAGNOSIS — Z01810 Encounter for preprocedural cardiovascular examination: Secondary | ICD-10-CM | POA: Diagnosis present

## 2024-05-18 DIAGNOSIS — Z01818 Encounter for other preprocedural examination: Secondary | ICD-10-CM | POA: Diagnosis not present

## 2024-05-18 DIAGNOSIS — R9431 Abnormal electrocardiogram [ECG] [EKG]: Secondary | ICD-10-CM | POA: Insufficient documentation

## 2024-05-18 DIAGNOSIS — Z01812 Encounter for preprocedural laboratory examination: Secondary | ICD-10-CM | POA: Diagnosis present

## 2024-05-18 LAB — CBC
HCT: 34.8 % — ABNORMAL LOW (ref 36.0–46.0)
Hemoglobin: 11.7 g/dL — ABNORMAL LOW (ref 12.0–15.0)
MCH: 31 pg (ref 26.0–34.0)
MCHC: 33.6 g/dL (ref 30.0–36.0)
MCV: 92.1 fL (ref 80.0–100.0)
Platelets: 292 K/uL (ref 150–400)
RBC: 3.78 MIL/uL — ABNORMAL LOW (ref 3.87–5.11)
RDW: 12 % (ref 11.5–15.5)
WBC: 6.3 K/uL (ref 4.0–10.5)
nRBC: 0 % (ref 0.0–0.2)

## 2024-05-18 LAB — TYPE AND SCREEN
ABO/RH(D): B POS
Antibody Screen: NEGATIVE

## 2024-05-21 ENCOUNTER — Other Ambulatory Visit: Payer: Self-pay

## 2024-05-21 ENCOUNTER — Encounter: Payer: Self-pay | Admitting: Obstetrics and Gynecology

## 2024-05-21 ENCOUNTER — Ambulatory Visit
Admission: RE | Admit: 2024-05-21 | Discharge: 2024-05-21 | Disposition: A | Discharge status: Home / Self Care | Attending: Obstetrics and Gynecology | Admitting: Obstetrics and Gynecology

## 2024-05-21 ENCOUNTER — Ambulatory Visit: Admitting: Certified Registered"

## 2024-05-21 ENCOUNTER — Encounter
Admission: RE | Disposition: A | Discharge status: Home / Self Care | Payer: Self-pay | Source: Home / Self Care | Attending: Obstetrics and Gynecology

## 2024-05-21 DIAGNOSIS — N841 Polyp of cervix uteri: Secondary | ICD-10-CM | POA: Insufficient documentation

## 2024-05-21 DIAGNOSIS — N950 Postmenopausal bleeding: Secondary | ICD-10-CM | POA: Diagnosis present

## 2024-05-21 DIAGNOSIS — N840 Polyp of corpus uteri: Secondary | ICD-10-CM | POA: Diagnosis not present

## 2024-05-21 DIAGNOSIS — Z79890 Hormone replacement therapy (postmenopausal): Secondary | ICD-10-CM | POA: Insufficient documentation

## 2024-05-21 DIAGNOSIS — Z01818 Encounter for other preprocedural examination: Secondary | ICD-10-CM

## 2024-05-21 DIAGNOSIS — M8580 Other specified disorders of bone density and structure, unspecified site: Secondary | ICD-10-CM | POA: Diagnosis not present

## 2024-05-21 HISTORY — PX: DILATATION & CURRETTAGE/HYSTEROSCOPY WITH RESECTOCOPE: SHX5572

## 2024-05-21 HISTORY — PX: MYOSURE RESECTION: SHX7611

## 2024-05-21 HISTORY — PX: COLPOSCOPY: SHX161

## 2024-05-21 HISTORY — PX: CERVICAL POLYPECTOMY: SHX88

## 2024-05-21 LAB — ABO/RH: ABO/RH(D): B POS

## 2024-05-21 SURGERY — DILATATION & CURETTAGE/HYSTEROSCOPY WITH RESECTOCOPE
Anesthesia: General | Site: Cervix

## 2024-05-21 MED ORDER — ONDANSETRON HCL 4 MG/2ML IJ SOLN
INTRAMUSCULAR | Status: AC
  Filled 2024-05-21: qty 2

## 2024-05-21 MED ORDER — MONSELS FERRIC SUBSULFATE EX SOLN
CUTANEOUS | Status: AC
Start: 2024-05-21 — End: 2024-05-21
  Filled 2024-05-21: qty 8

## 2024-05-21 MED ORDER — DROPERIDOL 2.5 MG/ML IJ SOLN: 0.6250 mg | Freq: Once | INTRAMUSCULAR | Status: DC | PRN

## 2024-05-21 MED ORDER — LIDOCAINE HCL (PF) 2 % IJ SOLN
INTRAMUSCULAR | Status: AC
  Filled 2024-05-21: qty 5

## 2024-05-21 MED ORDER — 0.9 % SODIUM CHLORIDE (POUR BTL) OPTIME
TOPICAL | Status: DC | PRN
  Administered 2024-05-21: 500 mL

## 2024-05-21 MED ORDER — FENTANYL CITRATE (PF) 100 MCG/2ML IJ SOLN
INTRAMUSCULAR | Status: AC
  Filled 2024-05-21: qty 2

## 2024-05-21 MED ORDER — IODINE STRONG (LUGOLS) 5 % PO SOLN
ORAL | Status: DC | PRN
  Administered 2024-05-21: 14 mL

## 2024-05-21 MED ORDER — CHLORHEXIDINE GLUCONATE 0.12 % MT SOLN
15.0000 mL | Freq: Once | OROMUCOSAL | Status: AC
  Administered 2024-05-21: 15 mL via OROMUCOSAL

## 2024-05-21 MED ORDER — PROPOFOL 10 MG/ML IV BOLUS
INTRAVENOUS | Status: AC
  Filled 2024-05-21: qty 20

## 2024-05-21 MED ORDER — POVIDONE-IODINE 10 % EX SWAB
2.0000 | Freq: Once | CUTANEOUS | Status: AC
  Administered 2024-05-21: 2 via TOPICAL

## 2024-05-21 MED ORDER — PROPOFOL 10 MG/ML IV BOLUS
INTRAVENOUS | Status: DC | PRN
  Administered 2024-05-21: 100 mg via INTRAVENOUS

## 2024-05-21 MED ORDER — FENTANYL CITRATE (PF) 100 MCG/2ML IJ SOLN
INTRAMUSCULAR | Status: DC | PRN
  Administered 2024-05-21: 25 ug via INTRAVENOUS

## 2024-05-21 MED ORDER — ACETAMINOPHEN 500 MG PO TABS
ORAL_TABLET | ORAL | Status: AC
  Filled 2024-05-21: qty 2

## 2024-05-21 MED ORDER — PROPOFOL 1000 MG/100ML IV EMUL
INTRAVENOUS | Status: AC
  Filled 2024-05-21: qty 100

## 2024-05-21 MED ORDER — EPHEDRINE SULFATE-NACL 50-0.9 MG/10ML-% IV SOSY
PREFILLED_SYRINGE | INTRAVENOUS | Status: DC | PRN
  Administered 2024-05-21: 10 mg via INTRAVENOUS

## 2024-05-21 MED ORDER — MONSELS FERRIC SUBSULFATE EX SOLN
CUTANEOUS | Status: DC | PRN
  Administered 2024-05-21: 1 via TOPICAL

## 2024-05-21 MED ORDER — OXYCODONE HCL 5 MG PO TABS: 5.0000 mg | ORAL_TABLET | Freq: Once | ORAL | Status: DC | PRN

## 2024-05-21 MED ORDER — CHLORHEXIDINE GLUCONATE 0.12 % MT SOLN
OROMUCOSAL | Status: AC
  Filled 2024-05-21: qty 15

## 2024-05-21 MED ORDER — ONDANSETRON HCL 4 MG/2ML IJ SOLN
INTRAMUSCULAR | Status: DC | PRN
  Administered 2024-05-21: 4 mg via INTRAVENOUS

## 2024-05-21 MED ORDER — ACETAMINOPHEN 10 MG/ML IV SOLN: 1000.0000 mg | Freq: Once | INTRAVENOUS | Status: DC | PRN

## 2024-05-21 MED ORDER — DEXAMETHASONE SODIUM PHOSPHATE 10 MG/ML IJ SOLN
INTRAMUSCULAR | Status: DC | PRN
  Administered 2024-05-21: 4 mg via INTRAVENOUS

## 2024-05-21 MED ORDER — LIDOCAINE-EPINEPHRINE 1 %-1:100000 IJ SOLN
INTRAMUSCULAR | Status: AC
Start: 2024-05-21 — End: 2024-05-21
  Filled 2024-05-21: qty 1

## 2024-05-21 MED ORDER — ORAL CARE MOUTH RINSE: 15.0000 mL | Freq: Once | OROMUCOSAL | Status: AC

## 2024-05-21 MED ORDER — IODINE STRONG (LUGOLS) 5 % PO SOLN
ORAL | Status: AC
  Filled 2024-05-21: qty 1

## 2024-05-21 MED ORDER — ACETIC ACID 3 % SOLN
Status: AC
Start: 2024-05-21 — End: 2024-05-21
  Filled 2024-05-21: qty 500

## 2024-05-21 MED ORDER — OXYCODONE HCL 5 MG/5ML PO SOLN: 5.0000 mg | Freq: Once | ORAL | Status: DC | PRN

## 2024-05-21 MED ORDER — FENTANYL CITRATE (PF) 100 MCG/2ML IJ SOLN: 25.0000 ug | INTRAMUSCULAR | Status: DC | PRN

## 2024-05-21 MED ORDER — SODIUM CHLORIDE 0.9 % IR SOLN
Status: DC | PRN
  Administered 2024-05-21: 3000 mL

## 2024-05-21 MED ORDER — SODIUM CHLORIDE 0.9 % IV SOLN: INTRAVENOUS | Status: DC

## 2024-05-21 MED ORDER — ACETAMINOPHEN 500 MG PO TABS
1000.0000 mg | ORAL_TABLET | ORAL | Status: AC
  Administered 2024-05-21: 1000 mg via ORAL

## 2024-05-21 MED ORDER — ACETIC ACID 3 % SOLN
Status: DC | PRN
  Administered 2024-05-21: 1 via TOPICAL

## 2024-05-21 MED ORDER — CEFAZOLIN SODIUM-DEXTROSE 2-4 GM/100ML-% IV SOLN
INTRAVENOUS | Status: AC
  Filled 2024-05-21: qty 100

## 2024-05-21 MED ORDER — DEXAMETHASONE SODIUM PHOSPHATE 10 MG/ML IJ SOLN
INTRAMUSCULAR | Status: AC
  Filled 2024-05-21: qty 1

## 2024-05-21 MED ORDER — LIDOCAINE-EPINEPHRINE 1 %-1:100000 IJ SOLN
INTRAMUSCULAR | Status: DC | PRN
  Administered 2024-05-21: 20 mL

## 2024-05-21 MED ORDER — LIDOCAINE HCL (CARDIAC) PF 100 MG/5ML IV SOSY
PREFILLED_SYRINGE | INTRAVENOUS | Status: DC | PRN
Start: 2024-05-21 — End: 2024-05-21
  Administered 2024-05-21: 40 mg via INTRAVENOUS

## 2024-05-21 MED ORDER — EPHEDRINE 5 MG/ML INJ
INTRAVENOUS | Status: AC
  Filled 2024-05-21: qty 5

## 2024-05-21 MED ORDER — LACTATED RINGERS IV SOLN: INTRAVENOUS | Status: DC

## 2024-05-21 MED ORDER — PROPOFOL 500 MG/50ML IV EMUL
INTRAVENOUS | Status: DC | PRN
Start: 2024-05-21 — End: 2024-05-21
  Administered 2024-05-21: 175 ug/kg/min via INTRAVENOUS

## 2024-05-21 SURGICAL SUPPLY — 33 items
APPLICATOR COTTON TIP 6 STRL (MISCELLANEOUS) ×1 IMPLANT
APPLICATOR SWAB PROCTO LG 16IN (MISCELLANEOUS) ×1 IMPLANT
CATH ROBINSON RED A/P 16FR (CATHETERS) ×1 IMPLANT
CNTNR URN SCR LID CUP LEK RST (MISCELLANEOUS) ×1 IMPLANT
DEVICE MYOSURE LITE (MISCELLANEOUS) IMPLANT
DEVICE MYOSURE REACH (MISCELLANEOUS) IMPLANT
DRSG TELFA 3X8 NADH STRL (GAUZE/BANDAGES/DRESSINGS) IMPLANT
ELECTRODE LEEP BALL 5MM 12CM (MISCELLANEOUS) IMPLANT
ELECTRODE LOOP 1.0X1.0CM R1010 (MISCELLANEOUS) IMPLANT
ELECTRODE REM PT RTRN 9FT ADLT (ELECTROSURGICAL) ×1 IMPLANT
GAUZE 4X4 16PLY ~~LOC~~+RFID DBL (SPONGE) ×1 IMPLANT
GLOVE SURG SYN 6.5 PF PI (GLOVE) ×2 IMPLANT
GOWN STRL REUS W/ TWL LRG LVL3 (GOWN DISPOSABLE) ×1 IMPLANT
GOWN STRL REUS W/ TWL XL LVL3 (GOWN DISPOSABLE) IMPLANT
KIT PROCEDURE FLUENT (KITS) IMPLANT
KIT TURNOVER CYSTO (KITS) ×1 IMPLANT
MANIFOLD NEPTUNE II (INSTRUMENTS) ×1 IMPLANT
NDL HYPO 30X.5 LL (NEEDLE) IMPLANT
NDL SPNL 20GX3.5 QUINCKE YW (NEEDLE) IMPLANT
NEEDLE HYPO 30X.5 LL (NEEDLE) IMPLANT
NEEDLE SPNL 20GX3.5 QUINCKE YW (NEEDLE) ×1 IMPLANT
NS IRRIG 500ML POUR BTL (IV SOLUTION) IMPLANT
PACK DNC HYST (MISCELLANEOUS) IMPLANT
PAD OB MATERNITY 11 LF (PERSONAL CARE ITEMS) ×1 IMPLANT
PAD PREP OB/GYN DISP 24X41 (PERSONAL CARE ITEMS) ×1 IMPLANT
PENCIL SMOKE EVACUATOR (MISCELLANEOUS) IMPLANT
SCRUB CHG 4% DYNA-HEX 4OZ (MISCELLANEOUS) ×1 IMPLANT
SEAL ROD LENS SCOPE MYOSURE (ABLATOR) ×1 IMPLANT
SOL .9 NS 3000ML IRR UROMATIC (IV SOLUTION) ×1 IMPLANT
SYR CONTROL 10ML LL (SYRINGE) ×1 IMPLANT
TOWEL OR 17X26 4PK STRL BLUE (TOWEL DISPOSABLE) ×1 IMPLANT
TRAP FLUID SMOKE EVACUATOR (MISCELLANEOUS) ×1 IMPLANT
TUBING CONNECTING 10 (TUBING) IMPLANT

## 2024-05-21 NOTE — Anesthesia Preprocedure Evaluation (Addendum)
 Anesthesia Evaluation  Patient identified by MRN, date of birth, ID band Patient awake    Reviewed: Allergy & Precautions, H&P , NPO status , Patient's Chart, lab work & pertinent test results, reviewed documented beta blocker date and time   History of Anesthesia Complications (+) PONV and history of anesthetic complications  Airway Mallampati: II  TM Distance: >3 FB Neck ROM: full    Dental  (+) Teeth Intact   Pulmonary neg pulmonary ROS   Pulmonary exam normal        Cardiovascular Exercise Tolerance: Good + CAD  Normal cardiovascular exam Rate:Normal     Neuro/Psych  Headaches  Neuromuscular disease  negative psych ROS   GI/Hepatic Neg liver ROS,GERD  ,,  Endo/Other  negative endocrine ROS    Renal/GU negative Renal ROS  negative genitourinary   Musculoskeletal   Abdominal   Peds  Hematology negative hematology ROS (+)   Anesthesia Other Findings   Reproductive/Obstetrics negative OB ROS                              Anesthesia Physical Anesthesia Plan  ASA: 3  Anesthesia Plan: General LMA   Post-op Pain Management:    Induction:   PONV Risk Score and Plan:   Airway Management Planned:   Additional Equipment:   Intra-op Plan:   Post-operative Plan:   Informed Consent: I have reviewed the patients History and Physical, chart, labs and discussed the procedure including the risks, benefits and alternatives for the proposed anesthesia with the patient or authorized representative who has indicated his/her understanding and acceptance.       Plan Discussed with: CRNA  Anesthesia Plan Comments:         Anesthesia Quick Evaluation

## 2024-05-21 NOTE — Anesthesia Procedure Notes (Signed)
 Procedure Name: LMA Insertion Date/Time: 05/21/2024 7:45 AM  Performed by: Brandy Almarie BROCKS, CRNAPre-anesthesia Checklist: Patient identified, Emergency Drugs available, Suction available and Patient being monitored Patient Re-evaluated:Patient Re-evaluated prior to induction Oxygen Delivery Method: Circle system utilized Preoxygenation: Pre-oxygenation with 100% oxygen Induction Type: IV induction LMA: LMA inserted LMA Size: 4.0 Number of attempts: 1 Dental Injury: Teeth and Oropharynx as per pre-operative assessment

## 2024-05-21 NOTE — Transfer of Care (Signed)
 Immediate Anesthesia Transfer of Care Note  Patient: Alexis Medina  Procedure(s) Performed: DILATATION & CURETTAGE/HYSTEROSCOPY WITH RESECTOCOPE (Cervix) MYOSURE RESECTION (Cervix) COLPOSCOPY (Cervix) POLYPECTOMY, CERVIX (Cervix)  Patient Location: PACU  Anesthesia Type:General  Level of Consciousness: drowsy  Airway & Oxygen Therapy: Patient Spontanous Breathing and Patient connected to face mask oxygen  Post-op Assessment: Report given to RN, Post -op Vital signs reviewed and stable, and Patient moving all extremities X 4  Post vital signs: Reviewed and stable  Last Vitals:  Vitals Value Taken Time  BP 110/55 05/21/24 09:05  Temp    Pulse 76 05/21/24 09:07  Resp 17 05/21/24 09:07  SpO2 100 % 05/21/24 09:07  Vitals shown include unfiled device data.  Last Pain:  Vitals:   05/21/24 0629  TempSrc: Oral  PainSc: 0-No pain         Complications: No notable events documented.

## 2024-05-21 NOTE — Op Note (Signed)
 Operative Note   Patient name: Alexis Medina MRN: [id number] Surgery Date: 05/21/2024  Preoperative diagnosis: Postmenopausal bleeding, endometrial polyp, cervical polyp Postoperative diagnosis: Same   Procedure: Hysteroscopy, Dilation & Curettage, Myosure polypectomy, colposcopy, cervical biopsies, endocervical curettage, and cervical polyp removal Surgeon: Alexis Dinsmore, MD Anesthesia: General   Fluids: EBL: 10cc  IVF: 700cc UOP: 20cc  Fluid deficit: 265cc   Complications: none  Specimens: endometrial and endocervical polyps, endometrial curettings, endocervical curettings, cervical biopsies (3, 6, 9, and 12 o'clock), and cervical polyp Findings: Anteverted uterus that was sounded to 5.5cm. Vaginal trophy. 1 cm cervical polyp at 12 o'clock involving both ectocervix and endocervix. Mild acetowhite change over cervical polyp at 12 o'clock with overlying mosaicism (Visually CIN1). No punctation or abnormal vasculature. Atrophic endocervial canal with polyp. Atrophic endometrium with fundal endometrial polyp. Proliferative irregular endometrium with multiple small polyps. Bilateral ostia not clearly visualized due to scarring and atrophy. Polyps removed with Mysoure. Endometrial curettings obtained with sharp curettage.    Procedure:  The risks, benefits, indications, and alternative of the procedure were reviewed with the patient in pre-operative area and informed consent was obtained. The patient was taken to the operating room with IV in place. General anesthesia was administered. She was placed in the dorsal lithotomy position, prepped and draped in the usual sterile fashion. A red rubber catheter was used to drain the bladder. A sterile speculum was placed in the patient's vagina and the cervix visualized. A single tooth tenaculum was placed on the anterior lip of the cervix. Paracervical block performed by injecting a total of 20cc of 1% lidocaine  with epinephrine  into the vaginal  fornices at 4 and 8 o'clock (care was taken to pull back prior to injection). The uterus was then gently sounded to 5.5 cm. The cervix was dilated to allow for hysteroscope using Pratt dilators. The hysteroscopy was done with normal saline with the above findings were noted. The Myosure was used to remove the polyps. The hysteroscopy was then removed. Next, an endocervical curettage was performed. Then, a sharp endometrial curettage was then performed until a gritty texture was noted. Tenaculum was removed and hemostasis was noted. The cervix was swabbed with acetic acid and inspected using the colposcope. The cervix was then swabbed with Lugol's and inspected using the colposcope. Above findings noted. Green Horticulturist, commercial was used. Cervical biopsies taken from each quadrant. Electosurgical loop (10x51mm) was used to excise the cervical polyp. The excised base was cauterized with the Bovie rollerball and hemostasis was confirmed. Monsel's was applied. Hemostasis noted.  Specimens were sent to pathology. Vaginal instruments were removed. Hemostasis noted. The patient tolerated the procedure well and was taken to the recovery room in stable condition.   Alexis LULLA Dinsmore, MD 05/21/2024 8:56 AM

## 2024-05-21 NOTE — Interval H&P Note (Signed)
 History and Physical Interval Note:  05/21/2024 7:34 AM  Alexis Medina  has presented today for surgery, with the diagnosis of postmenopausal bleeding, cervical lesion.  The various methods of treatment have been discussed with the patient and family. After consideration of risks, benefits and other options for treatment, the patient has consented to  Procedure(s) with comments: DILATATION & CURETTAGE/HYSTEROSCOPY WITH RESECTOCOPE (N/A) MYOSURE RESECTION (N/A) COLPOSCOPY (N/A) - CERVICAL BIOPSIES as a surgical intervention.  The patient's history has been reviewed, patient examined, no change in status, stable for surgery.  I have reviewed the patient's chart and labs.  Questions were answered to the patient's satisfaction.     Mickie Badders V Hetal Proano

## 2024-05-24 ENCOUNTER — Encounter: Payer: Self-pay | Admitting: Obstetrics and Gynecology

## 2024-05-25 LAB — SURGICAL PATHOLOGY

## 2024-05-25 NOTE — Anesthesia Postprocedure Evaluation (Signed)
 Anesthesia Post Note  Patient: Alexis Medina  Procedure(s) Performed: DILATATION & CURETTAGE/HYSTEROSCOPY WITH RESECTOCOPE (Cervix) MYOSURE RESECTION (Cervix) COLPOSCOPY (Cervix) POLYPECTOMY, CERVIX (Cervix)  Patient location during evaluation: PACU Anesthesia Type: General Level of consciousness: awake and alert Pain management: pain level controlled Vital Signs Assessment: post-procedure vital signs reviewed and stable Respiratory status: spontaneous breathing, nonlabored ventilation, respiratory function stable and patient connected to nasal cannula oxygen Cardiovascular status: blood pressure returned to baseline and stable Postop Assessment: no apparent nausea or vomiting Anesthetic complications: no   No notable events documented.   Last Vitals:  Vitals:   05/21/24 0945 05/21/24 1007  BP: 102/61 127/66  Pulse: 64 (!) 59  Resp: 18 14  Temp: (!) 36.1 C (!) 36.2 C  SpO2: 100% 99%    Last Pain:  Vitals:   05/21/24 0930  TempSrc:   PainSc: Asleep                 Lynwood KANDICE Clause

## 2024-06-01 ENCOUNTER — Ambulatory Visit: Payer: Self-pay | Admitting: Obstetrics and Gynecology

## 2024-06-17 ENCOUNTER — Encounter (HOSPITAL_COMMUNITY): Payer: Self-pay

## 2024-06-17 ENCOUNTER — Ambulatory Visit (HOSPITAL_COMMUNITY)
Admission: EM | Admit: 2024-06-17 | Discharge: 2024-06-17 | Disposition: A | Discharge status: Home / Self Care | Attending: Nurse Practitioner | Admitting: Nurse Practitioner

## 2024-06-17 DIAGNOSIS — R051 Acute cough: Secondary | ICD-10-CM

## 2024-06-17 DIAGNOSIS — U071 COVID-19: Secondary | ICD-10-CM

## 2024-06-17 LAB — POC SARS CORONAVIRUS 2 AG -  ED: SARS Coronavirus 2 Ag: POSITIVE — AB

## 2024-06-17 MED ORDER — PAXLOVID (300/100) 20 X 150 MG & 10 X 100MG PO TBPK: 3.0000 | ORAL_TABLET | Freq: Two times a day (BID) | ORAL | 0 refills | Status: AC

## 2024-06-17 NOTE — ED Triage Notes (Signed)
 Chief Complaint: dry cough, fever (103 F), sore throat, headache, and clear runny nose.   Sick exposure: No  Onset: 2 days   Prescriptions or OTC medications tried: Yes- tylenol  cold and sinus, vitamin C   with moderate relief  New foods, medications, or products: No  Recent Travel: No

## 2024-06-17 NOTE — ED Provider Notes (Signed)
 [facility]    CSN: [id number] Arrival date & time: 06/17/24  1644      History   Chief Complaint Chief Complaint  Patient presents with   Cough    HPI Alexis Medina is a 78 y.o. female.   Discussed the use of AI scribe software for clinical note transcription with the patient, who gave verbal consent to proceed.   The patient presents with a cough that started Tuesday night, accompanied by fever, runny nose, chest pain, sneezing, sore throat, nausea, and body aches. The cough worsened yesterday. The patient reports chest pain due to coughing. She experienced a high fever yesterday, which has since decreased but continues to fluctuate. The highest recorded temperature was 103F last night, accompanied by a severe headache. This morning, the temperature was 24F, then rose to 101F. The patient has a clear runny nose and sneezing. The sore throat, while still present, has improved slightly. She experienced nausea yesterday but did not vomit. Body aches are also reported. The patient denies any shortness of breath, wheezing, ear discomfort, or diarrhea. She has been taking Tylenol  cold and sinus and vitamin C for symptom relief. The patient reports being around many people on Monday but is not aware of any known sick contacts. She denies any history of heart or lung disease, cholesterol issues, or kidney problems.   The following portions of the patient's history were reviewed and updated as appropriate: allergies, current medications, past family history, past medical history, past social history, past surgical history, and problem list.    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   Spinal stenosis of lumbar region 06/17/2022   Pain in right hand 01/02/2022   Dupuytren's contracture 10/20/2021   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   Precordial pain 03/16/2021   Coronary artery calcification 03/16/2021   Aortic atherosclerosis (HCC) 03/16/2021   Pure hypercholesterolemia 03/16/2021   Dizziness 03/08/2019   Post concussion syndrome 03/08/2019   Headache disorder 03/08/2019   DDD (degenerative disc disease), 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/02/2018   Menopausal syndrome on hormone replacement therapy 04/18/2015   History of postmenopausal bleeding 04/18/2015    Past Surgical History:  Procedure Laterality Date   CERVICAL POLYPECTOMY N/A 05/21/2024   Procedure: POLYPECTOMY, CERVIX;  Surgeon: Lovetta Beverli GAILS, MD;  Location: [facility];  Service: Gynecology;  Laterality: N/A;   COLPOSCOPY N/A 05/21/2024   Procedure: COLPOSCOPY;  Surgeon: Schermerhorn, Beverli GAILS, MD;  Location: [facility];  Service: Gynecology;  Laterality: N/A;  CERVICAL BIOPSIES   DILATATION & CURRETTAGE/HYSTEROSCOPY WITH RESECTOCOPE N/A 05/21/2024   Procedure: DILATATION & CURETTAGE/HYSTEROSCOPY WITH RESECTOCOPE;  Surgeon: Schermerhorn, Beverli GAILS, MD;  Location: [facility];  Service: Gynecology;  Laterality: N/A;  Endocervical curettage   EYE SURGERY     cararacts both eyes   LESION REMOVAL N/A 09/08/2023   Procedure: EXCISION OF CHEST WALL LESION;  Surgeon: Polly Cordella LABOR, MD;  Location: [facility] [place];  Service: General;  Laterality: N/A;   LUMBAR DISC  SURGERY     MYOSURE RESECTION N/A 05/21/2024   Procedure: MELINDA RESECTION;  Surgeon: Lovetta Beverli GAILS, MD;  Location: [facility];  Service: Gynecology;  Laterality: N/A;   TONSILLECTOMY     TUBAL LIGATION     UPPER GI ENDOSCOPY      OB History      Gravida  2   Para  1   Term  1   Preterm      AB  1   Living  1      SAB  1   IAB      Ectopic      Multiple      Live Births  1            Home Medications    Prior to Admission medications   Medication Sig Start Date End Date Taking? Authorizing Provider  alendronate  (FOSAMAX ) 70 MG tablet Take 70 mg by mouth once a week. Take with a full glass of water  on an empty stomach.   Yes [provider]  Calcium Carb-Cholecalciferol (CALCIUM 600+D3) 600-20 MG-MCG TABS Take 1 tablet by mouth daily.   Yes [provider]  cholecalciferol (VITAMIN D) 1000 UNITS tablet Take 1,000 Units by mouth daily.   Yes [provider]  Docosahexaenoic Acid (DHA OMEGA 3) 100 MG CAPS TAKE 2 EACH BY MOUTH DAILY. Patient taking differently: Take 1 capsule by mouth daily. 07/01/18  Yes Bertrum Charlie CROME, MD  Fluocinolone Acetonide 0.01 % OIL Apply 1 Application topically in the morning and at bedtime. 04/06/24  Yes [provider]  Multiple Vitamin (MULTIVITAMIN) capsule Take 1 capsule by mouth daily.   Yes [provider]  nirmatrelvir/ritonavir (PAXLOVID , 300/100,) 20 x 150 MG & 10 x 100MG  TBPK Take 3 tablets by mouth 2 (two) times daily for 5 days. Take nirmatrelvir (150 mg) two tablets twice daily for 5 days and ritonavir (100 mg) one tablet twice daily for 5 days. 06/17/24 06/22/24 Yes Prabhnoor Ellenberger, FNP  pantoprazole  (PROTONIX ) 20 MG tablet TAKE 1 TABLET BY MOUTH EVERY DAY 01/09/24  Yes Armbruster, Elspeth SQUIBB, MD  RESTASIS 0.05 % ophthalmic emulsion Place 1 drop into both eyes 2 (two) times daily. 10/18/20  Yes [provider]  tazarotene (AVAGE) 0.1 % cream Apply 1 application  topically at bedtime. 03/08/16  Yes [provider]  traMADol  (ULTRAM ) 50 MG tablet Take 1 tablet (50 mg total) by mouth every 6 (six) hours as needed. Patient taking differently: Take 50 mg by mouth 2 (two) times daily. 06/06/22  Yes Bertrum Charlie CROME, MD     Family History Family History  Problem Relation Age of Onset   Alzheimer's disease Mother    Heart failure Mother    Heart failure Father    Breast cancer Cousin    Heart disease Brother    Hypertension Brother    Stroke Brother    Atrial fibrillation Brother    Healthy Son    Colon cancer Neg Hx    Colon polyps Neg Hx    Esophageal cancer Neg Hx    Rectal cancer Neg Hx    Stomach cancer Neg Hx     Social History Social History   Tobacco Use   Smoking status: Never   Smokeless tobacco: Never  Vaping Use   Vaping status: Never Used  Substance Use Topics   Alcohol  use: Not Currently    Comment: glass of wine once a year   Drug use: No  Allergies   Omeprazole    Review of Systems Review of Systems  Constitutional:  Positive for chills and fever (TMAX 103). Negative for diaphoresis.  HENT:  Positive for rhinorrhea, sneezing and sore throat.   Respiratory:  Positive for cough.   Gastrointestinal:  Positive for nausea. Negative for diarrhea and vomiting.  Musculoskeletal:  Positive for myalgias.  Neurological:  Positive for headaches.  All other systems reviewed and are negative.    Physical Exam Triage Vital Signs ED Triage Vitals  Encounter Vitals Group     BP 06/17/24 1701 129/77     Girls Systolic BP Percentile --      Girls Diastolic BP Percentile --      Boys Systolic BP Percentile --      Boys Diastolic BP Percentile --      Pulse Rate 06/17/24 1701 90     Resp 06/17/24 1701 18     Temp 06/17/24 1701 98.7 F (37.1 C)     Temp src --      SpO2 06/17/24 1701 94 %     Weight 06/17/24 1700 112 lb (50.8 kg)     Height 06/17/24 1700 5' 3 (1.6 m)     Head Circumference --      Peak Flow --      Pain Score 06/17/24 1659 5     Pain Loc --      Pain Education --      Exclude from Growth Chart --    No data found.  Updated Vital Signs BP 129/77 (BP Location: Right Arm)   Pulse 90   Temp 98.7 F (37.1 C)   Resp 18   Ht 5' 3 (1.6 m)    Wt 112 lb (50.8 kg)   SpO2 94%   BMI 19.84 kg/m   Visual Acuity Right Eye Distance:   Left Eye Distance:   Bilateral Distance:    Right Eye Near:   Left Eye Near:    Bilateral Near:     Physical Exam Vitals reviewed.  Constitutional:      General: She is awake. She is not in acute distress.    Appearance: Normal appearance. She is well-developed and well-groomed. She is not ill-appearing, toxic-appearing or diaphoretic.  HENT:     Head: Normocephalic.     Right Ear: Tympanic membrane, ear canal and external ear normal. No drainage, swelling or tenderness. No middle ear effusion. Tympanic membrane is not erythematous.     Left Ear: Tympanic membrane, ear canal and external ear normal. No drainage, swelling or tenderness.  No middle ear effusion. Tympanic membrane is not erythematous.     Nose: Rhinorrhea present. No congestion. Rhinorrhea is clear.     Mouth/Throat:     Lips: Pink.     Mouth: Mucous membranes are moist.     Pharynx: Oropharynx is clear. Uvula midline. No pharyngeal swelling, oropharyngeal exudate, posterior oropharyngeal erythema or uvula swelling.     Tonsils: No tonsillar exudate or tonsillar abscesses.  Eyes:     General: Vision grossly intact.     Conjunctiva/sclera: Conjunctivae normal.  Cardiovascular:     Rate and Rhythm: Normal rate and regular rhythm.     Heart sounds: Normal heart sounds.  Pulmonary:     Effort: Pulmonary effort is normal. No tachypnea or respiratory distress.     Breath sounds: Normal breath sounds and air entry.     Comments: Respirations even and unlabored Musculoskeletal:        General: Normal  range of motion.     Cervical back: Full passive range of motion without pain, normal range of motion and neck supple.     Right lower leg: No edema.     Left lower leg: No edema.  Lymphadenopathy:     Cervical: No cervical adenopathy.  Skin:    General: Skin is warm and dry.  Neurological:     General: No focal deficit present.      Mental Status: She is alert and oriented to person, place, and time.  Psychiatric:        Behavior: Behavior is cooperative.      UC Treatments / Results  Labs (all labs ordered are listed, but only abnormal results are displayed) Labs Reviewed  POC SARS CORONAVIRUS 2 AG -  ED - Abnormal; Notable for the following components:      Result Value   SARS Coronavirus 2 Ag Positive (*)    All other components within normal limits    EKG   Radiology No results found.  Procedures Procedures (including critical care time)  Medications Ordered in UC Medications - No data to display  Initial Impression / Assessment and Plan / UC Course  I have reviewed the triage vital signs and the nursing notes.  Pertinent labs & imaging results that were available during my care of the patient were reviewed by me and considered in my medical decision making (see chart for details).     Patient presents with acute onset of cough, fever, runny nose, sneezing, sore throat, body aches, and severe headache beginning Tuesday night. Fever peaked at 103F yesterday and has since fluctuated. Chest exam showed clear lung sounds without wheezing, decreased sounds, or shortness of breath. Symptoms are most consistent with a viral respiratory infection, and testing confirmed COVID-19. Given her age, she is at higher risk for complications despite no history of hypertension, heart disease, lung disease, or kidney disease. Paxlovid  was recommended for treatment. Supportive care with Tylenol  for fever and pain, rest, and hydration was also advised. She was instructed to monitor symptoms closely and follow up with her primary care provider if symptoms persist beyond 7-10 days, worsen, or new symptoms develop. Current CDC guidelines were reviewed, including return to normal activities once afebrile for at least 24 hours without fever-reducing medication and overall improvement in symptoms. Emergency precautions were  discussed for worsening shortness of breath, chest pain, confusion, persistent high fever, or other concerning changes.  Today's evaluation has revealed no signs of a dangerous process. Discussed diagnosis with patient and/or guardian. Patient and/or guardian aware of their diagnosis, possible red flag symptoms to watch out for and need for close follow up. Patient and/or guardian understands verbal and written discharge instructions. Patient and/or guardian comfortable with plan and disposition.  Patient and/or guardian has a clear mental status at this time, good insight into illness (after discussion and teaching) and has clear judgment to make decisions regarding their care  Documentation was completed with the aid of voice recognition software. Transcription may contain typographical errors. Final Clinical Impressions(s) / UC Diagnoses   Final diagnoses:  Acute cough  COVID-19     Discharge Instructions      You have tested positive for COVID-19.   COVID is a viral infection, and antibiotics are not used to treat viral illnesses because they are designed to kill bacteria, not viruses.   You have been prescribed a medication called Paxlovid , which is an antiviral used to treat mild to moderate cases of  COVID-19. It is important to take Paxlovid  exactly as prescribed and not to skip any doses. Some people may experience side effects such as a change in taste, diarrhea, high blood pressure, or muscle aches. These side effects are usually mild and go away after the treatment is complete. Take tylenol  and/or ibuprofen  as needed for fevers, headache and body aches. Be sure to drink plenty of fluids and get plenty of rest while you recover.   COVID-19 generally appears to be less severe now compared to earlier stages of the pandemic back in 2020. This is likely due to a combination of factors, including increased population immunity from vaccination and prior infections, as well as the evolution  of the virus towards less virulent strains. While new variants continue to emerge, they generally cause milder illness, especially in individuals with prior immunity.  Because of this, the CDC has updated its isolation guidance for COVID-19 and states that a 5-day isolation period following a positive test result is no longer needed. Under the new guidelines, people will not need to isolate if they are fever-free for at least 24 hours without medication and if their symptoms are mild and improving. You may return to normal activities if your symptoms are overall improving and you have been without a fever for 24 hours without taking fever reducing medications like Tylenol  or ibuprofen .   Monitor your symptoms closely over the next several days. If your symptoms are not improving within a week, follow up with your primary care provider.  Go to the emergency department right away if you develop shortness of breath, chest pain, dizziness, fainting, confusion, severe weakness, or if you are unable to keep fluids down, as these may be signs of more serious illness.      ED Prescriptions     Medication Sig Dispense Auth. Provider   nirmatrelvir/ritonavir (PAXLOVID , 300/100,) 20 x 150 MG & 10 x 100MG  TBPK Take 3 tablets by mouth 2 (two) times daily for 5 days. Take nirmatrelvir (150 mg) two tablets twice daily for 5 days and ritonavir (100 mg) one tablet twice daily for 5 days. 30 tablet Iola Lukes, FNP      PDMP not reviewed this encounter.   Iola Lukes, [place] 06/17/24 [phone]

## 2024-06-17 NOTE — Discharge Instructions (Addendum)
 You have tested positive for COVID-19. COVID is a viral infection, and antibiotics are not used to treat viral illnesses because they are designed to kill bacteria, not viruses.   You have been prescribed a medication called Paxlovid , which is an antiviral used to treat mild to moderate cases of COVID-19. It is important to take Paxlovid  exactly as prescribed and not to skip any doses. Some people may experience side effects such as a change in taste, diarrhea, high blood pressure, or muscle aches. These side effects are usually mild and go away after the treatment is complete. Take tylenol  and/or ibuprofen  as needed for fevers, headache and body aches. Be sure to drink plenty of fluids and get plenty of rest while you recover.   COVID-19 generally appears to be less severe now compared to earlier stages of the pandemic back in 2020. This is likely due to a combination of factors, including increased population immunity from vaccination and prior infections, as well as the evolution of the virus towards less virulent strains. While new variants continue to emerge, they generally cause milder illness, especially in individuals with prior immunity.  Because of this, the CDC has updated its isolation guidance for COVID-19 and states that a 5-day isolation period following a positive test result is no longer needed. Under the new guidelines, people will not need to isolate if they are fever-free for at least 24 hours without medication and if their symptoms are mild and improving. You may return to normal activities if your symptoms are overall improving and you have been without a fever for 24 hours without taking fever reducing medications like Tylenol  or ibuprofen .   Monitor your symptoms closely over the next several days. If your symptoms are not improving within a week, follow up with your primary care provider.  Go to the emergency department right away if you develop shortness of breath, chest pain,  dizziness, fainting, confusion, severe weakness, or if you are unable to keep fluids down, as these may be signs of more serious illness.

## 2024-06-23 ENCOUNTER — Encounter: Payer: Self-pay | Admitting: Gastroenterology

## 2024-06-23 ENCOUNTER — Ambulatory Visit: Admitting: Gastroenterology

## 2024-06-23 VITALS — BP 100/60 | HR 80 | Ht 63.0 in | Wt 110.0 lb

## 2024-06-23 DIAGNOSIS — Z860100 Personal history of colon polyps, unspecified: Secondary | ICD-10-CM

## 2024-06-23 DIAGNOSIS — M810 Age-related osteoporosis without current pathological fracture: Secondary | ICD-10-CM

## 2024-06-23 DIAGNOSIS — K2940 Chronic atrophic gastritis without bleeding: Secondary | ICD-10-CM

## 2024-06-23 DIAGNOSIS — Z79899 Other long term (current) drug therapy: Secondary | ICD-10-CM

## 2024-06-23 DIAGNOSIS — K219 Gastro-esophageal reflux disease without esophagitis: Secondary | ICD-10-CM

## 2024-06-23 NOTE — Progress Notes (Signed)
 HPI :  78 year old female with history of osteoporosis, GERD, here for a follow-up visit for recently diagnosed autoimmune gastritis.  Recall she had an EGD back in January 2022, had some mild chronic gastritis noted at the time, microscopically concerning for evolving autoimmune gastritis, H. pylori was inconclusive at the time.  She was treated after stool antigen test was positive and subsequent treatment confirmed eradication.  She was seen at the end of 2024 with some recurrent reflux and dyspeptic symptoms.  EGD was done in November 2024 which showed atrophic gastritis.  Biopsy showed chronic gastritis, negative for H. pylori.  Serologic testing showed a markedly positive antiparietal cell antibody with negative intrinsic factor antibody.  B12 and iron studies looked okay, no anemia.  For her symptoms she was placed on Protonix  20 mg once daily.  She states this has worked fabulously to control her symptoms and generally doing much better in regards to the symptoms.  She has noted if she misses a dose she can have some breakthrough symptoms that bother her at times, she generally takes it every day.  We discussed long-term plan.  She has no family history of stomach cancer.  She does not use any NSAIDs.  No CKD.  She does have osteoporosis, last DEXA scan in 2022, she is on Fosamax .  She is not sure when her next DEXA scan is scheduled for.  She denies any fractures recently.  Otherwise her most recent colonoscopy was 06/10/2013 which identified one 6 mm hyperplastic polyp removed from the rectum.  No surveillance colonoscopies recommended due to age.   EGD 11/01/2020 by Dr. Leigh: - Esophagogastric landmarks identified.  - Normal esophagus otherwise  - empiric dilation performed to 17mm as above.  - Normal stomach. Biopsied to rule out H pylori.  - Normal duodenal bulb and second portion of the duodenum.   Surgical [P], gastric antrum and gastric body - MILD CHRONIC GASTRITIS  WITHOUT ACTIVITY - NO INTESTINAL METAPLASIA IDENTIFIED - SEE COMMENT The biopsy shows chronic gastritis with a dense lymphoplasmacytic infiltrate. By immunohistochemistry, the lymphoplasmacytic infiltrate is composed of an admixture of B and T cells (CD20, CD3) without aberrant expression of CD10, CD5 or cyclin-D1. The plasma cell population is polytypic by kappa and lambda in situ hybridization. H. pylori immunohistochemistry is inconclusive; there is dot-like positivity which is more commonly seen and treated infection.   Colonoscopy 06/10/2013: One 6 mm hyperplastic polyp removed from the rectum Nonbleeding internal hemorrhoids Diverticulosis in the sigmoid colon - 10 Year recall colonoscopy - HYPERPLASTIC POLYP.  - NEGATIVE FOR DYSPLASIA AND MALIGNANCY.     NM exercise stress test 03/29/2021: Blood pressure demonstrated a hypertensive response to exercise. Horizontal ST segment depression ST segment depression of 1 mm was noted during stress in the II, III, aVF, V4, V5 and V6 leads. The study is normal. This is a low risk study. The left ventricular ejection fraction is normal (55-65%). CT attenuation images show evidence of aortic and coronary calcifications. Exercise duration of 7 minutes with hypertensive response to exercise and positive EKG changes. Suspect likely false positive given normal perfusion.   Chest CT 11/23/2020: FINDINGS: Cardiovascular: Scattered aortic atherosclerosis. Normal heart size. Three-vessel coronary artery calcifications. No pericardial effusion.   Mediastinum/Nodes: No enlarged mediastinal, hilar, or axillary lymph nodes. Thyroid  gland, trachea, and esophagus demonstrate no significant findings.   Lungs/Pleura: Partially calcified, definitively benign subpleural nodule of the posterior right upper lobe (series 5, image 42). No pleural effusion or pneumothorax.   Upper  Abdomen: No acute abnormality.   Musculoskeletal: No chest wall mass or  suspicious bone lesions identified.   IMPRESSION: 1. No mass or other abnormality of the sternum or overlying anterior soft tissues. 2. Coronary artery disease.  Aortic Atherosclerosis     EGD 09/04/2023: - Esophagogastric landmarks were identified: the Z-line was found at 35 cm, the gastroesophageal junction was found at 35 cm and the upper extent of the gastric folds was found at 35 cm from the incisors. Findings: - The exam of the esophagus was otherwise normal. - Diffuse mildly atrophic mucosa was found in the entire examined stomach. Biopsies were taken with a cold forceps for histology. - The exam of the stomach was otherwise normal. - The examined duodenum was normal.  FINAL DIAGNOSIS        1. Surgical [P], gastric antrum and gastric body :       - MILD CHRONIC GASTRITIS.       - NEGATIVE FOR H. PYLORI ON H&E STAIN       - NO INTESTINAL METAPLASIA, DYSPLASIA, OR MALIGNANCY.   Trial omep 20mg   day  Antiparietal cell AB strongly positive IF antibody negative  B!2 and iron okay  Past Medical History:  Diagnosis Date   Arthritis    Autoimmune gastritis    Cataract    Concussion 2020   COVID    GERD (gastroesophageal reflux disease)    History of Helicobacter pylori infection    Hormone replacement therapy    Hyperlipidemia    Osteopenia    Osteoporosis    PONV (postoperative nausea and vomiting)      Past Surgical History:  Procedure Laterality Date   CERVICAL POLYPECTOMY N/A 05/21/2024   Procedure: POLYPECTOMY, CERVIX;  Surgeon: Lovetta Beverli GAILS, MD;  Location: [facility];  Service: Gynecology;  Laterality: N/A;   COLPOSCOPY N/A 05/21/2024   Procedure: COLPOSCOPY;  Surgeon: Schermerhorn, Beverli GAILS, MD;  Location: [facility];  Service: Gynecology;  Laterality: N/A;  CERVICAL BIOPSIES   DILATATION & CURRETTAGE/HYSTEROSCOPY WITH RESECTOCOPE N/A 05/21/2024   Procedure: DILATATION & CURETTAGE/HYSTEROSCOPY WITH RESECTOCOPE;  Surgeon: Schermerhorn, Beverli GAILS, MD;   Location: [facility];  Service: Gynecology;  Laterality: N/A;  Endocervical curettage   EYE SURGERY     cararacts both eyes   LESION REMOVAL N/A 09/08/2023   Procedure: EXCISION OF CHEST WALL LESION;  Surgeon: Polly Cordella LABOR, MD;  Location: [facility];  Service: General;  Laterality: N/A;   LUMBAR DISC SURGERY     MYOSURE RESECTION N/A 05/21/2024   Procedure: MELINDA RESECTION;  Surgeon: Lovetta Beverli GAILS, MD;  Location: [facility];  Service: Gynecology;  Laterality: N/A;   TONSILLECTOMY     TUBAL LIGATION     UPPER GI ENDOSCOPY     Family History  Problem Relation Age of Onset   Alzheimer's disease Mother    Heart failure Mother    Heart failure Father    Breast cancer Cousin    Heart disease Brother    Hypertension Brother    Stroke Brother    Atrial fibrillation Brother    Healthy Son    Colon cancer Neg Hx    Colon polyps Neg Hx    Esophageal cancer Neg Hx    Rectal cancer Neg Hx    Stomach cancer Neg Hx    Social History   Tobacco Use   Smoking status: Never   Smokeless tobacco: Never  Vaping Use   Vaping status: Never Used  Substance Use Topics  Alcohol  use: Not Currently    Comment: glass of wine once a year   Drug use: No   Current Outpatient Medications  Medication Sig Dispense Refill   alendronate  (FOSAMAX ) 70 MG tablet Take 70 mg by mouth once a week. Take with a full glass of water  on an empty stomach.     Calcium Carb-Cholecalciferol (CALCIUM 600+D3) 600-20 MG-MCG TABS Take 1 tablet by mouth daily.     cholecalciferol (VITAMIN D) 1000 UNITS tablet Take 1,000 Units by mouth daily.     cyclobenzaprine  (FLEXERIL ) 5 MG tablet Take 5 mg by mouth at bedtime as needed.     Docosahexaenoic Acid (DHA OMEGA 3) 100 MG CAPS TAKE 2 EACH BY MOUTH DAILY. (Patient taking differently: Take 1 capsule by mouth daily.) 60 capsule 12   Fluocinolone Acetonide 0.01 % OIL Apply 1 Application topically in the morning and at bedtime.     Multiple Vitamin  (MULTIVITAMIN) capsule Take 1 capsule by mouth daily.     pantoprazole  (PROTONIX ) 20 MG tablet TAKE 1 TABLET BY MOUTH EVERY DAY 90 tablet 1   RESTASIS 0.05 % ophthalmic emulsion Place 1 drop into both eyes 2 (two) times daily.     tazarotene (AVAGE) 0.1 % cream Apply 1 application  topically at bedtime.     traMADol  (ULTRAM ) 50 MG tablet Take 1 tablet (50 mg total) by mouth every 6 (six) hours as needed. (Patient taking differently: Take 50 mg by mouth 2 (two) times daily.) 200 tablet 1   No current facility-administered medications for this visit.   Allergies  Allergen Reactions   Omeprazole  Rash    And headache     Review of Systems: All systems reviewed and negative except where noted in HPI.   Lab Results  Component Value Date   WBC 6.3 05/18/2024   HGB 11.7 (L) 05/18/2024   HCT 34.8 (L) 05/18/2024   MCV 92.1 05/18/2024   PLT 292 05/18/2024    Lab Results  Component Value Date   NA 140 08/23/2021   CL 101 08/23/2021   K 4.9 08/23/2021   CO2 25 08/23/2021   BUN 11 08/23/2021   CREATININE 0.76 08/23/2021   EGFR 82 08/23/2021   CALCIUM 10.3 08/23/2021   ALBUMIN 4.5 08/23/2021   GLUCOSE 79 08/23/2021    Lab Results  Component Value Date   ALT 12 08/23/2021   AST 21 08/23/2021   ALKPHOS 51 08/23/2021   BILITOT 0.5 08/23/2021    Lab Results  Component Value Date   IRON 101 10/01/2023   TIBC 316.4 10/01/2023   FERRITIN 31.1 10/01/2023    Lab Results  Component Value Date   VITAMINB12 1,029 (H) 10/01/2023     Physical Exam: BP 100/60   Pulse 80   Ht 5' 3 (1.6 m)   Wt 110 lb (49.9 kg)   BMI 19.49 kg/m  Constitutional: Pleasant,well-developed, female in no acute distress. Neurological: Alert and oriented to person place and time. Psychiatric: Normal mood and affect. Behavior is normal.   ASSESSMENT: 78 y.o. female here for assessment of the following  1. Autoimmune gastritis   2. Gastroesophageal reflux disease, unspecified whether esophagitis  present   3. Long-term current use of proton pump inhibitor therapy   4. Osteoporosis, unspecified osteoporosis type, unspecified pathological fracture presence    Reviewed prior endoscopies with her from 2022 and recently.  History of H. pylori that was eradicated.  Chronic gastritis persists along with positive antiparietal cell antibody, atrophic changes endoscopically.  Constellation of findings consistent with autoimmune gastritis.  We discussed what this is, risks for neuroendocrine tumor and gastric cancer moving forward.  Hopefully this risk overall remains low, she had no gastric intestinal metaplasia on any biopsies which is reassuring.  We discussed moving forward if she wanted surveillance of this.  If she had gastric intestinal metaplasia would consider a mapping study sooner, she did not have that, could consider repeat endoscopy in 3 years or so for surveillance.  At that time she will be in her early 58s.  She is otherwise in good health, she will consider a repeat EGD in a few years for surveillance, if any symptoms in the interim she should let us  know.  She should have B12 and iron levels checked annually.  We otherwise discussed long-term use of PPI.  Low-dose Protonix  has resolved her upper tract symptoms and she is feeling quite well on the regimen.  She has rare breakthrough.  We discussed long-term risks of PPI use, while her renal functions are normal, she does have osteoporosis.  We discussed increased risk for bone fracture on chronic PPI, use lowest dose needed to control symptoms or use of an alternative.  I think may be reasonable to try to wean to a lower dosing of Protonix  if possible, try every other day dosing for a few weeks and see how she tolerates it.  If she has frequent breakthrough of symptoms she will need to take it daily.  If she tolerates this could try taking a few days weekly, overall counseled her to use the lowest dose needed to control symptoms.  I recommend  she follow-up with her primary care to discuss follow-up DEXA scan for which she appears overdue on therapy for osteoporosis.  She is seeing her PCP in a few months.  I will see her in 1 year for reassessment in the office if not sooner with any issue.  She agrees.  PLAN: - continue protonix , discussed long term risks / benefits of chronic PPI  - wean protonix  to every other day and use lowest dose needed to control symptoms - check B12 / iron yearly with labs - discussed increased risks for carcinoid / gastric cancer and if she wanted surveillance EGD in a few years - can see me in one year for follow up / reassessment - talk with PCP about repeating DEXA scan on Fosamax  - no further surveillance colonoscopy exam due to age, she is in agreement  Marcey Naval, MD [facility]

## 2024-06-23 NOTE — Patient Instructions (Signed)
 Continue Protonix .  Begin to wean dosing to every other day.  Follow up in 1 year.  Thank you for entrusting me with your care and for choosing [facility], Dr. Elspeth Naval  _______________________________________________________  If your blood pressure at your visit was 140/90 or greater, please contact your primary care physician to follow up on this.  _______________________________________________________  If you are age 78 or older, your body mass index should be between 23-30. Your Body mass index is 19.49 kg/m. If this is out of the aforementioned range listed, please consider follow up with your Primary Care Provider.  If you are age 15 or younger, your body mass index should be between 19-25. Your Body mass index is 19.49 kg/m. If this is out of the aformentioned range listed, please consider follow up with your Primary Care Provider.   ________________________________________________________  The [facility] providers would like to encourage you to use MYCHART to communicate with providers for non-urgent requests or questions.  Due to long hold times on the telephone, sending your provider a message by [facility] may be a faster and more efficient way to get a response.  Please allow 48 business hours for a response.  Please remember that this is for non-urgent requests.  _______________________________________________________  [facility] is using a team-based approach to care.  Your team is made up of your doctor and two to three APPS. Our APPS (Nurse Practitioners and Physician Assistants) work with your physician to ensure care continuity for you. They are fully qualified to address your health concerns and develop a treatment plan. They communicate directly with your gastroenterologist to care for you. Seeing the Advanced Practice Practitioners on your physician's team can help you by facilitating care more promptly, often allowing for earlier  appointments, access to diagnostic testing, procedures, and other specialty referrals.

## 2024-07-26 ENCOUNTER — Ambulatory Visit (INDEPENDENT_AMBULATORY_CARE_PROVIDER_SITE_OTHER): Admitting: Podiatrist

## 2024-07-26 ENCOUNTER — Ambulatory Visit (INDEPENDENT_AMBULATORY_CARE_PROVIDER_SITE_OTHER)

## 2024-07-26 DIAGNOSIS — M778 Other enthesopathies, not elsewhere classified: Secondary | ICD-10-CM | POA: Diagnosis not present

## 2024-07-26 DIAGNOSIS — M7751 Other enthesopathy of right foot: Secondary | ICD-10-CM

## 2024-07-26 DIAGNOSIS — G5761 Lesion of plantar nerve, right lower limb: Secondary | ICD-10-CM

## 2024-07-26 NOTE — Progress Notes (Signed)
 Chief Complaint  Patient presents with   Foot Pain    right foot swelling in the morning, foot had surgery on in the past     HPI: Patient is 78 y.o. female who presents today for concerns as listed above.  Relates pain and swelling on the outside of the foot.  She previously had surgery on this foot.    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 Take with a full glass of water  on an empty stomach.     Calcium Carb-Cholecalciferol (CALCIUM 600+D3) 600-20 MG-MCG TABS Take 1 tablet by mouth daily.     cholecalciferol (VITAMIN D) 1000 UNITS tablet Take 1,000 Units by mouth daily.     cyclobenzaprine  (FLEXERIL ) 5 MG tablet Take 5 mg by mouth at bedtime as needed.     Docosahexaenoic Acid (DHA OMEGA 3) 100 MG CAPS TAKE 2 EACH BY MOUTH DAILY. (Patient taking differently: Take 1 capsule by mouth daily.) 60 capsule 12   Fluocinolone Acetonide 0.01 % OIL Apply 1 Application topically in the morning and at bedtime.     Multiple Vitamin (MULTIVITAMIN) capsule Take 1 capsule by mouth daily.     pantoprazole   (PROTONIX ) 20 MG tablet TAKE 1 TABLET BY MOUTH EVERY DAY 90 tablet 1   RESTASIS 0.05 % ophthalmic emulsion Place 1 drop into both eyes 2 (two) times daily.     tazarotene (AVAGE) 0.1 % cream Apply 1 application  topically at bedtime.     traMADol  (ULTRAM ) 50 MG tablet Take 1 tablet (50 mg total) by mouth every 6 (six) hours as needed. (Patient taking differently: Take 50 mg by mouth 2 (two) times daily.) 200 tablet 1   No current facility-administered medications on file prior to visit.    Allergies  Allergen Reactions   Omeprazole  Rash    And headache    Review of Systems No fevers, chills, nausea, muscle aches, no difficulty breathing, no calf pain, no chest pain or shortness of breath.   Physical Exam  GENERAL APPEARANCE: Alert, conversant. Appropriately groomed. No acute distress.   VASCULAR: Pedal pulses palpable 2/4 DP and PT bilateral.  Capillary refill time is immediate to all digits,  Proximal to distal cooling is warm to warm.  Digital perfusion adequate.   NEUROLOGIC: sensation is intact to 5.07 monofilament at 5/5 sites bilateral.  Light touch is intact bilateral, vibratory sensation intact bilateral  Pain third interspace of the right foot is noted.  Palpable click third interspace also present.  Medial to lateral compression also elicits discomfort.   MUSCULOSKELETAL: acceptable muscle strength, tone and stability bilateral.  No gross boney pedal deformities noted.  No pain, crepitus or  limitation noted with foot and ankle range of motion bilateral.   DERMATOLOGIC: skin is warm, supple, and dry.  Color, texture, and turgor of skin within normal limits.  No open wounds are noted.  No preulcerative lesions are seen.  Digital nails are asymptomatic.    Xray exam.  3 views of the right foot are obtained-  contracture of digits 2,3,4 noted.  Likely cyst formation in the fifth metatarsal head seen.  No acute osseous abnormailties are noted.    Assessment     ICD-10-CM    1. Capsulitis of foot, right  M77.8 DG Foot Complete Right    2. Morton neuroma, right  G57.61        Plan  Discussed exam findings and treatment options.  Recommended an injection and she agreed.  10mg  kenalog and 0.5% marcaine  was infiltrated in a sterile fashion and she tolerated well. Recommended a follow up in 1 month.

## 2024-08-23 ENCOUNTER — Ambulatory Visit: Admitting: Podiatry

## 2024-08-23 DIAGNOSIS — M65971 Unspecified synovitis and tenosynovitis, right ankle and foot: Secondary | ICD-10-CM

## 2024-08-23 NOTE — Progress Notes (Signed)
 She presents today states that the shot Dr. Atragen gave her lasted for a good month and now my toe has started to swell and hurt again as she refers to the second digit of the right foot.  Objective: Vital signs are stable alert oriented x 3 she does have swelling dorsally and plantarly at the level of the second metatarsophalangeal joint.  Rigid hammertoe deformity is most likely the cause of this symptomatology.  Assessment: Synovitis second metatarsal phalangeal joint.  Hammertoe deformity second digit right foot.  Plan: Discussed etiology pathology conservative versus surgical therapies.  Placed 5 mg of Kenalog with 5 mg of Marcaine  around the joint today tolerated procedure well without complications.  She tolerated the procedure well.  We will place her in a hammertoe regulator device and will also schedule her for orthotics with [facility].  Alexis Medina will construct her a pair of orthotics preferably something relatively soft like cork.  She will need a proximal met pad in the arch just proximal to the metatarsal heads and a cut out beneath the second metatarsal right foot in particular but make 1 for the left foot as well.

## 2024-09-03 ENCOUNTER — Ambulatory Visit

## 2024-09-03 NOTE — Progress Notes (Signed)
 Orthotics   Patient was present and evaluated for Custom molded foot orthotics. Patient will benefit from CFO's to provide total contact to BIL MLA's helping to balance and distribute body weight more evenly across BIL feet helping to reduce plantar pressure and pain. Orthotic will also encourage FF / RF alignment  Patient was scanned today and will return for fitting upon receipt

## 2024-11-08 ENCOUNTER — Other Ambulatory Visit: Payer: Self-pay | Admitting: Family Medicine

## 2024-11-08 DIAGNOSIS — Z1231 Encounter for screening mammogram for malignant neoplasm of breast: Secondary | ICD-10-CM

## 2024-11-09 ENCOUNTER — Ambulatory Visit: Admitting: Podiatry

## 2024-11-09 DIAGNOSIS — Q666 Other congenital valgus deformities of feet: Secondary | ICD-10-CM | POA: Diagnosis not present

## 2024-11-09 NOTE — Progress Notes (Signed)
 Orthotics are dispensed are functioning well and no acute complaints.  If any foot and ankle issues are in future she will come back and see me.

## 2024-11-29 ENCOUNTER — Encounter
